# Patient Record
Sex: Male | Born: 1940
Health system: Southern US, Community
[De-identification: ages and names within clinical notes are randomized; demographics above are authoritative.]

## PROBLEM LIST (undated history)

## (undated) DIAGNOSIS — M1990 Unspecified osteoarthritis, unspecified site: Secondary | ICD-10-CM

## (undated) DIAGNOSIS — M2550 Pain in unspecified joint: Secondary | ICD-10-CM

## (undated) DIAGNOSIS — I6529 Occlusion and stenosis of unspecified carotid artery: Secondary | ICD-10-CM

## (undated) DIAGNOSIS — I10 Essential (primary) hypertension: Secondary | ICD-10-CM

## (undated) DIAGNOSIS — I739 Peripheral vascular disease, unspecified: Secondary | ICD-10-CM

## (undated) DIAGNOSIS — I2510 Atherosclerotic heart disease of native coronary artery without angina pectoris: Secondary | ICD-10-CM

## (undated) DIAGNOSIS — IMO0002 Reserved for concepts with insufficient information to code with codable children: Secondary | ICD-10-CM

## (undated) DIAGNOSIS — E785 Hyperlipidemia, unspecified: Secondary | ICD-10-CM

## (undated) HISTORY — DX: Occlusion and stenosis of unspecified carotid artery: I65.29

## (undated) HISTORY — PX: FOOT SURGERY: SHX648

## (undated) HISTORY — DX: Pain in unspecified joint: M25.50

## (undated) HISTORY — DX: Peripheral vascular disease, unspecified: I73.9

## (undated) HISTORY — DX: Essential (primary) hypertension: I10

## (undated) HISTORY — DX: Atherosclerotic heart disease of native coronary artery without angina pectoris: I25.10

## (undated) HISTORY — PX: AMPUTATION: SHX166

## (undated) HISTORY — DX: Reserved for concepts with insufficient information to code with codable children: IMO0002

## (undated) HISTORY — DX: Unspecified osteoarthritis, unspecified site: M19.90

## (undated) HISTORY — DX: Hyperlipidemia, unspecified: E78.5

## (undated) HISTORY — PX: APPENDECTOMY: SHX54

---

## 1998-11-04 HISTORY — PX: CAROTID ENDARTERECTOMY: SUR193

## 2005-07-05 HISTORY — PX: PR VEIN BYPASS GRAFT,AORTO-FEM-POP: 35551

## 2005-07-16 ENCOUNTER — Inpatient Hospital Stay (HOSPITAL_COMMUNITY): Admission: RE | Admit: 2005-07-16 | Discharge: 2005-07-29 | Payer: Self-pay | Admitting: Vascular Surgery

## 2005-07-22 ENCOUNTER — Encounter (INDEPENDENT_AMBULATORY_CARE_PROVIDER_SITE_OTHER): Payer: Self-pay | Admitting: *Deleted

## 2006-09-20 IMAGING — CR DG CHEST 2V
2 series · 2 of 2 positions shown · non-contrast
Comparison: None.

CLINICAL DATA: Peripheral vascular disease.  Former smoker with 30 pack/year history.
 CHEST - 2 VIEW:

[view not recorded (1 of 2)]
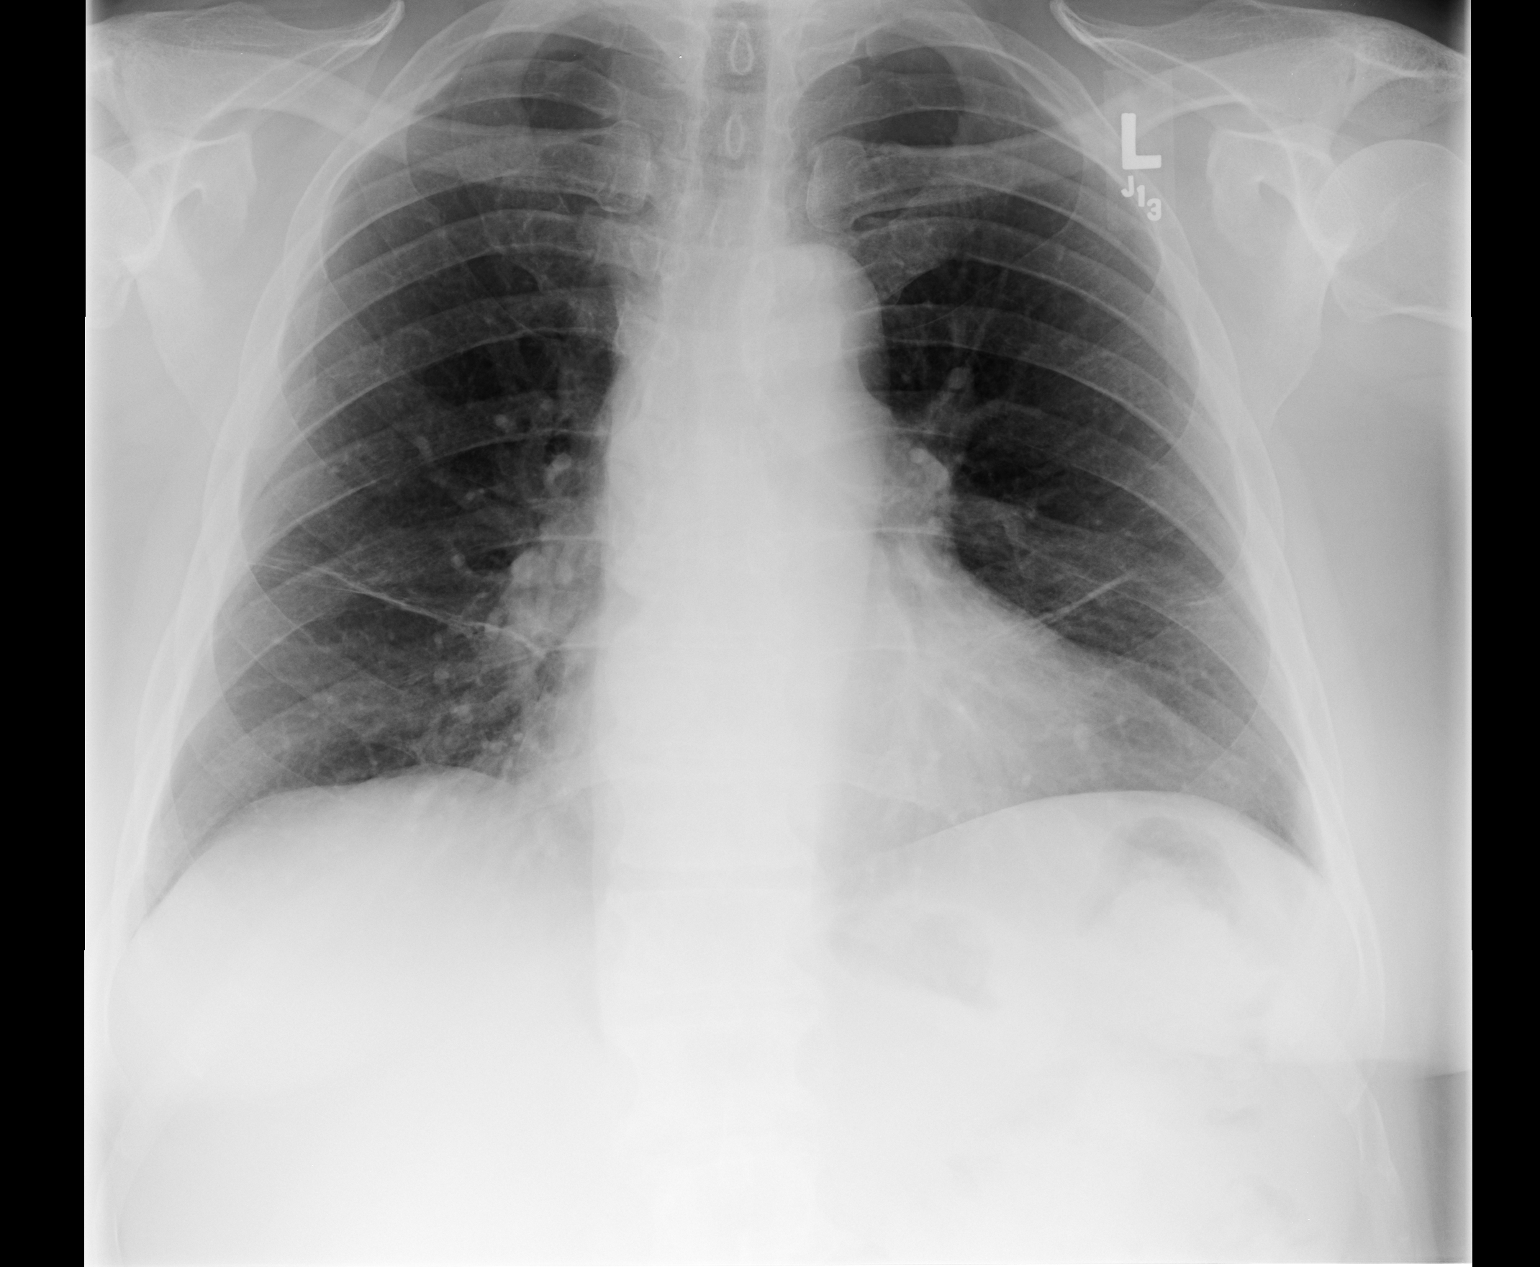

[view not recorded (2 of 2)]
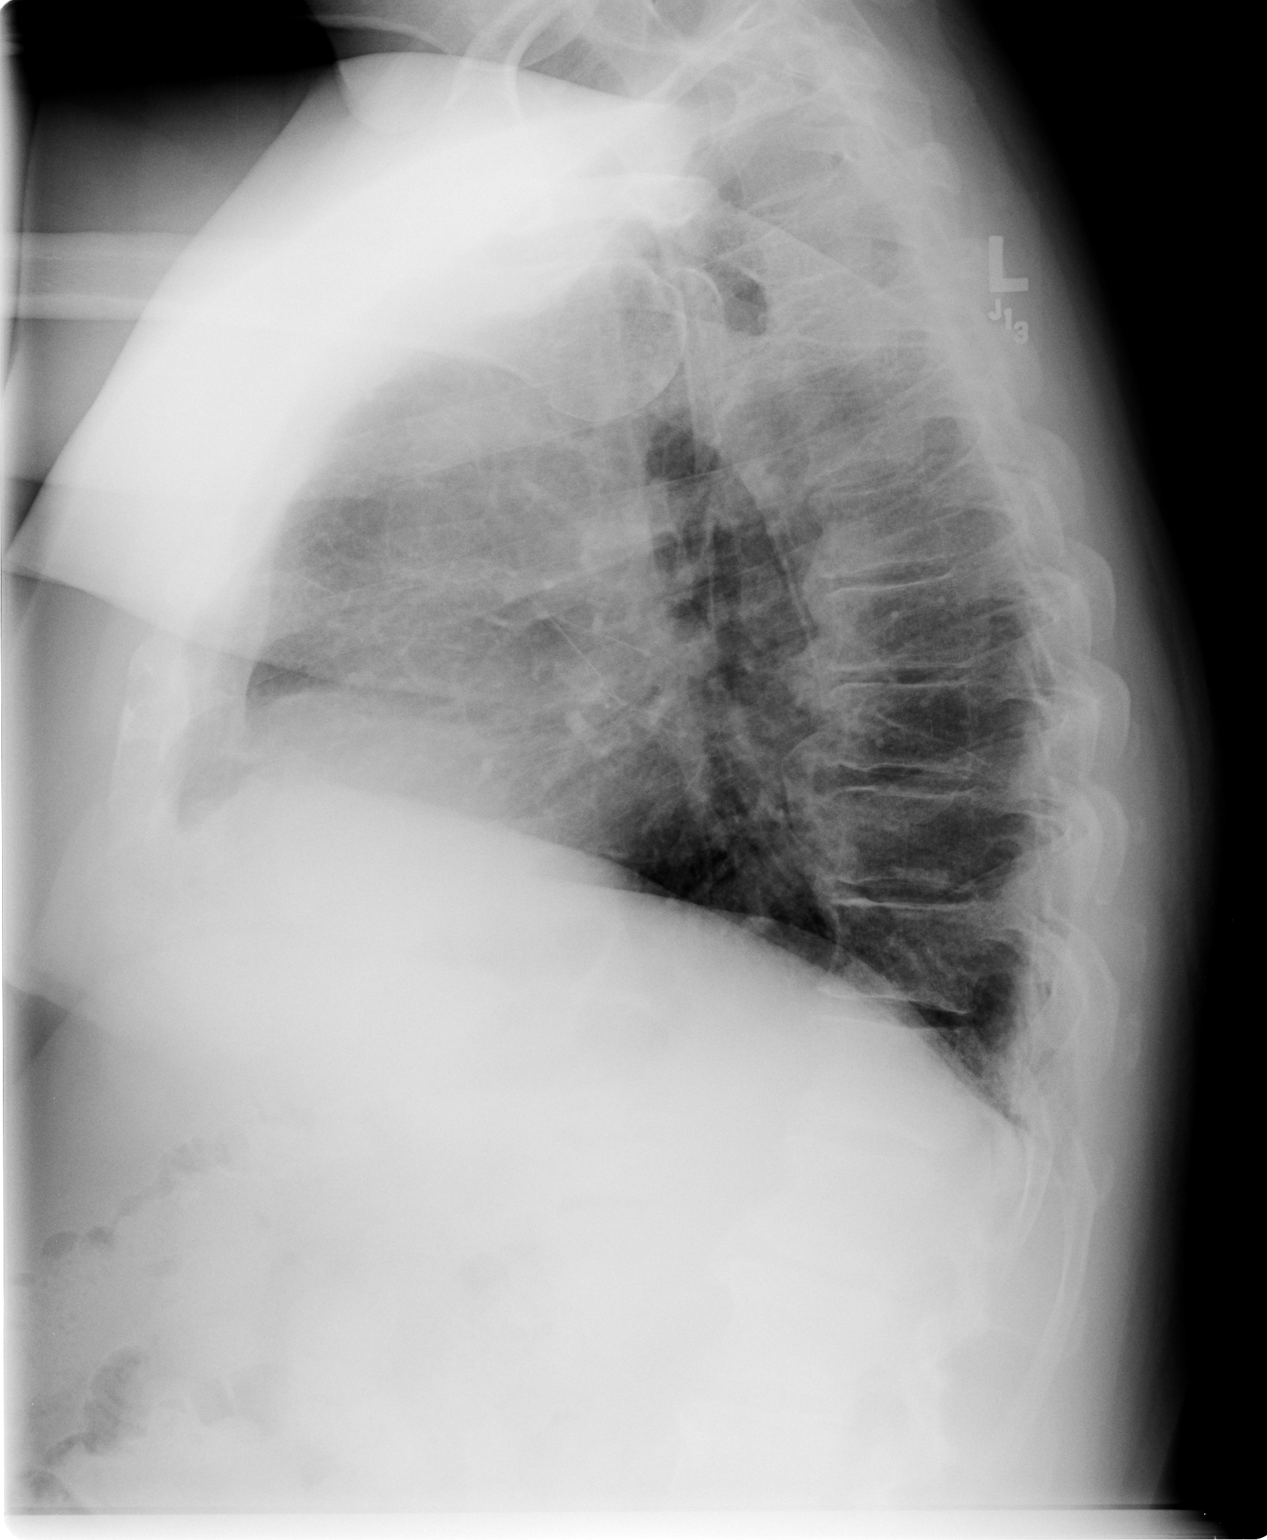

[2 of 2 positions shown; findings below may reference images not displayed]

FINDINGS: There is mild to moderate cardiomegaly.  No effusions and no pulmonary edema are noted.  Bilateral scar versus subsegmental atelectasis.  No air space opacities are noted.
IMPRESSION: 1.  Cardiomegaly ? no congestive heart failure.
 2.  Bilateral scar versus subsegmental atelectasis.

## 2007-05-04 ENCOUNTER — Ambulatory Visit: Payer: Self-pay | Admitting: Vascular Surgery

## 2007-07-15 ENCOUNTER — Ambulatory Visit: Payer: Self-pay | Admitting: Vascular Surgery

## 2008-08-17 ENCOUNTER — Ambulatory Visit: Payer: Self-pay | Admitting: Vascular Surgery

## 2008-08-24 ENCOUNTER — Ambulatory Visit: Payer: Self-pay | Admitting: Vascular Surgery

## 2008-08-26 ENCOUNTER — Ambulatory Visit (HOSPITAL_COMMUNITY): Admission: RE | Admit: 2008-08-26 | Discharge: 2008-08-26 | Payer: Self-pay | Admitting: Vascular Surgery

## 2008-08-26 ENCOUNTER — Ambulatory Visit: Payer: Self-pay | Admitting: Vascular Surgery

## 2008-09-01 ENCOUNTER — Encounter: Payer: Self-pay | Admitting: Vascular Surgery

## 2008-09-01 ENCOUNTER — Inpatient Hospital Stay (HOSPITAL_COMMUNITY): Admission: RE | Admit: 2008-09-01 | Discharge: 2008-09-03 | Payer: Self-pay | Admitting: Vascular Surgery

## 2008-09-02 ENCOUNTER — Encounter: Payer: Self-pay | Admitting: Vascular Surgery

## 2008-10-05 ENCOUNTER — Ambulatory Visit: Payer: Self-pay | Admitting: Vascular Surgery

## 2009-01-04 ENCOUNTER — Ambulatory Visit: Payer: Self-pay | Admitting: Vascular Surgery

## 2009-08-01 ENCOUNTER — Ambulatory Visit: Payer: Self-pay | Admitting: Vascular Surgery

## 2009-11-01 IMAGING — CR DG CHEST 2V
2 series · 2 of 2 positions shown · non-contrast
Comparison: 07/15/2005

CLINICAL DATA: Peripheral vascular disease.  Preoperative chest
radiograph.

CHEST - 2 VIEW

[view not recorded (1 of 2)]
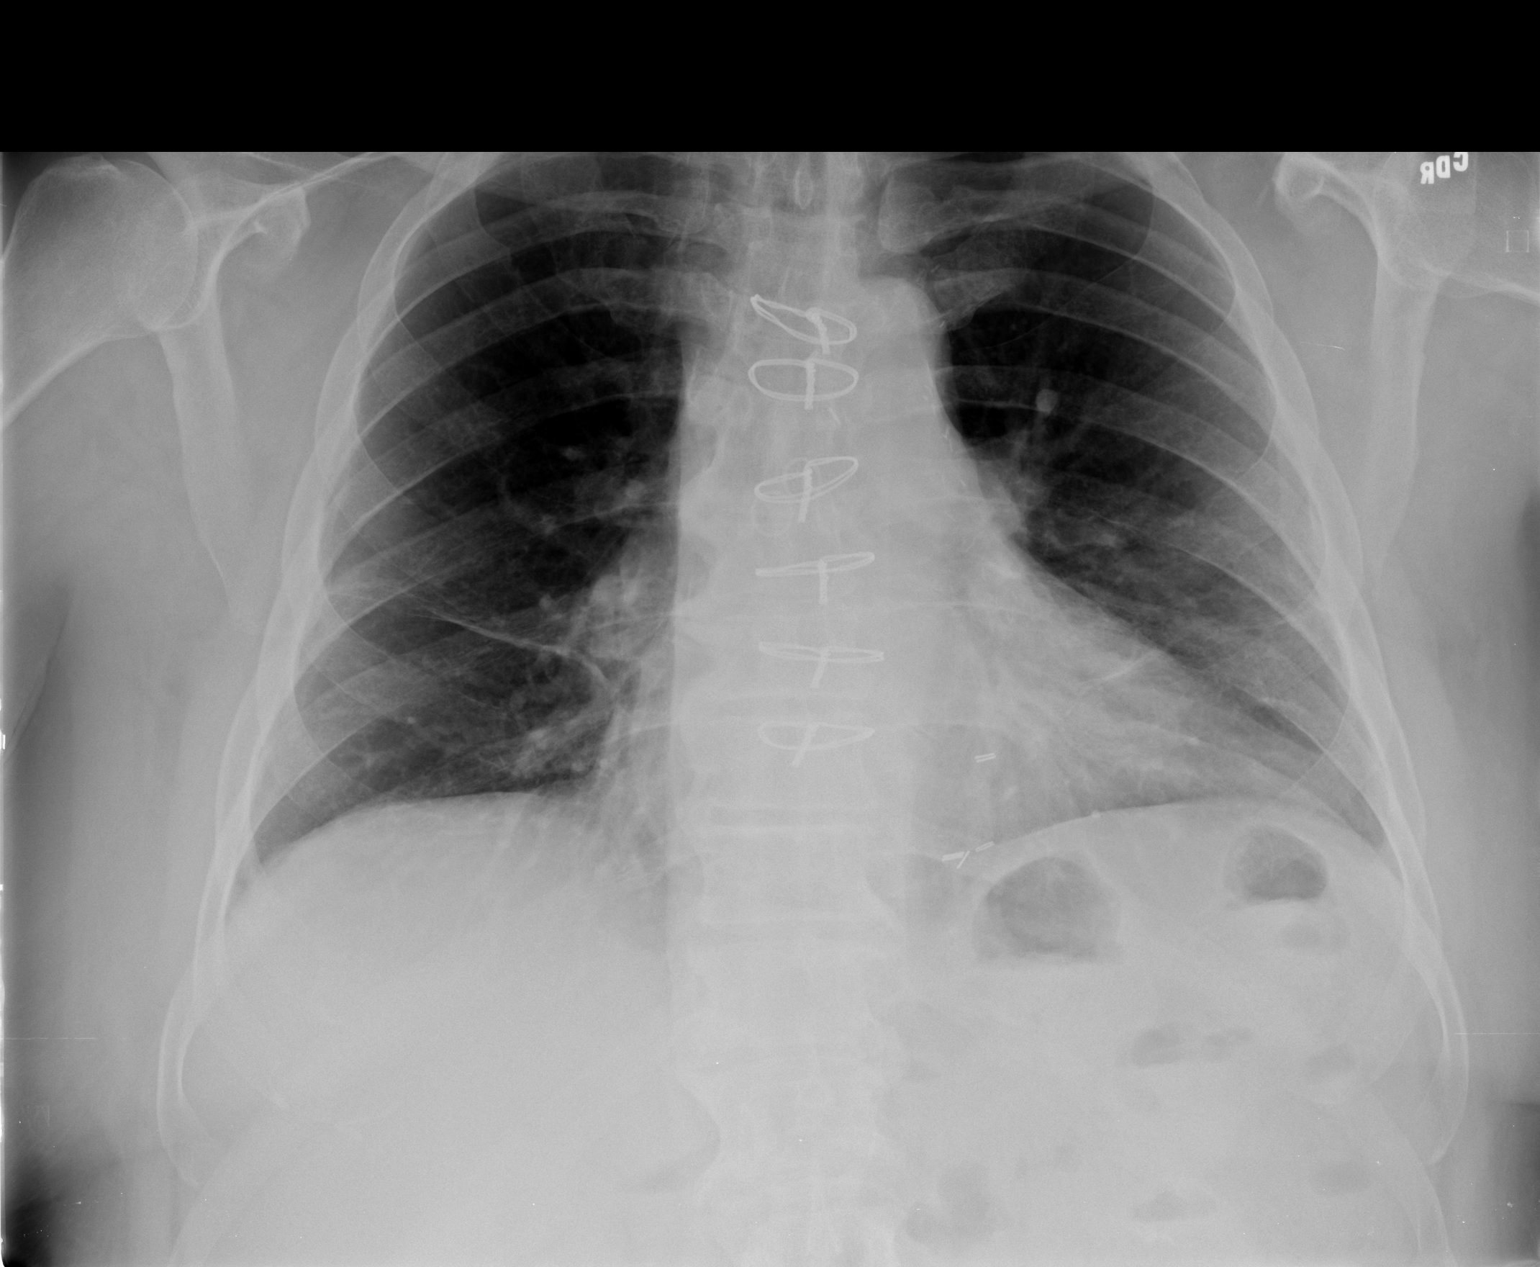

[view not recorded (2 of 2)]
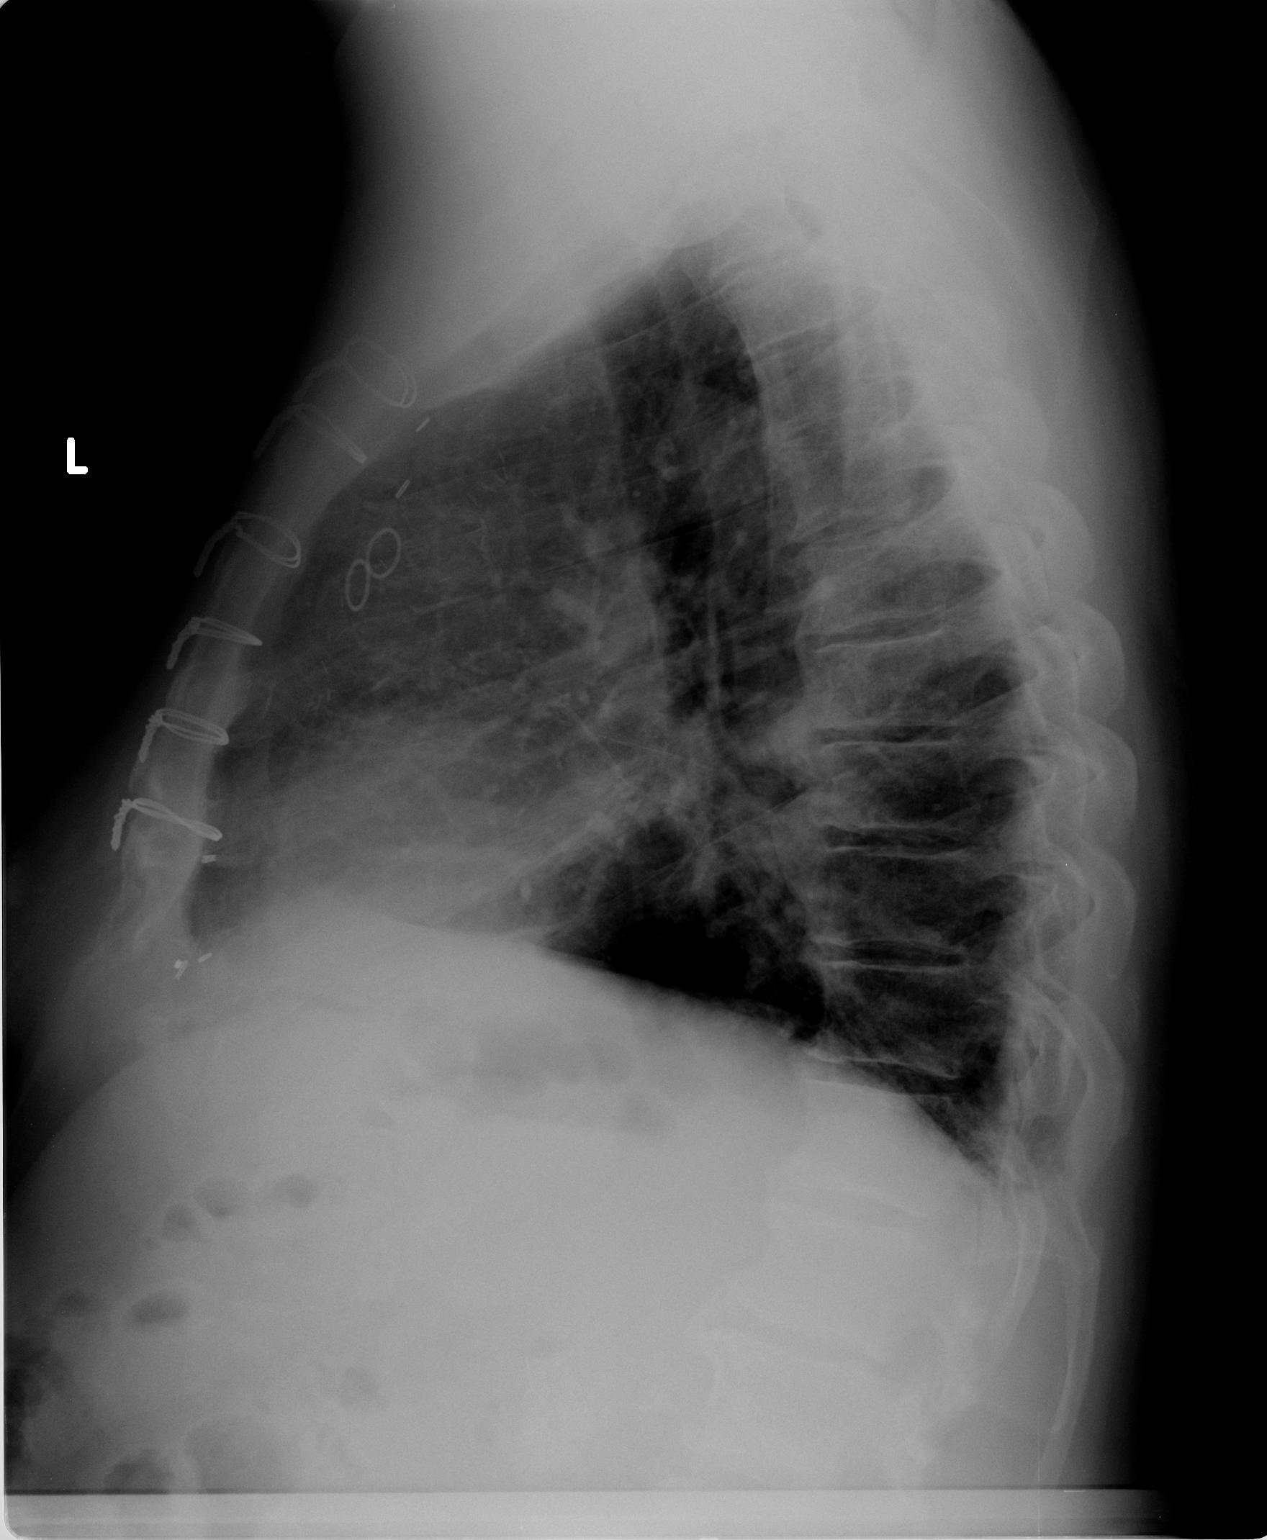

[2 of 2 positions shown; findings below may reference images not displayed]

FINDINGS: CABG/median sternotomy.  Bilateral basilar scarring.  No
change from prior examination.  Mild cardiomegaly.  No edema or
failure.  No airspace disease or effusions.  Surgical clips over
the left anterior chest, possibly related to harvesting of left
internal mammary artery. Diffuse idiopathic skeletal hyperostosis
of the thoracic spine.
IMPRESSION: 1.  Mild cardiomegaly.
2.  Chronic scarring in the lung bases.
3.  No acute cardiopulmonary disease.

## 2010-01-10 ENCOUNTER — Ambulatory Visit: Payer: Self-pay | Admitting: Vascular Surgery

## 2010-08-22 ENCOUNTER — Ambulatory Visit: Payer: Self-pay | Admitting: Vascular Surgery

## 2011-03-19 NOTE — Procedures (Signed)
CAROTID DUPLEX EXAM   INDICATION:  Follow-up evaluation of known carotid artery disease.   HISTORY:  Diabetes:  Yes.  Cardiac:  Coronary artery bypass graft in 09/2006.  Hypertension:  Medically controlled.  Smoking:  Former smoker.  Previous Surgery:  Left carotid endarterectomy in 2000 in [place].  CV History:  Previous duplex performed on 05/04/2007 revealed 20-39%  right ICA stenosis and no left ICA stenosis, status post endarterectomy.  Amaurosis Fugax No, Paresthesias No, Hemiparesis No.                                       RIGHT             LEFT  Brachial systolic pressure:         198               196  Brachial Doppler waveforms:         Triphasic         Triphasic  Vertebral direction of flow:        Antegrade         Antegrade  DUPLEX VELOCITIES (cm/sec)  CCA peak systolic                   82                81  ECA peak systolic                   119               [id number]  ICA peak systolic                   112               86  ICA end diastolic                   27                19  PLAQUE MORPHOLOGY:                  Calcified, irregular                Mixed  PLAQUE AMOUNT:                      Mild-to-moderate  Mild  PLAQUE LOCATION:                    Proximal ICA      ECA   IMPRESSION:  1. 20-39% right internal carotid artery stenosis.  2. No left internal carotid artery stenosis, status post      endarterectomy.   ___________________________________________  [name] Fields, MD   MC/MEDQ  D:  08/17/2008  T:  08/17/2008  Job:  [id number]

## 2011-03-19 NOTE — Assessment & Plan Note (Signed)
OFFICE VISIT   [name], [name]  DOB:  11-26-1940                                       07/15/2007  CHART#:[id number]   Patient returns for followup today after his left superficial femoral  artery to dorsalis pedis bypass and amputation of his first toe.  He  continues to do well.  He has had no problems with his foot since his  last visit in September, 2007.  He has also previously had a left  carotid endarterectomy and has had no symptoms of TIA, amaurosis, or  stroke.  Of note, in the past year, he did have coronary artery bypass  grafting by Dr. Jerelene Redden and recovered well from this.   PHYSICAL EXAMINATION:  On physical exam today, blood pressure is 200/84.  Heart rate is 76 and regular.  He has a 2+ graft pulse from the knee all  the way down to the dorsum of the foot.   Recent ABIs in June, 2008 were 0.98 on the left and 0.8 on the right.  He did still have some increased velocities in the graft of 257 cm/sec,  proximal to the proximal anastomosis and 325 cm/sec in the distal  segment.  However, the pulse is very good throughout the graft and do  not believe he just warrants arteriography at this point and this has  been stable for over a year.  His toe amputation site is well healed.  Overall, patient is doing well.  He will continue to follow up in our  surveillance protocol.  He will return to see me in six months time.  He  probably needs a carotid duplex in about 5-6 years.   [name]. Fields, MD  Electronically Signed   CEF/MEDQ  D:  07/15/2007  T:  07/16/2007  Job:  [id number]   cc:   Dr. Carlus Pavlov

## 2011-03-19 NOTE — Assessment & Plan Note (Signed)
OFFICE VISIT   [name], [name]  DOB:  Dec 13, 1940                                       08/24/2008  CHART#:[id number]   The patient returns for follow-up today.  He previously underwent a left  first toe amputation and left superficial femoral to dorsalis pedis  artery bypass graft in September 2006.  He was noted to have increased  velocities in his left superficial femoral artery showed evidence of  stenosis in the superficial femoral artery above the proximal  anastomosis with a peak systolic velocity of 123XX123 cm/s.  His ABIs on that  exam were 0.98 on the left and 0.80 on the right.  The patient denies  any symptoms in his foot.  He has no numbness, tingling, no evidence of  nonhealing wounds.   MEDICATIONS:  Glyburide/metformin 5/500 twice a day, carvedilol 12.5 mg  twice a day, lisinopril 40 mg once a day, Crestor 40 mg once a day,  aspirin 325 mg once a day.   His atherosclerotic risk factors continue to include diabetes and former  smoking history but he quit 15 years ago.  He also has a history of  hypertension and elevated cholesterol.   FAMILY HISTORY:  Unremarkable.   SOCIAL HISTORY:  He is recently retired.  He does not consume alcohol  regularly.   PAST SURGICAL HISTORY:  He had a bunionectomy of his left first toe.  He  also had appendectomy and left carotid endarterectomy 6 years ago.  Carotid duplex exam 05/04/2007 showed no evidence of restenosis on the  left and a less than 40% right internal carotid artery stenosis.   PHYSICAL EXAMINATION:  Vitals:  BP 184/90 in the left arm, 209/82 in the  right arm, pulse 63 and regular.  HEENT:  Unremarkable.  He has 2+  carotid pulses without bruit.  Chest:  Clear to auscultation.  Cardiac  Exam:  Regular rate and rhythm.  Abdomen:  Soft, nontender without mass.  Extremities:  He has a 2+ dorsalis pedis pulse in the left foot, he has  a palpable graft pulse throughout the left lower extremity.  He  has 2+  femoral pulses bilaterally.  Right foot is pink, warm and well-perfused.  He has a 2+ right popliteal pulse.   I discussed with the patient today that we should do an arteriogram  because these high increase in velocities could indicate that he has a  high-grade stenosis above or involving his bypass graft.  We have  scheduled his arteriogram for October 23rd.  Also discussed with him the  possibility of angioplasty and stenting, or possible surgical revision  may be required.  Risks, benefits, possible complications and procedure  details were explained to the patient including, but not limited to,  bleeding, infection, contrast reaction, arterial injury; he understands  and agrees to proceed.  This is scheduled for 08/26/2008. I also  discussed with the patient that he should stop his metformin, with the  last dose being taking on Thursday morning and no further her doses  until Monday after the test.   [name]. Fields, MD  Electronically Signed   CEF/MEDQ  D:  08/24/2008  T:  08/25/2008  Job:  [id number]

## 2011-03-19 NOTE — Op Note (Signed)
NAMETYBERIUS, HLADKY NO.:  [id number]   MEDICAL RECORD NO.:  [id number]          PATIENT TYPE:  INP   LOCATION:  2024                         FACILITY:  [facility]   PHYSICIAN:  [name]. Fields, MD  DATE OF BIRTH:  14-May-1941   DATE OF PROCEDURE:  09/01/2008  DATE OF DISCHARGE:                               OPERATIVE REPORT   PROCEDURES:  1. Left superficial femoral artery endarterectomy.  2. Patch angioplasty, left superficial femoral artery with bovine      pericardial patch.   PREOPERATIVE DIAGNOSIS:  High-grade left superficial femoral artery  stenosis.   POSTOPERATIVE DIAGNOSIS:  High-grade left superficial femoral artery  stenosis.   ANESTHESIA:  General.   ASSISTANT:  Chad Cordial, PA-C   INDICATIONS:  The patient is a 70 year old male with prior history of a  left superficial femoral artery to dorsalis pedis artery bypass several  years ago.  On routine graft surveillance, he was noted to have a high-  grade stenosis of his left superficial femoral artery just above the  level of the anastomosis.  Preoperative arteriogram also confirmed that  he had a high-grade left superficial femoral artery stenosis and there  was also a high-grade stenosis of the proximal aspect of the vein graft.  He presents for revision of his bypass graft in order to preserve  patency of the bypass.   OPERATIVE FINDINGS:  1. Multiple high-grade stenoses in areas of left distal superficial      femoral artery.  2. Bovine pericardial patch, left superficial femoral artery.   OPERATIVE DETAILS:  After obtaining informed consent, the patient was  taken to the operating.  The patient was placed in supine position upon  the operating table.  After induction of general anesthesia and  endotracheal intubation, a Foley catheter was placed.  Next, the  patient's left and right lower extremities were prepped and draped in  the usual sterile fashion all the way to the toes.  A  longitudinal  incision was then made through a preexisting scar on the medial aspect  of the left thigh.  Incision was carried down through subcutaneous  tissues and a preexisting bypass graft was encountered.  This was  pulsatile in character.  The vein graft was dissected free  circumferentially and dissection was carried all the way up to the level  of the native superficial femoral artery vein graft anastomosis.  Superficial femoral artery was dissected free circumferentially above  and below the level of the anastomosis.  The distal superficial femoral  artery below the vein graft was palpated and found to be thickened with  atherosclerosis.  There was no pulse within this and presumably  occluded.  A vessel loop was placed around it for vascular control.  Next, dissection was carried out around the native left superficial  femoral artery.  There was a large amount of atherosclerotic plaque  within this consistent with arteriographic findings.  This was dissected  free up above the level of the adductor hiatus and up to a more normal  soft appearing and normal feeling  superficial femoral artery  approximately 10 cm above the area of the proximal anastomosis for the  vein graft.  Next, the patient was given 7000 units of intravenous  heparin.  The distal superficial femoral artery was controlled with a  vessel loop.  Proximal superficial femoral artery was controlled with  peripheral DeBakey clamp.  Several side branches were ligated and  divided between silk ties along the course of superficial femoral  artery.  The vein graft was controlled with a fine bulldog clamp.  Next,  a longitudinal opening was made in the hood of the vein graft and  extended down onto the superficial femoral artery.  There was a high-  grade stenosis of the proximal anastomosis with intimal hyperplasia and  there was atherosclerotic plaque extending into the superficial femoral  artery.  This longitudinal  opening was extended for approximately 10 cm  into the superficial femoral artery.  There are several areas of high-  grade stenosis and plaque ulceration.  At this point, a endarterectomy  was begun in the superficial femoral artery and a suitable plane.  This  was carried all the way down to the level of the bypass graft  anastomosis.  Plaque was removed and sent as a specimen.  A good clean  proximal endpoint was obtained.  A good distal endpoint was also  obtained.  Next, a bovine pericardial patch was brought up in the  operative field.  The distal end of this was sewn on as a patch  angioplasty extending onto the hood of the proximal anastomosis of the  bypass graft.  The patch was then extended up onto the native  superficial femoral artery.  Just prior to completion of anastomosis,  this was fore bled, back bled, and thoroughly flushed.  Anastomosis was  secured and clamps were released.  There was good pulsatile flow in the  native superficial femoral artery and into the bypass graft and also at  the level of the foot and the bypass graft in the left leg.  Next,  hemostasis was obtained with full reversal of the heparin with 70 mg of  protamine.  Thrombin and Gelfoam was also used for assistance in  hemostasis.  Next, the deep layers were closed with multiple layers of  running 2-0 and 3-0 Vicryl suture.  After, 4-0 Vicryl subcuticular  stitch was placed in the skin.  The patient tolerated the procedure well  and there were no complications.  Instrument, sponge, and needle counts  were correct at the end of the case.  The patient was taken to recovery  room in stable condition.      [name]. Fields, MD  Electronically Signed     CEF/MEDQ  D:  09/02/2008  T:  09/03/2008  Job:  [id number]

## 2011-03-19 NOTE — Procedures (Signed)
BYPASS GRAFT EVALUATION   INDICATION:  Follow-up evaluation of left leg bypass graft.   HISTORY:  Diabetes:  Yes.  Cardiac:  Coronary artery bypass graft in 09/2006.  Hypertension:  Medically controlled.  Smoking:  Patient is a former smoker.  Previous Surgery:  Left superficial femoral artery to dorsalis pedis  bypass graft with nonreversed saphenous vein on 07/17/2005 by Dr.  Oneida Alar.   SINGLE LEVEL ARTERIAL EXAM                               RIGHT              LEFT  Brachial:                    198                196  Anterior tibial:             178                188  Posterior tibial:            92  Peroneal:                                       148  Ankle/brachial index:        0.89               0.95   PREVIOUS ABI:  Date: 05/04/07  RIGHT:  0.80  LEFT:  0.98   LOWER EXTREMITY BYPASS GRAFT DUPLEX EXAM:   DUPLEX:  Doppler arterial waveforms are biphasic proximal to, within,  and distal to the superficial femoral to DP bypass graft.  There is an  elevated peak systolic velocity in the superficial femoral artery  proximal to the anastomosis of 508 cm/s.   IMPRESSION:  1. Ankle brachial indices are stable from previous study bilaterally.  2. Patent left superficial femoral to dorsalis pedis artery bypass      graft.  3. Left superficial femoral artery stenosis just proximal to the      anastomosis of the graft.   ___________________________________________  [name]. Fields, MD   MC/MEDQ  D:  08/17/2008  T:  08/17/2008  Job:  [id number]

## 2011-03-19 NOTE — Procedures (Signed)
BYPASS GRAFT EVALUATION   INDICATION:  Left lower extremity bypass graft.   HISTORY:  Diabetes:  Yes.  Cardiac:  CABG.  Hypertension:  Yes.  Smoking:  Previous.  Previous Surgery:  Left mid superficial femoral to dorsalis pedis bypass  graft on 07/17/2005, left superficial femoral artery endarterectomy on  09/01/2008.   SINGLE LEVEL ARTERIAL EXAM                               RIGHT              LEFT  Brachial:                    205                203  Anterior tibial:             162                212  Posterior tibial:            175                209  Peroneal:  Ankle/brachial index:        0.85               1.03   PREVIOUS ABI:  Date: 01/10/2010  RIGHT:  0.9  LEFT:  1.15   LOWER EXTREMITY BYPASS GRAFT DUPLEX EXAM:   DUPLEX:  Biphasic Doppler waveforms noted throughout the left lower  extremity bypass graft in its native vessels with mildly increased  velocity of 210 cm/s noted at the left distal bypass graft level near  the ankle.   IMPRESSION:  1. Patent left superficial femoral to dorsalis pedis artery bypass      graft and superficial femoral artery endarterectomy site with      increased velocity noted, as described above.  2. Stable bilateral ankle brachial indices.   ___________________________________________  [name] Fields, MD   CH/MEDQ  D:  08/22/2010  T:  08/22/2010  Job:  [id number]

## 2011-03-19 NOTE — Assessment & Plan Note (Signed)
OFFICE VISIT   [name], [name]  DOB:  04/15/1941                                       10/05/2008  CHART#:[id number]   The patient returns for follow-up today.  He underwent endarterectomy of  his left superficial femoral artery on October 29th.  This was done  above an area of previous SFA to dorsalis pedis artery bypass.  On exam  today, the incision is well-healed.  He has an easily palpable 2+  dorsalis pedis pulse in the foot.  Overall, he appears to be doing well.  He will follow up in our graft surveillance protocol.   [name]. Fields, MD  Electronically Signed   CEF/MEDQ  D:  10/12/2008  T:  10/13/2008  Job:  [id number]

## 2011-03-19 NOTE — Procedures (Signed)
BYPASS GRAFT EVALUATION   INDICATION:  Followup left lower extremity revascularization.   HISTORY:  Diabetes:  Yes.  Cardiac:  CABG.  Hypertension:  Yes.  Smoking:  Quit.  Previous Surgery:  Left SFA-DPA bypass graft on 07/17/2005 by Dr.  Oneida Alar.  Left SFA endarterectomy with BPP 09/01/2008 by Dr. Oneida Alar.   SINGLE LEVEL ARTERIAL EXAM                               RIGHT              LEFT  Brachial:                    162                163  Anterior tibial:             145                189  Posterior tibial:            146                186  Peroneal:  Ankle/brachial index:        0.90               1.16   PREVIOUS ABI:  Date:  10/05/2008  RIGHT:  0.92  LEFT:  1.04   LOWER EXTREMITY BYPASS GRAFT DUPLEX EXAM:   DUPLEX:  Patent left superficial femoral artery to dorsalis pedis artery  bypass graft and SFA endarterectomy site.  Elevated velocities in focal area of distal graft appears stable from  previous studies.   IMPRESSION:  1. Stable ABIs bilaterally.  2. Patent left SFA to DPA bypass graft as well as SFA endarterectomy      site.       ___________________________________________  [name]. Fields, MD   AS/MEDQ  D:  01/04/2009  T:  01/04/2009  Job:  [id number]

## 2011-03-19 NOTE — Procedures (Signed)
CAROTID DUPLEX EXAM   INDICATION:  Follow up carotid artery disease.   HISTORY:  Diabetes:  Yes  Cardiac:  CABG November 2007  Hypertension:  On medications.  Smoking:  Quit.  Previous Surgery:  Status post left CVA.  CV History:  No.  Amaurosis Fugax: Paresthesias No, Hemiparesis No   TECHNOLOGIST:  Marcos Eke, BS, RVT.   INTERPRETING DOCTOR:  [name]. Fields, MD                                       RIGHT               LEFT  Brachial systolic pressure:         169                 184  Brachial Doppler waveforms:         Biphasic.           Biphasic.  Vertebral direction of flow:        Antegrade.          Antegrade.  DUPLEX VELOCITIES (cm/sec)  CCA peak systolic                   72.                 88.  ECA peak systolic                   154.                176.  ICA peak systolic                   88.                 75.  ICA end diastolic                   25.                 24.  PLAQUE MORPHOLOGY:                  Calcified.          Mixed.  PLAQUE AMOUNT:                      Mild.               Mild.  PLAQUE LOCATION:                    ICA/ECA/bifurcation.                  ECA.   IMPRESSION:  The right ICA shows evidence of 20-39% stenosis.   The left ICA shows no evidence of restenosis status post CEA.   Bilateral minimal ECA stenosis.   No significant changes from previous study.   ___________________________________________  [name] Oneida Alar, MD   AS/MEDQ  D:  05/04/2007  T:  05/04/2007  Job:  [id number]

## 2011-03-19 NOTE — Procedures (Signed)
BYPASS GRAFT EVALUATION   INDICATION:  Follow up left lower extremity bypass graft.   HISTORY:  Diabetes:  Yes.  Cardiac:  CABG.  Hypertension:  Yes.  Smoking:  Previous.  Previous Surgery:  Left SFA to DPA bypass graft on 07/17/05 by Dr.  Oneida Alar.  Left SFA endarterectomy, 09/01/08 by Dr. Oneida Alar.   SINGLE LEVEL ARTERIAL EXAM                               RIGHT              LEFT  Brachial:                    175                163  Anterior tibial:             142                201  Posterior tibial:            158                164  Peroneal:  Ankle/brachial index:        0.9                1.15   PREVIOUS ABI:  Date: 08/01/09  RIGHT:  0.93  LEFT:  1.16   LOWER EXTREMITY BYPASS GRAFT DUPLEX EXAM:   DUPLEX:  1. Patent left SFA to DPA bypass graft and SFA endarterectomy site.  2. Focal area of distal graft with elevated velocities.  Have      increased to 327 cm/s today.   IMPRESSION:  1. Patent left superficial femoral artery to dorsalis pedis artery      bypass graft as well as superficial femoral artery endarterectomy      site.  2. Stable ankle brachial indices bilaterally.   Dr. Oneida Alar informed of velocity changes and stated to just keep on 6-  month protocol.   ___________________________________________  [name]. Fields, MD   AS/MEDQ  D:  01/10/2010  T:  01/10/2010  Job:  [id number]

## 2011-03-19 NOTE — Procedures (Signed)
BYPASS GRAFT EVALUATION   INDICATION:  Follow up left lower extremity bypass graft.   HISTORY:  Diabetes:  Yes  Cardiac:  CABG  Hypertension:  Yes  Smoking:  Previous  Previous Surgery:  Left SFA to DPA bypass graft on 07/17/2005 by Dr.  Oneida Alar.  Left SFA endarterectomy 09/01/2008 by Dr. Oneida Alar.   SINGLE LEVEL ARTERIAL EXAM                               RIGHT              LEFT  Brachial:                    184                181  Anterior tibial:             166                213  Posterior tibial:            172                202  Peroneal:  Ankle/brachial index:        0.93               1.16   PREVIOUS ABI:  Date: 01/04/2009  RIGHT:  0.90  LEFT:  1.16   LOWER EXTREMITY BYPASS GRAFT DUPLEX EXAM:   DUPLEX:  1. Patent left superficial femoral artery to dorsalis pedis artery      bypass graft and SFA endarterectomy site.  2. Stable elevated velocities in focal area of distal graft of 247      cm/s.   IMPRESSION:  1. Patent left superficial femoral artery to dorsalis pedis artery      bypass graft as well as superficial femoral artery endarterectomy      site.  2. Stable ankle-brachial indices bilaterally.  3. No significant changes from previous study.   ___________________________________________  [name] Fields, MD   AS/MEDQ  D:  08/01/2009  T:  08/02/2009  Job:  [id number]

## 2011-03-19 NOTE — Op Note (Signed)
NAMEGLENDA, MCLOONE                   ACCOUNT NO.:  [id number]   MEDICAL RECORD NO.:  [id number]          PATIENT TYPE:  AMB   LOCATION:  SDS                          FACILITY:  [facility]   PHYSICIAN:  Charles E. Fields, MD  DATE OF BIRTH:  1941/06/06   DATE OF PROCEDURE:  08/26/2008  DATE OF DISCHARGE:  08/26/2008                               OPERATIVE REPORT   PROCEDURE:  Aortogram with left lower extremity runoff.   PREOPERATIVE DIAGNOSIS:  Left superficial femoral artery stenosis.   POSTOPERATIVE DIAGNOSIS:  Left superficial femoral artery stenosis.   ANESTHESIA:  Local.   OPERATIVE DETAILS:  After obtaining informed consent, the patient taken  to the [facility] lab.  The patient placed in supine position on the angio  table.  Both groins were prepped and draped in usual sterile fashion.  Local anesthesia was infiltrated over the right common femoral artery.  Attempt was made to cannulate the right common femoral artery.  The  guidewire would not advance initially, although there was good pulsatile  with return of blood flow.  This was examined under fluoroscopy and a  stick was found to be fairly low most likely in the superficial femoral  or profunda femoris artery.  Therefore, the needle was pulled back and  out and direct pressure was held for approximately 5 minutes over the  area of the needle stick site.  Next, an additional attempt was made to  cannulate the right common femoral artery with marking of the femoral  head prior to this.  I was able to successfully cannulate the right  common femoral artery and a 0.035 Wholey wire was advanced into the  abdominal aorta under fluoroscopic guidance.  Next, a 5-French sheath  was placed over the guidewire in the right common femoral artery.  This  was thoroughly flushed with heparinized saline.  A 5-French pigtail  catheter was then placed over the guidewire into the abdominal aorta and  abdominal aortogram was obtained.  This shows  bilateral duplicated renal  arteries with no significant stenosis.  There are actually three renal  arteries to the right kidney and there are two renal arteries to the  left kidney.  There is no significant stenosis in any of these.   Infrarenal abdominal aorta was without flow-limiting stenosis.  The left  and right common iliac arteries are without flow-limiting stenosis.  Left and right internal, external iliac arteries are without flow-  limiting stenosis.  Next, a pigtail catheter was pulled back over a  guidewire.  A 5-French crossover catheter was then placed into the  abdominal aorta and this was used to selectively catheterize the left  common iliac artery.  A 0.035 angled Glidewire was then advanced down  into the distal external iliac artery on the left side.  The crossover  catheter was pulled back over the guidewire and a 5-French end-hole  catheter placed over the guidewire into the distal left external iliac  artery.  Left lower extremity arteriogram was then obtained.  Also,  lateral foot view and an oblique view of the midthigh  of the left leg  was obtained.  This shows a patent left common femoral artery, profunda  femoris artery, and superficial femoral artery.  However, there is a  high-grade stenosis of the distal superficial femoral artery right above  the takeoff of the bypass graft which extends from the superficial  femoral artery to the dorsalis pedis artery.  The vein graft is widely  patent throughout its course.  The distal anastomosis is to the dorsalis  pedis artery at the top of the foot.  Distal anastomosis is widely  patent on the lateral foot view.   An oblique view of the thigh was obtained to further examine the native  superficial femoral artery.  Again, there is a 95% stenosis of the  superficial femoral artery just above the proximal anastomosis of the  bypass.  The more proximal superficial femoral artery approximately 10-  12 cm above the  anastomosis is relatively disease free.  The left common  femoral artery is also relatively disease free.   Next, the end-hole catheter was pulled back over a guidewire.  The  sheath was left in place to be pulled in the holding area.  The patient  tolerated the procedure well and there were no complications.  The  patient was taken to the holding area in stable condition.   OPERATIVE FINDINGS:  1. Aortoiliac occlusive disease.  2. Right renal arteries to the left renal arteries with no significant      stenosis.  3. Patent left superficial femoral to dorsalis pedis bypass __________      superficial femoral artery just proximal to the anastomosis.  4. 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      [name]. Fields, MD  Electronically Signed     CEF/MEDQ  D:  08/26/2008  T:  08/27/2008  Job:  [id number]

## 2011-03-19 NOTE — Procedures (Signed)
BYPASS GRAFT EVALUATION   INDICATION:  Follow up left lower extremity bypass graft.   HISTORY:  Diabetes:  Yes.  Cardiac:  CABG, November 2007.  Hypertension:  On medications.  Smoking:  Quit.  Previous Surgery:  Left superficial femoral artery to dorsalis pedis  bypass graft with a non-reversed saphenous vein on July 17, 2005 by  Dr. Oneida Alar.   AGE:  70.   TECHNOLOGIST:  Marcos Eke, BS, RVT   INTERPRETING DOCTOR:  Charles E. Oneida Alar, M.D.   SINGLE LEVEL ARTERIAL EXAM                               RIGHT              LEFT  Brachial:                    169                184  Anterior tibial:             126                180  Posterior tibial:            148                159  Peroneal:  Ankle/brachial index:        0.80               0.98   SINGLE LEVEL ARTERIAL EXAM:   PREVIOUS ABI:  Date:  July 25, 2006  RIGHT:  0.75  LEFT:  0.90   LOWER EXTREMITY BYPASS GRAFT DUPLEX EXAM:  See attached sheet for  velocities.   DUPLEX:  Velocities in the native superficial femoral artery proximal to  the graft, in the distal graft, and in the mid dorsalis pedis are  elevated.  They appear stable from previous studies.  Doppler arterial  waveforms appear biphasic and monophasic proximal to, throughout, and  distal to the graft.   IMPRESSION:  Stable left superficial femoral artery to dorsalis pedis  artery bypass graft.   ABIs are stable bilaterally.   ___________________________________________  [name] Oneida Alar, MD   AS/MEDQ  D:  05/04/2007  T:  05/04/2007  Job:  [id number]

## 2011-03-22 NOTE — Op Note (Signed)
[name], [name]                   ACCOUNT NO.:  [id number]   MEDICAL RECORD NO.:  [id number]          PATIENT TYPE:  OIB   LOCATION:  3305                         FACILITY:  [facility]   PHYSICIAN:  [name]. Fields, MD  DATE OF BIRTH:  [birth year]   DATE OF PROCEDURE:  07/17/2005  DATE OF DISCHARGE:                                 OPERATIVE REPORT   PROCEDURE:  Left superficial femoral to dorsalis pedis bypass with non  reversed greater saphenous vein.   PREOPERATIVE DIAGNOSIS:  Ischemia left leg.   POSTOPERATIVE DIAGNOSIS:  Ischemia left leg.   ANESTHESIA:  General.   SURGEON:  Ruta Hinds, M.D.   ASSISTANT:  Christa See, PA   INDICATIONS:  The patient is a 70 year old male with a six week history of  progressive worsening wound in the left foot and evidence of ischemia.   OPERATIVE FINDINGS:  1.  Left greater saphenous vein between 4 and 2 mm.  2.  Superficial femoral artery to dorsalis pedis bypass with non reversed      greater saphenous vein.   OPERATIVE DETAIL:  After obtaining informed consent, the patient was taken  to the operating room. The patient was placed in the supine position on the  operating table. After induction of general anesthesia and endotracheal  intubation, Foley catheter was placed. Next, both lower extremities were  prepped and draped in the usual sterile fashion. A longitudinal incision was  made over the left greater saphenous vein. The vein was harvested from the  saphenofemoral junction down to the mid portion of the calf. The vein was of  good quality down to the level of the calf. However, just below the knee,  the vein split into three branches and the largest of these was selected.  Small side branches were ligated and divided between silk ties. The vein was  then ligated distally and at the saphenofemoral junction oversewn with 5-0  Prolene suture. The vein was gently distended and inspected to make sure  that all side branches  were hemostatic. Next, the superficial femoral artery  was dissected free just above the knee right at the level of the adductor  hiatus. The dorsalis pedis artery was dissected free through a longitudinal  incision on the dorsum of the foot. The dorsalis pedis artery was soft in  character and approximately 4.5 mm in diameter. The patient was given 5000  units of intravenous heparin. Superficial femoral artery was clamped  proximally and distally with Henley clamps. Longitudinal arteriotomy was  made. There was some thrombus within the artery and this was removed. There  was excellent arterial inflow. There was minimal backbleeding from the  distal artery. Next, the vein was brought up on the operative field and  spatulated. It was then sewn in a non-reversed fashion in the vein to side  of artery using running 5-0 Prolene suture. Just prior to completion of the  anastomosis, this was forward bled, back bled, and thoroughly flushed.  Anastomosis was secured, clamps were released, and the vein was noted to  fill immediately. Next, a [employer]  valvulotome was used to lyse all of the  valves. The vein was then marked for orientation. The vein was then tunneled  subcutaneously down to the level of dorsalis pedis artery. It was cut to  length in this location. A tourniquet was then inflated on the calf to 300  mmHg. Longitudinal arteriotomy was made in the dorsalis pedis artery. The  vein was spatulated and sewn end of vein to side of artery using a running 7-  0 Prolene suture. Just prior to completion of the anastomosis, the  tourniquet was deflated. This was forward bled, back bled, and thoroughly  flushed. Anastomosis was secured and there was a palpable pulse in the  graft. There was a good Doppler dorsalis pedis signal. Hemostasis was  obtained in all incisions. Subcutaneous layers were closed with Vicryl  sutures. Skin on the dorsum of the foot was closed with nylon sutures as  well as the  medial incision on the calf where the vein was in a subcutaneous  position. The patient tolerated the procedure well and there were no  complications. Instrument, sponge, and needle count was correct at the end  of the case. The patient was taken to the recovery room in stable condition.           ______________________________  [name] Fields, MD     CEF/MEDQ  D:  07/18/2005  T:  07/18/2005  Job:  [id number]

## 2011-03-22 NOTE — Op Note (Signed)
[name], [name]                   ACCOUNT NO.:  [id number]   MEDICAL RECORD NO.:  [id number]          PATIENT TYPE:  OIB   LOCATION:  2860                         FACILITY:  [facility]   PHYSICIAN:  [name]. Fields, MD  DATE OF BIRTH:  31-Jan-1941   DATE OF PROCEDURE:  07/16/2005  DATE OF DISCHARGE:                                 OPERATIVE REPORT   PROCEDURE:  Aortogram with bilateral lower extremity runoff.   PREOPERATIVE DIAGNOSIS:  Ischemia, left foot.   POSTOPERATIVE DIAGNOSIS:  Ischemia, left foot.   ANESTHESIA:  Local.   ASSISTANT:  Nurse.   INDICATIONS:  The patient is a 70 year old male with a 6-week history of  enlarging ulcer of his left first toe. This has progressed to a large wound  on the left foot with evidence of ischemia.   OPERATIVE DETAIL:  After obtaining informed consent, the patient was brought  to the [facility] lab. The patient was placed in the supine position on the angio  table. Next, both groins were prepped and draped in the usual sterile  fashion. Local anesthesia was infiltrated over the right common femoral  artery. Majestic needle was then used to cannulate the right common femoral  artery and a 0.035 J-wire advanced into the abdominal aorta under  fluoroscopic guidance. Next, 5-French sheath was placed over the guidewire  in the right femoral artery. Guidewire was advanced up into the upper  abdominal aorta and a 5-French pigtail catheter placed over the guidewire  into the abdominal aorta. Next, an abdominal aortogram was obtained which  shows that the left and right renal arteries are patent. There are two renal  arteries on the left, one with a very early bifurcation and a third inferior  pole renal artery on the left, so a total of three arteries on the left.  These are all patent. On the right side, there are two renal arteries which  are also patent. The lower abdominal aorta has mild atherosclerosis and the  aorta just above the bifurcation is  slightly ectatic. Left and right common  iliac arteries are patent. The left and right internal iliac arteries are  patent. Left and right external iliac arteries and common femoral arteries  are patent. Next, the pigtail catheter was pulled back over a guidewire and  a 5-French crossover catheter placed over the guidewire and over the aortic  bifurcation. Guidewire was then removed and a 0.35 angled Glidewire advanced  down into the left common femoral artery. The crossover catheter was then  removed and a 5-French end-hole catheter placed over the guidewire down in  the left common femoral artery. Selective injection of the left common  femoral artery was then obtained. This shows a widely patent left common  femoral, superficial femoral, and profunda femoris artery. There is mild  atherosclerotic changes in the superficial femoral artery. The popliteal  artery is occluded at the adductor hiatus. There are several large  collaterals forming in this region. The popliteal artery reconstitutes just  above the knee joint but has a large amount of atherosclerosis. There are,  again, abundant collaterals around the knee. The tibioperoneal trunk is  patent but heavily diseased. The origin of the anterior tibial artery is  patent but heavily diseased. The posterior tibial artery is occluded at its  origin. The peroneal artery is patent to the mid leg and then diffusely  diseased distally. The anterior tibial artery is severely diseased in its  mid portion and also at the level of the ankle. The peroneal artery gives  off a posterior communicating branch but this occludes above the ankle.  There is no anterior communicating branch. The dorsalis pedis artery is  patent but there is severe disease above the ankle. There is partial filling  of the plantar arch by the dorsalis pedis artery. There is minimal filling  of the arch from the posterior tibial side and this was mainly via  collaterals    Next, the end-hole catheter was removed and a selective injection of the  right lower extremity was performed through the sheath. This shows a widely  patent common superficial and profunda femoris artery. There is moderate  atherosclerotic change of the right superficial femoral artery with several  areas of stenoses. There is one high-grade area of stenosis greater than 70%  in its mid portion. The popliteal artery is severely diseased. There is a  50% narrowing right at the level of the adductor hiatus. There is suggestion  of popliteal aneurysm behind the right knee due to an area of ectasia or  deep ulceration in the popliteal artery just above the level of the knee  joint. The distal popliteal artery has mild atherosclerotic changes and is  patent. The tibioperoneal trunk is patent but moderately diseased. The  anterior tibial artery origin is patent but there is a high-grade stenosis  just after its takeoff. The peroneal artery is patent in its proximal  portion. The posterior tibial artery occludes very proximally. The anterior  tibial artery again has a high-grade stenosis in its mid portion and a mild  stenosis just above the ankle. The dorsalis pedis artery is patent in the  foot. The peroneal artery is patent throughout its course and gives off  anterior and posterior communicating branches in the right leg. Dorsalis  pedis artery fills the plantar arch partially similar to the contralateral  leg. There is filling of the posterior portion of the arch from collaterals.   Next, the right femoral sheath was removed and hemostasis obtained with  direct pressure. The patient tolerated procedure well and there were no  complications.   IMPRESSION:  1.  Occluded left popliteal artery with patent left anterior tibial and      peroneal artery primary runoff via the anterior tibial artery with a      patent dorsalis pedis artery. 2.  Right leg shows severe disease of the right  popliteal artery with severe      tibial disease occlusion of the posterior tibial artery, heavily      diseased proximal anterior tibial artery, and patent distal anterior      tibial and peroneal arteries with dorsalis pedis runoff to the foot on      the right side.           ______________________________  [name] Fields, MD     CEF/MEDQ  D:  07/16/2005  T:  07/16/2005  Job:  [id number]

## 2011-03-22 NOTE — Discharge Summary (Signed)
NAMEHILLMAN, RUDISILL                   ACCOUNT NO.:  [id number]   MEDICAL RECORD NO.:  [id number]          PATIENT TYPE:  INP   LOCATION:  2013                         FACILITY:  [facility]   PHYSICIAN:  [name]. Fields, MD  DATE OF BIRTH:  1941/04/07   DATE OF ADMISSION:  07/16/2005  DATE OF DISCHARGE:  07/29/2005                                 DISCHARGE SUMMARY   ADDENDUM TO JOB [id number]:  __________  was anticipated.   FINAL DIAGNOSES:  1.  Severe left lower extremity peripheral vascular occlusive disease,      status post left superficial femoral artery to dorsalis pedis bypass,      with nonreversed greater saphenous vein, and status post left first toe      amputation for gangrene.  2.  Left popliteal vein thrombus per lower extremity venous duplex on      July 16, 2005.  3.  History of left foot bunionectomy approximately 7 years ago.  4.  History of cataract surgery approximately 7-8 years ago.  5.  Diabetes mellitus type 2 x10 years.  6.  Hypertension.  7.  Hypercholesterolemia.  8.  Left carotid surgery approximately 10 years ago.  9.  Appendectomy as a child.   No known drug allergies.   Please refer to previously dictated discharge summary for history and  hospital course.  Postoperatively, Mr. Miltimore was started on Coumadin for  anticoagulation secondary to left popliteal vein DVT.  He was also  maintained on a heparin drip.  By July 29, 2005, Mr. Liggins's INR was  still subtherapeutic, with an INR of only 1.1.  This was despite 5 days of  Coumadin therapy.  Otherwise, Mr. Paolucci remained stable and expressed desire  to be discharged home.  Subsequently, case manager consult was requested to  see if he qualified for home Lovenox, which in fact he did.  Once  arrangements were made, heparin drip was discontinued, and Mr. Delpino was  discharged home on July 29, 2005.  The wife was instructed on dressing  changes prior to discharge.  Home health nursing through [facility]  was also arranged for wound checks and morning wet-to-dry saline gauze  dressing changes to his left toe amputation site, with plans for the wife to  perform the afternoon dressing changes.  Home health nursing orders were  also written for a PT/INR lab draw on Wednesday July 31, 2005, with  result initially sent to Dr. Ruta Hinds at the [place] office.  In the  meantime, Mr. Laplante will establish himself with a family practice physician.  At this point, he is planning to contact the [facility].  All other discharge instructions are as previously dictated.   UPDATED DISCHARGE MEDICATIONS:  Same as previously dictated, other than  Lovenox bridge of 160 mg subcutaneously daily while INR is subtherapeutic.  He will be discharged home on Coumadin 5 mg p.o. daily and as directed.      Jacinta Shoe, P.A.    ______________________________  [name] Oneida Alar, MD  AWZ/MEDQ  D:  07/29/2005  T:  07/30/2005  Job:  [id number]   cc:   [name]. [place], [place], [place] [id number]

## 2011-03-22 NOTE — Op Note (Signed)
[name], [name]                   ACCOUNT NO.:  [id number]   MEDICAL RECORD NO.:  [id number]          PATIENT TYPE:  INP   LOCATION:  2013                         FACILITY:  [facility]   PHYSICIAN:  [name]. Fields, MD  DATE OF BIRTH:  February 07, 1941   DATE OF PROCEDURE:  07/22/2005  DATE OF DISCHARGE:                                 OPERATIVE REPORT   PROCEDURE:  Left first toe amputation.   PREOPERATIVE DIAGNOSIS:  Gangrene left first toe.   POSTOPERATIVE DIAGNOSIS:  Gangrene left first toe.   ANESTHESIA:  General.   ASSISTANT:  Nurse.   OPERATIVE FINDINGS:  Gangrene left first toe was small abscess.   SPECIMENS:  Left first toe.   OPERATIVE DETAILS:  After obtaining informed consent, the patient was taken  to the operating room.  The patient was placed in the supine position on the  operating table.  After induction of general anesthesia, the patient's  entire left foot was prepped and draped in the usual sterile fashion.  A  circumferential incision was made over the left first toe near the  metatarsal phalangeal joint.  The bone cutter was then used to transect the  bone and removed the toe.  The proximal portion of the phalanx was removed  with rongeurs.  Metatarsal head was debrided with rongeurs.  Hemostasis was  obtained.  The wound was then packed with normal saline moistened Kerlix.  Dry sterile dressing was applied. There  were good bleeding tissues. The  wound was thoroughly irrigated with normal saline solution prior to  placement of the dressing.  The patient tolerated the procedure well, and  there were no complications.  Instrument, sponge and needle counts were  correct at the end of the case. The patient was taken to the recovery in  stable condition.           ______________________________  [name] Fields, MD     CEF/MEDQ  D:  07/22/2005  T:  07/23/2005  Job:  [id number]

## 2011-03-22 NOTE — Discharge Summary (Signed)
[name], [name]                   ACCOUNT NO.:  [id number]   MEDICAL RECORD NO.:  [id number]          PATIENT TYPE:  INP   LOCATION:  2013                         FACILITY:  [facility]   PHYSICIAN:  [name]. Fields, MD  DATE OF BIRTH:  [birth year]   DATE OF ADMISSION:  07/16/2005  DATE OF DISCHARGE:                                 DISCHARGE SUMMARY   HISTORY OF PRESENT ILLNESS:  The patient is a 70 year old male referred to  Dr. Ruta Hinds for vascular surgical consultation. The patient was first  evaluated on July 12, 2005 with a history where approximately 6 weeks  previously he cut an area on his left first toe. The wound failed to heal  and became progressively larger in size. He denied frank rest pain to the  foot. He was noted to have multiple atherosclerotic risk factors including  diabetes, hypertension, and hypercholesterolemia. He is also a former smoker  for approximately 30 years; however, he quit 15 years ago. He denied any  prior symptoms of claudication. He denied any fevers or other constitutional  symptoms. Upon evaluation, the patient was noted to have decreased  ankle/brachial index on the left of 0.31, and 0.75 on the right. 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 The patient was subsequently scheduled for arteriography  which was done on Tuesday, September 12 and was evaluated by Dr. Oneida Alar who  recommended proceeding with a left superficial femoral artery to dorsalis  pedis bypass, and this was scheduled. The patient underwent preoperative  saphenous vein mapping and felt to be a candidate for proceeding.   PROCEDURE:  On July 17, 2005 the patient underwent the following  procedure:  Left superficial femoral artery to dorsalis pedis bypass with  nonreversed greater saphenous vein. The patient tolerated the procedure well  and was taken to the post anesthesia care unit in stable condition.   POSTOPERATIVE HOSPITAL COURSE:  The patient has done well. He has a palpable  graft pulse. Ankle/brachial index done postoperatively showed a significant  improvement from 0.31 on the left to 0.98. The patient did, however, require  further surgical intervention for his left first toe, and on July 22, 2005 he underwent left first toe amputation. He tolerated that procedure  well and was taken to the post anesthesia care unit in stable condition.   Postoperative hospital course following the second procedure has also been  quite unremarkable. He has had a good and gradual increase in activity  level. He has remained hemodynamically stable. Capillary blood glucose has  been under good control. He has been started on heparin and Coumadin  postoperatively. His INR has been checked daily and currently it is felt he  is stable for discharge when his INR is greater than 2.0.   MEDICATIONS ON DISCHARGE:  Will be as follows:  1.  Glyburide 5 mg twice daily.  2.  Glucophage 500 mg twice daily.  3.  Coumadin dosage to be determined at the time of discharge.  4.  For pain, Tylox one  or two q.6h. p.r.n. as needed.   INSTRUCTIONS:  The patient received written instructions regarding  medications, activity, diet, wound care, and follow-up.   FOLLOW-UP:  Will include daily dressing changes. A home health nurse has  been arranged for that. Additionally, the patient's INR will be drawn and  called to Dr. Oneida Alar at the [place] office. Dr. Oneida Alar will see the patient on  Friday, August 02, 2005 at 8:30 a.m.   FINAL DIAGNOSIS:  Severe left lower extremity peripheral vascular arterial  occlusive disease, now status post revascularization as described.  Additionally, he is status post left first toe amputation for gangrene.   OTHER DIAGNOSES:  As previously listed per the history.   CONDITION ON DISCHARGE:  Stable and improved.      John Giovanni, P.A.-C.    ______________________________   [name] Oneida Alar, MD    WEG/MEDQ  D:  07/26/2005  T:  07/26/2005  Job:  [id number]

## 2011-03-22 NOTE — Discharge Summary (Signed)
NAMESALMAAN, HONKOMP NO.:  [id number]   MEDICAL RECORD NO.:  [id number]          PATIENT TYPE:  INP   LOCATION:  2024                         FACILITY:  [facility]   PHYSICIAN:  [name]. Fields, MD  DATE OF BIRTH:  10-01-1941   DATE OF ADMISSION:  09/01/2008  DATE OF DISCHARGE:  09/03/2008                               DISCHARGE SUMMARY   DIAGNOSES:  1. Vein graft stenosis  proximal.  2. Diabetes.  3. Hypertension.  4. Peripheral vascular disease.   PROCEDURE PERFORMED:  Endarterectomy of left superficial femoral artery  with bovine patch angioplasty by Dr. Oneida Alar on 08/29/2008.   DISCHARGE MEDICATIONS:  1. __________  p.o. t.i.d.  2. Coreg 12.5 mg p.o. b.i.d.  3. Lisinopril 40 mg p.o. q.a.m.  4. Crestor 40 mg p.o. q.a.m.  5. Aspirin 325 mg p.o. q.a.m.  6. Zetia 10 mg p.o. q.a.m.   He is given a prescription for Percocet 5/325 one p.o. q.4h. p.r.n.  pain.   DISPOSITION:  He is being discharged home in stable condition with the  wound healing well.  He is given careful instructions regarding care of  his wounds and his activity level.  He is to see Dr. Oneida Alar in 2 weeks  for followup at which time, ABI's will be obtained.   Brief identifying statement, for complete details, please refer the  typed history and physical.  Briefly, this is a very pleasant 70-year-  old gentleman returned to see Dr. Oneida Alar complaining of recurrent  claudication symptoms in his left lower extremity.  Dr. Oneida Alar evaluated  him and found that he had  a narrowing of his superficial femoral artery  at the center of his bypass.  He recommended endarterectomy at the left  superficial femoral artery with a bovine patch angioplasty.  Mr. Leventhal was  informed of the risks, benefits of the procedure __________  endarterectomy.  For  complete details, please refer the typed operative report.  __________  He was transferred to a bed on a surgical convalescent floor.  His  activity level  and diet were advanced as tolerated.  Following the  morning, he was found to be stable and was discharged home in stable  condition.      Chad Cordial, PA      [name]. Fields, MD  Electronically Signed    KEL/MEDQ  D:  10/10/2008  T:  10/11/2008  Job:  [id number]

## 2011-06-13 DIAGNOSIS — E088 Diabetes mellitus due to underlying condition with unspecified complications: Secondary | ICD-10-CM | POA: Insufficient documentation

## 2011-07-11 ENCOUNTER — Encounter: Payer: Self-pay | Admitting: Physician Assistant

## 2011-08-05 LAB — GLUCOSE, CAPILLARY
Glucose-Capillary: 137 — ABNORMAL HIGH
Glucose-Capillary: 144 — ABNORMAL HIGH
Glucose-Capillary: 146 — ABNORMAL HIGH
Glucose-Capillary: 169 — ABNORMAL HIGH
Glucose-Capillary: 170 — ABNORMAL HIGH
Glucose-Capillary: 174 — ABNORMAL HIGH
Glucose-Capillary: 177 — ABNORMAL HIGH
Glucose-Capillary: 180 — ABNORMAL HIGH

## 2011-08-05 LAB — POCT I-STAT, CHEM 8
BUN: 19
Calcium, Ion: 1.14
Glucose, Bld: 152 — ABNORMAL HIGH
HCT: 42
Sodium: 140
TCO2: 24

## 2011-08-05 LAB — URINALYSIS, ROUTINE W REFLEX MICROSCOPIC
Bilirubin Urine: NEGATIVE
Hgb urine dipstick: NEGATIVE
Ketones, ur: NEGATIVE
Nitrite: NEGATIVE
Protein, ur: NEGATIVE
Urobilinogen, UA: 1
pH: 6

## 2011-08-05 LAB — CBC
Hemoglobin: 12.5 — ABNORMAL LOW
Hemoglobin: 13.4
MCHC: 33.3
MCHC: 34
MCV: 88.5
RBC: 4.55
RDW: 13.8
WBC: 8.1

## 2011-08-05 LAB — PROTIME-INR: INR: 1

## 2011-08-05 LAB — COMPREHENSIVE METABOLIC PANEL
ALT: 25
Albumin: 3.5
Potassium: 3.6
Sodium: 138
Total Bilirubin: 0.8
Total Protein: 5.8 — ABNORMAL LOW

## 2011-08-05 LAB — BLOOD GAS, ARTERIAL
Acid-base deficit: 1.9
FIO2: 0.21
O2 Saturation: 95.7
TCO2: 23
pH, Arterial: 7.423
pO2, Arterial: 77.5 — ABNORMAL LOW

## 2011-08-05 LAB — APTT: aPTT: 28

## 2011-08-05 LAB — BASIC METABOLIC PANEL
Calcium: 8.6
Chloride: 103
GFR calc Af Amer: >60
Potassium: 4.1

## 2011-08-21 ENCOUNTER — Encounter: Payer: Self-pay | Admitting: Physician Assistant

## 2011-08-22 ENCOUNTER — Encounter (INDEPENDENT_AMBULATORY_CARE_PROVIDER_SITE_OTHER): Payer: Medicare Other | Admitting: Vascular Surgery

## 2011-08-22 ENCOUNTER — Ambulatory Visit (INDEPENDENT_AMBULATORY_CARE_PROVIDER_SITE_OTHER): Payer: Medicare Other | Admitting: Physician Assistant

## 2011-08-22 ENCOUNTER — Encounter: Payer: Self-pay | Admitting: Physician Assistant

## 2011-08-22 VITALS — BP 166/112 | HR 124 | Resp 20 | Ht 69.0 in | Wt 196.0 lb

## 2011-08-22 DIAGNOSIS — Z48812 Encounter for surgical aftercare following surgery on the circulatory system: Secondary | ICD-10-CM

## 2011-08-22 DIAGNOSIS — I739 Peripheral vascular disease, unspecified: Secondary | ICD-10-CM

## 2011-08-22 DIAGNOSIS — L98499 Non-pressure chronic ulcer of skin of other sites with unspecified severity: Secondary | ICD-10-CM

## 2011-08-22 NOTE — Progress Notes (Signed)
[facility] [place] HISTORY AND PHYSICAL EXAMINATION   CC: f/u lab for leg bypass  HPI: [name] is a 70 y.o. male who returns today for surveillance of his left femoral to DP BPG done 07/17/05 and left SFA endarterectomy 09/01/08 by Dr. Oneida Alar.  He states that he is having no problems with claudication.    PAST MEDICAL/PAST SURGICAL HISTORY: Past Medical History  Diagnosis Date  . Diabetes mellitus Age 43  . Hyperlipidemia   . Hypertension   . Peripheral vascular disease   . Carotid artery occlusion   . Joint pain   . Ulcer   . Arthritis   CAD  Past Surgical History  Procedure Date  . Foot surgery   . Carotid endarterectomy   . Amputation     left first toe  . Pr vein bypass graft,aorto-fem-pop 07/2005    left femoral to dorsalis pedis artery bypass  . Appendectomy   Hx CABG  No Known Allergies  Current Outpatient Prescriptions  Medication Sig Dispense Refill  . CIPROFLOXACIN HCL OP Apply to eye.        . fenofibrate micronized (ANTARA) 130 MG capsule Take 130 mg by mouth daily before breakfast.        . GLYBURIDE PO Take by mouth.        Marland Kitchen lisinopril (PRINIVIL,ZESTRIL) 20 MG tablet Take 20 mg by mouth daily.        Marland Kitchen METFORMIN HCL PO Take by mouth.        . OXYCODONE HCL PO Take by mouth.        . warfarin (COUMADIN) 10 MG tablet Take 10 mg by mouth daily.          FAMILY HISTORY: Family History  Problem Relation Age of Onset  . Heart disease Father     SOCIAL HISTORY: History  Substance Use Topics  . Smoking status: Former Smoker    Quit date: 11/04/1978  . Smokeless tobacco: Never Used  . Alcohol Use: No    REVIEW OF SYSTEMS:  CONSTITUTIONAL: No fever or chills. +10lb intentional weight loss in the past month HEENT:  No hoarseness.  No recent cold. CARDIAC: No chest pain, chest pressure, palpitations, orthopnea, or dyspnea on exertion.  VASCULAR:  No claudication or rest pain. No history of DVT or phlebitis. PULMONARY: No  productive cough, asthma, or wheezing. NEUROLOGIC: No weakness, paresthesias, aphasia, amaurosis, or dizziness. HEMATOLOGIC: No bleeding problems or clotting disorders. GASTROINTESTINAL: No blood in stool. No peptic ulcer disease. GENITOURINARY: No dysuria or hematuria. Denies BPH INTEGUMENTARY: No rashes or ulcers. MUSCULOSKELETAL: _+arthritis PSYCHIATRIC: No history of major depression.   PHYSICAL EXAMINATION:  Filed Vitals:   08/22/11 1401  BP: 166/112  Pulse: 124  Resp: 20   Pt's BP elevated, but pt states he takes his BP regularly at home and it runs ~ 130/65 on a regular basis.  Body mass index is 28.94 kg/(m^2).  General:  NAD Gait: Normal HENT: WNL, well healed left CEA scar Eyes: PERRL Cardiac: RRR, without  Murmurs, rubs or gallops; No carotid bruits Pulmonary: normal non-labored breathing , without Rales, rhonchi,  wheezing Abdomen: soft, NT, no masses, positive BS Skin: no rashes, ulcers noted Vascular Exam/Pulses: 2+ DP pulse bilaterally; -edema and feet are warm and well perfused; 2+radial pulses bilaterally Musculoskeletal: no muscle wasting or atrophy  Neurologic: A&O X 3; Appropriate Affect ; SENSATION: normal; MOTOR FUNCTION:  moving all extremities equally. Speech is fluent/normal Extremities without ischemic changes, no Gangrene ,  no cellulitis; no open wounds; left great toe amp site well healed  NON-INVASIVE IMAGING RESULTS: Bypass graft evaluation Mild heterogeneous plaque formations noted involving the left lower extremity arterial system.  Elevated velocities suggestive of 50-79% stenosis involving the left proximal SFA. Patent left mid SFA to DPA BPG Native artery plaque and stenosis present as noted above. Right ABI = 0.89 AND IS IN THE MILD CLAUDICATION RANGE Left   ABI = 1.02 and is considered normal. Essentially unchanged since previous exam on 08/22/10 ASSESSMENT: 70 y/o s/p left femoral to DP BPG 07/17/05 and left SFA endarterectomy 09/01/08  by Dr. Oneida Alar. He is doing well and his lab study is essentially unchanged.  He does have a history of left CEA and does not have any carotid bruits or any symptoms of CVA/TIA PLAN: Return in one year for f/u ABIs and at that time we will repeat his carotid duplex scan.  Clinic MD:  Oneida Alar

## 2011-08-29 NOTE — Procedures (Unsigned)
BYPASS GRAFT EVALUATION  INDICATION:  Follow up peripheral vascular disease.  HISTORY: Diabetes:  Yes. Cardiac:  No. Hypertension:  Yes. Smoking:  Previous. Previous Surgery:  Left mid superficial femoral artery to dorsalis pedis artery bypass graft on 07/17/2005; left superficial femoral artery endarterectomy on 09/01/2008.  SINGLE LEVEL ARTERIAL EXAM                              RIGHT              LEFT Brachial: Anterior tibial: Posterior tibial: Peroneal: Ankle/brachial index:  PREVIOUS ABI:  Date: 08/22/2010  RIGHT:  0.85  LEFT:  1.03  LOWER EXTREMITY BYPASS GRAFT DUPLEX EXAM:  DUPLEX: 1. Mild heterogenous plaque formations noted involving the left lower     extremity arterial system. 2. Elevated velocities suggestive of 50% to 75% stenosis involving the     left proximal superficial femoral artery.  IMPRESSION: 1. Patent left mid superficial femoral artery to dorsalis pedis artery     bypass graft. 2. Native artery plaque and stenosis present, as noted above. 3. Right ankle brachial index is 0.89 and is in the mid claudication     range. 4. Left ankle brachial index is 1.02 and is considered normal. 5. Essentially unchanged since the previous study on 08/22/2010.  ___________________________________________ [name]. Fields, MD  SH/MEDQ  D:  08/22/2011  T:  08/22/2011  Job:  [id number]

## 2012-08-17 ENCOUNTER — Other Ambulatory Visit: Payer: Self-pay | Admitting: *Deleted

## 2012-08-17 DIAGNOSIS — Z48812 Encounter for surgical aftercare following surgery on the circulatory system: Secondary | ICD-10-CM

## 2012-08-17 DIAGNOSIS — I6529 Occlusion and stenosis of unspecified carotid artery: Secondary | ICD-10-CM

## 2012-08-17 DIAGNOSIS — I739 Peripheral vascular disease, unspecified: Secondary | ICD-10-CM

## 2012-09-01 ENCOUNTER — Encounter: Payer: Self-pay | Admitting: Neurosurgery

## 2012-09-02 ENCOUNTER — Other Ambulatory Visit (INDEPENDENT_AMBULATORY_CARE_PROVIDER_SITE_OTHER): Payer: Medicare Other | Admitting: *Deleted

## 2012-09-02 ENCOUNTER — Ambulatory Visit (INDEPENDENT_AMBULATORY_CARE_PROVIDER_SITE_OTHER): Payer: Medicare Other | Admitting: Neurosurgery

## 2012-09-02 ENCOUNTER — Encounter (INDEPENDENT_AMBULATORY_CARE_PROVIDER_SITE_OTHER): Payer: Medicare Other | Admitting: *Deleted

## 2012-09-02 ENCOUNTER — Encounter: Payer: Self-pay | Admitting: Neurosurgery

## 2012-09-02 VITALS — BP 174/86 | HR 66 | Resp 16 | Ht 69.0 in | Wt 200.0 lb

## 2012-09-02 DIAGNOSIS — Z48812 Encounter for surgical aftercare following surgery on the circulatory system: Secondary | ICD-10-CM

## 2012-09-02 DIAGNOSIS — I739 Peripheral vascular disease, unspecified: Secondary | ICD-10-CM

## 2012-09-02 DIAGNOSIS — I6529 Occlusion and stenosis of unspecified carotid artery: Secondary | ICD-10-CM | POA: Insufficient documentation

## 2012-09-02 NOTE — Progress Notes (Signed)
[facility] [place] Carotid Office Note  CC: Carotid and PVD surveillance Referring Physician: Fields  History of Present Illness: 71 year old male patient of Dr. Oneida Alar who is status post left great toe amputation by Dr. Oneida Alar in the past with left mid superficial femoral artery to dorsalis pedis artery bypass graft in 2006, left superficial femoral artery endarterectomy in 2009. The patient also had a left CEA in [employer]. The patient denies claudication, rest pain, no signs or symptoms of CVA, TIA, amaurosis fugax or any neural deficit.  Past Medical History  Diagnosis Date  . Diabetes mellitus Age 39  . Hyperlipidemia   . Hypertension   . Peripheral vascular disease   . Carotid artery occlusion   . Joint pain   . Ulcer   . Arthritis     ROS: [x]  Positive   [ ]  Denies    General: [ ]  Weight loss, [ ]  Fever, [ ]  chills Neurologic: [ ]  Dizziness, [ ]  Blackouts, [ ]  Seizure [ ]  Stroke, [ ]  "Mini stroke", [ ]  Slurred speech, [ ]  Temporary blindness; [ ]  weakness in arms or legs, [ ]  Hoarseness Cardiac: [ ]  Chest pain/pressure, [ ]  Shortness of breath at rest [ ]  Shortness of breath with exertion, [ ]  Atrial fibrillation or irregular heartbeat Vascular: [ ]  Pain in legs with walking, [ ]  Pain in legs at rest, [ ]  Pain in legs at night,  [ ]  Non-healing ulcer, [ ]  Blood clot in vein/DVT,   Pulmonary: [ ]  Home oxygen, [ ]  Productive cough, [ ]  Coughing up blood, [ ]  Asthma,  [ ]  Wheezing Musculoskeletal:  [ ]  Arthritis, [ ]  Low back pain, [ ]  Joint pain Hematologic: [ ]  Easy Bruising, [ ]  Anemia; [ ]  Hepatitis Gastrointestinal: [ ]  Blood in stool, [ ]  Gastroesophageal Reflux/heartburn, [ ]  Trouble swallowing Urinary: [ ]  chronic Kidney disease, [ ]  on HD - [ ]  MWF or [ ]  TTHS, [ ]  Burning with urination, [ ]  Difficulty urinating Skin: [ ]  Rashes, [ ]  Wounds Psychological: [ ]  Anxiety, [ ]  Depression   Social History History  Substance Use Topics  .  Smoking status: Former Smoker    Quit date: 11/04/1978  . Smokeless tobacco: Never Used  . Alcohol Use: No    Family History Family History  Problem Relation Age of Onset  . Heart disease Father     No Known Allergies  Current Outpatient Prescriptions  Medication Sig Dispense Refill  . aspirin EC 325 MG tablet Take 325 mg by mouth daily.      . carvedilol (COREG) 12.5 MG tablet Take 12.5 mg by mouth 2 (two) times daily with a meal.      . ezetimibe (ZETIA) 10 MG tablet Take 10 mg by mouth daily.      . GLYBURIDE PO Take by mouth.        . glyBURIDE-metformin (GLUCOVANCE) 5-500 MG per tablet Take 1 tablet by mouth daily with breakfast.      . lisinopril (PRINIVIL,ZESTRIL) 20 MG tablet Take 40 mg by mouth daily.       Marland Kitchen METFORMIN HCL PO Take by mouth.        . rosuvastatin (CRESTOR) 40 MG tablet Take 40 mg by mouth daily.      Marland Kitchen CIPROFLOXACIN HCL OP Apply to eye.        . fenofibrate micronized (ANTARA) 130 MG capsule Take 130 mg by mouth daily before breakfast.        .  OXYCODONE HCL PO Take by mouth.        . warfarin (COUMADIN) 10 MG tablet Take 10 mg by mouth daily.          Physical Examination  Filed Vitals:   09/02/12 1354  BP: 174/86  Pulse:   Resp:     Body mass index is 29.53 kg/(m^2).  General:  WDWN in NAD Gait: Normal HEENT: WNL Eyes: Pupils equal Pulmonary: normal non-labored breathing , without Rales, rhonchi,  wheezing Cardiac: RRR, without  Murmurs, rubs or gallops; Abdomen: soft, NT, no masses Skin: no rashes, ulcers noted  Vascular Exam Pulses: Palpable femoral pulses bilaterally PT and DP pulses are palpable, 3+ radial pulses bilaterally Carotid bruits: Carotid pulses to auscultation no bruits are Extremities without ischemic changes, no Gangrene , no cellulitis; no open wounds;  Musculoskeletal: no muscle wasting or atrophy   Neurologic: A&O X 3; Appropriate Affect ; SENSATION: normal; MOTOR FUNCTION:  moving all extremities equally. Speech  is fluent/normal  Non-Invasive Vascular Imaging CAROTID DUPLEX 09/02/2012  Right ICA 20 - 39 % stenosis Left ICA 0 - 19% stenosis ABIs today are 0.91 and biphasic to monophasic on the right, 1.04 and monophasic to triphasic on the left with a patent left lower stream the bypass graft  ASSESSMENT/PLAN: Asymptomatic patient followed for carotid and PVD, the patient will followup in one year with repeat lower extremity bypass graft and ABIs as well as carotid duplex. The patient's questions were encouraged and answered, he is in agreement with this plan. Everlean Patterson ANP   Clinic MD: Kellie Simmering on call

## 2012-09-03 NOTE — Addendum Note (Signed)
Addended by: Mena Goes on: 09/03/2012 12:43 PM   Modules accepted: Orders

## 2013-09-03 ENCOUNTER — Ambulatory Visit: Payer: Medicare Other | Admitting: Family

## 2013-09-03 ENCOUNTER — Encounter (HOSPITAL_COMMUNITY): Payer: Medicare Other

## 2013-09-03 ENCOUNTER — Other Ambulatory Visit (HOSPITAL_COMMUNITY): Payer: Medicare Other

## 2013-09-08 ENCOUNTER — Ambulatory Visit: Payer: Medicare Other | Admitting: Family

## 2013-09-08 ENCOUNTER — Other Ambulatory Visit (HOSPITAL_COMMUNITY): Payer: Medicare Other

## 2013-09-08 ENCOUNTER — Encounter (HOSPITAL_COMMUNITY): Payer: Medicare Other

## 2013-10-14 ENCOUNTER — Ambulatory Visit (INDEPENDENT_AMBULATORY_CARE_PROVIDER_SITE_OTHER): Payer: Medicare Other

## 2013-10-14 ENCOUNTER — Encounter: Payer: Self-pay | Admitting: Family

## 2013-10-14 VITALS — BP 145/58 | HR 64 | Resp 18

## 2013-10-14 DIAGNOSIS — L608 Other nail disorders: Secondary | ICD-10-CM

## 2013-10-14 DIAGNOSIS — E1140 Type 2 diabetes mellitus with diabetic neuropathy, unspecified: Secondary | ICD-10-CM

## 2013-10-14 DIAGNOSIS — I739 Peripheral vascular disease, unspecified: Secondary | ICD-10-CM

## 2013-10-14 DIAGNOSIS — Q828 Other specified congenital malformations of skin: Secondary | ICD-10-CM

## 2013-10-14 DIAGNOSIS — E1149 Type 2 diabetes mellitus with other diabetic neurological complication: Secondary | ICD-10-CM

## 2013-10-14 DIAGNOSIS — E1142 Type 2 diabetes mellitus with diabetic polyneuropathy: Secondary | ICD-10-CM

## 2013-10-14 NOTE — Progress Notes (Signed)
   Subjective:    Patient ID: [name], male    DOB: [birth year], 72 y.o.   MRN: [id number]  HPI I am here to get my nails and calluses trimmed up    Review of Systems  Constitutional: Negative.   HENT: Negative.   Eyes: Negative.   Respiratory: Negative.   Cardiovascular: Negative.   Gastrointestinal: Negative.   Endocrine: Negative.   Genitourinary: Negative.   Musculoskeletal: Negative.   Skin: Negative.   Allergic/Immunologic: Negative.   Neurological: Negative.   Hematological: Bruises/bleeds easily.  Psychiatric/Behavioral: Negative.        Objective:   Physical Exam Vascular status is intact as follows pedal pulses thready PT pulse one over 4 bilateral dorsalis pedis +2/4 bilateral. Patient is history of amputation left great toe Refill time 4 seconds all digits. Temperature warm turgor normal no edema noted. Neurologically epicritic and proprioceptive sensations diminished on Semmes Weinstein testing to forefoot and plantar arch and digital nails thick brittle friable discolored in ingrowing x9. There is also multiple keratoses and calluses sub-1 right sub-1 and 4 left. Also some keratoses sub-5 right. Patient has rigid digital contractures and mild arthropathy contributing to the symptoms. Maintain accommodative shoes diabetic shoes as instructed       Assessment & Plan:  Assessment diabetes with history peripheral neuropathy and history of amputation. Dystrophic friable mycotic nails debrided x9 the presence of diabetes and complications. Also debridement multiple keratoses bilateral plantar feet no active ulcer discharge or drainage are noted at this time. Followup in 3 months for continued palliative care  Harriet Masson DPM

## 2013-10-14 NOTE — Patient Instructions (Signed)
Diabetes and Foot Care Diabetes may cause you to have problems because of poor blood supply (circulation) to your feet and legs. This may cause the skin on your feet to become thinner, break easier, and heal more slowly. Your skin may become dry, and the skin may peel and crack. You may also have nerve damage in your legs and feet causing decreased feeling in them. You may not notice minor injuries to your feet that could lead to infections or more serious problems. Taking care of your feet is one of the most important things you can do for yourself.  HOME CARE INSTRUCTIONS  Wear shoes at all times, even in the house. Do not go barefoot. Bare feet are easily injured.  Check your feet daily for blisters, cuts, and redness. If you cannot see the bottom of your feet, use a mirror or ask someone for help.  Wash your feet with warm water (do not use hot water) and mild soap. Then pat your feet and the areas between your toes until they are completely dry. Do not soak your feet as this can dry your skin.  Apply a moisturizing lotion or petroleum jelly (that does not contain alcohol and is unscented) to the skin on your feet and to dry, brittle toenails. Do not apply lotion between your toes.  Trim your toenails straight across. Do not dig under them or around the cuticle. File the edges of your nails with an emery board or nail file.  Do not cut corns or calluses or try to remove them with medicine.  Wear clean socks or stockings every day. Make sure they are not too tight. Do not wear knee-high stockings since they may decrease blood flow to your legs.  Wear shoes that fit properly and have enough cushioning. To break in new shoes, wear them for just a few hours a day. This prevents you from injuring your feet. Always look in your shoes before you put them on to be sure there are no objects inside.  Do not cross your legs. This may decrease the blood flow to your feet.  If you find a minor scrape,  cut, or break in the skin on your feet, keep it and the skin around it clean and dry. These areas may be cleansed with mild soap and water. Do not cleanse the area with peroxide, alcohol, or iodine.  When you remove an adhesive bandage, be sure not to damage the skin around it.  If you have a wound, look at it several times a day to make sure it is healing.  Do not use heating pads or hot water bottles. They may burn your skin. If you have lost feeling in your feet or legs, you may not know it is happening until it is too late.  Make sure your health care provider performs a complete foot exam at least annually or more often if you have foot problems. Report any cuts, sores, or bruises to your health care provider immediately. SEEK MEDICAL CARE IF:   You have an injury that is not healing.  You have cuts or breaks in the skin.  You have an ingrown nail.  You notice redness on your legs or feet.  You feel burning or tingling in your legs or feet.  You have pain or cramps in your legs and feet.  Your legs or feet are numb.  Your feet always feel cold. SEEK IMMEDIATE MEDICAL CARE IF:   There is increasing redness,   swelling, or pain in or around a wound.  There is a red line that goes up your leg.  Pus is coming from a wound.  You develop a fever or as directed by your health care provider.  You notice a bad smell coming from an ulcer or wound. Document Released: 10/18/2000 Document Revised: 06/23/2013 Document Reviewed: 03/30/2013 [employer] Patient Information 2014 [employer].  

## 2013-10-15 ENCOUNTER — Ambulatory Visit (INDEPENDENT_AMBULATORY_CARE_PROVIDER_SITE_OTHER)
Admission: RE | Admit: 2013-10-15 | Discharge: 2013-10-15 | Disposition: A | Discharge status: Home / Self Care | Payer: Medicare Other | Source: Ambulatory Visit | Attending: Neurosurgery | Admitting: Neurosurgery

## 2013-10-15 ENCOUNTER — Ambulatory Visit (INDEPENDENT_AMBULATORY_CARE_PROVIDER_SITE_OTHER): Payer: Medicare Other | Admitting: Family

## 2013-10-15 ENCOUNTER — Ambulatory Visit (HOSPITAL_COMMUNITY)
Admission: RE | Admit: 2013-10-15 | Discharge: 2013-10-15 | Disposition: A | Discharge status: Home / Self Care | Payer: Medicare Other | Source: Ambulatory Visit | Attending: Family | Admitting: Family

## 2013-10-15 ENCOUNTER — Encounter: Payer: Self-pay | Admitting: Family

## 2013-10-15 DIAGNOSIS — I739 Peripheral vascular disease, unspecified: Secondary | ICD-10-CM | POA: Insufficient documentation

## 2013-10-15 DIAGNOSIS — Z48812 Encounter for surgical aftercare following surgery on the circulatory system: Secondary | ICD-10-CM | POA: Insufficient documentation

## 2013-10-15 DIAGNOSIS — I6529 Occlusion and stenosis of unspecified carotid artery: Secondary | ICD-10-CM | POA: Insufficient documentation

## 2013-10-15 NOTE — Progress Notes (Signed)
[facility] HISTORY AND PHYSICAL   MRN : [id number]  History of Present Illness:   [name] is a 72 y.o. male who is status post left great toe amputation by Dr. Oneida Alar in the past with left mid superficial femoral artery to dorsalis pedis artery bypass graft in 2006, left superficial femoral artery endarterectomy in 2009. The patient also had a left CEA in [employer].  He returns today for carotid artery and PAD surveillance.   Pt. denies claudication in legs, uses elliptical exercise machine daily.  Pt. denies rest pain; denies night pain denies non healing ulcers on either lower extremity.  Patient has Negative history of TIA or stroke symptom.  The patient denies amaurosis fugax or monocular blindness.  The patient  denies facial drooping.  Pt. denies hemiplegia.  The patient denies receptive or expressive aphasia.  Pt. denies weakness. The patient denies steal symptoms in either arm. States his blood pressure at home is about 120/55, states he has "white coat syndrome".  Pt Diabetic: Yes, states in good control Pt smoker: former smoker, quit 20 years ago  Current Outpatient Prescriptions  Medication Sig Dispense Refill  . aspirin EC 325 MG tablet Take 325 mg by mouth daily.      . carvedilol (COREG) 12.5 MG tablet Take 12.5 mg by mouth 2 (two) times daily with a meal.      . ezetimibe (ZETIA) 10 MG tablet Take 10 mg by mouth daily.      Marland Kitchen glyBURIDE-metformin (GLUCOVANCE) 5-500 MG per tablet Take 1 tablet by mouth daily with breakfast.      . lisinopril (PRINIVIL,ZESTRIL) 20 MG tablet Take 40 mg by mouth daily.       Marland Kitchen CIPROFLOXACIN HCL OP Apply to eye.        . fenofibrate micronized (ANTARA) 130 MG capsule Take 130 mg by mouth daily before breakfast.        . GLYBURIDE PO Take by mouth.        . METFORMIN HCL PO Take by mouth.        . OXYCODONE HCL PO Take by mouth.        . rosuvastatin (CRESTOR) 40 MG tablet Take 40 mg by mouth daily.       Marland Kitchen warfarin (COUMADIN) 10 MG tablet Take 10 mg by mouth daily.         No current facility-administered medications for this visit.    Pt meds include: Statin :Yes Betablocker: Yes ASA: Yes Other anticoagulants/antiplatelets: no  Past Medical History  Diagnosis Date  . Diabetes mellitus Age 81  . Hyperlipidemia   . Hypertension   . Peripheral vascular disease   . Carotid artery occlusion   . Joint pain   . Ulcer   . Arthritis   . CAD (coronary artery disease)     Past Surgical History  Procedure Laterality Date  . Foot surgery    . Carotid endarterectomy    . Amputation      left first toe  . Pr vein bypass graft,aorto-fem-pop  07/2005    left femoral to dorsalis pedis artery bypass  . Appendectomy      Social History History  Substance Use Topics  . Smoking status: Former Smoker    Quit date: 11/04/1978  . Smokeless tobacco: Never Used  . Alcohol Use: No    Family History Family History  Problem Relation Age of Onset  . Heart disease Father     Heart  Disease before age 54  . Heart attack Father   . Cancer Mother     No Known Allergies   REVIEW OF SYSTEMS:  See HPI for pertinent positives and negatives.  Physical Examination Filed Vitals:   10/15/13 1120 10/15/13 1130  BP: 140/80 185/75  Pulse: 60 60  Resp:  16  Height:  5\' 8"  (1.727 m)  Weight:  201 lb (91.173 kg)  SpO2:  97%   Body mass index is 30.57 kg/(m^2).  General:  WDWN in NAD, obese Gait: Normal HENT: WNL Eyes: Pupils equal Pulmonary: normal non-labored breathing , without Rales, rhonchi,  wheezing Cardiac: RRR with occasional skipped beats , without  Murmurs, rubs or gallops; No carotid bruits Abdomen: soft, NT, no masses Skin: no rashes, ulcers noted;  no Gangrene , no cellulitis; no open wounds;   Vascular Exam/Pulses: VASCULAR EXAM  Carotid Bruits Left Right   Negative Negative                             VASCULAR EXAM: Extremities without ischemic changes   without Gangrene; without open wounds.                                                                                                          LE Pulses LEFT RIGHT       FEMORAL  1+ palpable  2+ palpable        POPLITEAL  not palpable   not palpable       POSTERIOR TIBIAL  not palpable   not palpable        DORSALIS PEDIS      ANTERIOR TIBIAL 3+ palpable  1+ palpable        PERONEAL not Palpable   not Palpable      Musculoskeletal: no muscle wasting or atrophy; no edema, left great toe surgically absent. Neurologic: A&O X 3; Appropriate Affect ;  SENSATION: normal; MOTOR FUNCTION: 5/5 Symmetric, CN 2-12 intact Speech is fluent/normal   Non-Invasive Vascular Imaging )10/15/2013): Carotid Duplex: <40% right ICA stenosis No left ICA stenosis which is the CEA site. No significant change from previous exam a year ago.  Left LE arterial Duplex: Patent left leg bypass graft with no restenosis, no change from 09/02/12.  ABI's: Right: 0.92, biphasic waveforms; Left: 1.08, mono and biphasic waveforms. Previous (09/02/12): Right: 0.91, Left: 1.04 Normal ABI's, no evidence of arterial occlusive disease.   ASSESSMENT:  [name] is a 72 y.o. male who is status post left great toe amputation by Dr. Oneida Alar in the past with left mid superficial femoral artery to dorsalis pedis artery bypass graft in 2006, left superficial femoral artery endarterectomy in 2009. The patient also had a left CEA in [employer].  He has no history of stroke or TIA, has no claudication symptoms, no non-healing wounds.  He has a high exercise tolerance. He has minimal plaque in his right carotid artery and his LICA is patent s/p CEA. His ABI's show no evidence of arterial occlusive  disease and his left leg bypass graft is patent.   PLAN:   Based on today's exam and Duplex results, patient advised to return in 1 year for ABI's, LLE Duplex, and carotid artery Duplex. I discussed in depth with  the patient the nature of atherosclerosis, and emphasized the importance of maximal medical management including strict control of blood pressure, blood glucose, and lipid levels, obtaining regular exercise, and continued cessation of smoking.  The patient is aware that without maximal medical management the underlying atherosclerotic disease process will progress, limiting the benefit of any interventions.  The patient was given information about stroke prevention and what symptoms should prompt the patient to seek immediate medical care.  The patient was given information about PAD including signs, symptoms, treatment, what symptoms should prompt the patient to seek immediate medical care, and risk reduction measures to take.  Clemon Chambers, RN, MSN, FNP-C [facility] Office: [phone]  Clinic MD: Bridgett Larsson 10/15/2013 11:48 AM

## 2013-10-15 NOTE — Patient Instructions (Signed)
Stroke Prevention Some medical conditions and behaviors are associated with an increased chance of having a stroke. You may prevent a stroke by making healthy choices and managing medical conditions. Reduce your risk of having a stroke by:  Staying physically active. Get at least 30 minutes of activity on most or all days.  Not smoking. It may also be helpful to avoid exposure to secondhand smoke.  Limiting alcohol use. Moderate alcohol use is considered to be:  No more than 2 drinks per day for men.  No more than 1 drink per day for nonpregnant women.  Eating healthy foods.  Include 5 or more servings of fruits and vegetables a day.  Certain diets may be prescribed to address high blood pressure, high cholesterol, diabetes, or obesity.  Managing your cholesterol levels.  A low-saturated fat, low-trans fat, low-cholesterol, and high-fiber diet may control cholesterol levels.  Take any prescribed medicines to control cholesterol as directed by your caregiver.  Managing your diabetes.  A controlled-carbohydrate, controlled-sugar diet is recommended to manage diabetes.  Take any prescribed medicines to control diabetes as directed by your caregiver.  Controlling your high blood pressure (hypertension).  A low-salt (sodium), low-saturated fat, low-trans fat, and low-cholesterol diet is recommended to manage high blood pressure.  Take any prescribed medicines to control hypertension as directed by your caregiver.  Maintaining a healthy weight.  A reduced-calorie, low-sodium, low-saturated fat, low-trans fat, low-cholesterol diet is recommended to manage weight.  Stopping drug abuse.  Avoiding birth control pills.  Talk to your caregiver about the risks of taking birth control pills if you are over 35 years old, smoke, get migraines, or have ever had a blood clot.  Getting evaluated for sleep disorders (sleep apnea).  Talk to your caregiver about getting a sleep evaluation  if you snore a lot or have excessive sleepiness.  Taking medicines as directed by your caregiver.  For some people, aspirin or blood thinners (anticoagulants) are helpful in reducing the risk of forming abnormal blood clots that can lead to stroke. If you have the irregular heart rhythm of atrial fibrillation, you should be on a blood thinner unless there is a good reason you cannot take them.  Understand all your medicine instructions. SEEK IMMEDIATE MEDICAL CARE IF:   You have sudden weakness or numbness of the face, arm, or leg, especially on one side of the body.  You have sudden confusion.  You have trouble speaking (aphasia) or understanding.  You have sudden trouble seeing in one or both eyes.  You have sudden trouble walking.  You have dizziness.  You have a loss of balance or coordination.  You have a sudden, severe headache with no known cause.  You have new chest pain or an irregular heartbeat. Any of these symptoms may represent a serious problem that is an emergency. Do not wait to see if the symptoms will go away. Get medical help right away. Call your local emergency services (911 in [place] Do not drive yourself to the hospital. Document Released: 11/28/2004 Document Revised: 01/13/2012 Document Reviewed: 04/23/2013 ExitCare Patient Information 2014 [employer].   Peripheral Vascular Disease Peripheral Vascular Disease (PVD), also called Peripheral Arterial Disease (PAD), is a circulation problem caused by cholesterol (atherosclerotic plaque) deposits in the arteries. PVD commonly occurs in the lower extremities (legs) but it can occur in other areas of the body, such as your arms. The cholesterol buildup in the arteries reduces blood flow which can cause pain and other serious problems. The presence   of PVD can place a person at risk for Coronary Artery Disease (CAD).  CAUSES  Causes of PVD can be many. It is usually associated with more than one risk factor such  as:   High Cholesterol.  Smoking.  Diabetes.  Lack of exercise or inactivity.  High blood pressure (hypertension).  Obesity.  Family history. SYMPTOMS   When the lower extremities are affected, patients with PVD may experience:  Leg pain with exertion or physical activity. This is called INTERMITTENT CLAUDICATION. This may present as cramping or numbness with physical activity. The location of the pain is associated with the level of blockage. For example, blockage at the abdominal level (distal abdominal aorta) may result in buttock or hip pain. Lower leg arterial blockage may result in calf pain.  As PVD becomes more severe, pain can develop with less physical activity.  In people with severe PVD, leg pain may occur at rest.  Other PVD signs and symptoms:  Leg numbness or weakness.  Coldness in the affected leg or foot, especially when compared to the other leg.  A change in leg color.  Patients with significant PVD are more prone to ulcers or sores on toes, feet or legs. These may take longer to heal or may reoccur. The ulcers or sores can become infected.  If signs and symptoms of PVD are ignored, gangrene may occur. This can result in the loss of toes or loss of an entire limb.  Not all leg pain is related to PVD. Other medical conditions can cause leg pain such as:  Blood clots (embolism) or Deep Vein Thrombosis.  Inflammation of the blood vessels (vasculitis).  Spinal stenosis. DIAGNOSIS  Diagnosis of PVD can involve several different types of tests. These can include:  Pulse Volume Recording Method (PVR). This test is simple, painless and does not involve the use of X-rays. PVR involves measuring and comparing the blood pressure in the arms and legs. An ABI (Ankle-Brachial Index) is calculated. The normal ratio of blood pressures is 1. As this number becomes smaller, it indicates more severe disease.  < 0.95  indicates significant narrowing in one or more leg  vessels.  <0.8 there will usually be pain in the foot, leg or buttock with exercise.  <0.4 will usually have pain in the legs at rest.  <0.25  usually indicates limb threatening PVD.  Doppler detection of pulses in the legs. This test is painless and checks to see if you have a pulses in your legs/feet.  A dye or contrast material (a substance that highlights the blood vessels so they show up on x-ray) may be given to help your caregiver better see the arteries for the following tests. The dye is eliminated from your body by the kidney's. Your caregiver may order blood work to check your kidney function and other laboratory values before the following tests are performed:  Magnetic Resonance Angiography (MRA). An MRA is a picture study of the blood vessels and arteries. The MRA machine uses a large magnet to produce images of the blood vessels.  Computed Tomography Angiography (CTA). A CTA is a specialized x-ray that looks at how the blood flows in your blood vessels. An IV may be inserted into your arm so contrast dye can be injected.  Angiogram. Is a procedure that uses x-rays to look at your blood vessels. This procedure is minimally invasive, meaning a small incision (cut) is made in your groin. A small tube (catheter) is then inserted into the artery   of your groin. The catheter is guided to the blood vessel or artery your caregiver wants to examine. Contrast dye is injected into the catheter. X-rays are then taken of the blood vessel or artery. After the images are obtained, the catheter is taken out. TREATMENT  Treatment of PVD involves many interventions which may include:  Lifestyle changes:  Quitting smoking.  Exercise.  Following a low fat, low cholesterol diet.  Control of diabetes.  Foot care is very important to the PVD patient. Good foot care can help prevent infection.  Medication:  Cholesterol-lowering medicine.  Blood pressure medicine.  Anti-platelet  drugs.  Certain medicines may reduce symptoms of Intermittent Claudication.  Interventional/Surgical options:  Angioplasty. An Angioplasty is a procedure that inflates a balloon in the blocked artery. This opens the blocked artery to improve blood flow.  Stent Implant. A wire mesh tube (stent) is placed in the artery. The stent expands and stays in place, allowing the artery to remain open.  Peripheral Bypass Surgery. This is a surgical procedure that reroutes the blood around a blocked artery to help improve blood flow. This type of procedure may be performed if Angioplasty or stent implants are not an option. SEEK IMMEDIATE MEDICAL CARE IF:   You develop pain or numbness in your arms or legs.  Your arm or leg turns cold, becomes blue in color.  You develop redness, warmth, swelling and pain in your arms or legs. MAKE SURE YOU:   Understand these instructions.  Will watch your condition.  Will get help right away if you are not doing well or get worse. Document Released: 11/28/2004 Document Revised: 01/13/2012 Document Reviewed: 10/25/2008 ExitCare Patient Information 2014 [employer].  

## 2014-01-13 ENCOUNTER — Ambulatory Visit (INDEPENDENT_AMBULATORY_CARE_PROVIDER_SITE_OTHER): Payer: Medicare Other

## 2014-01-13 VITALS — BP 142/53 | HR 81 | Resp 18

## 2014-01-13 DIAGNOSIS — E1149 Type 2 diabetes mellitus with other diabetic neurological complication: Secondary | ICD-10-CM

## 2014-01-13 DIAGNOSIS — E1140 Type 2 diabetes mellitus with diabetic neuropathy, unspecified: Secondary | ICD-10-CM

## 2014-01-13 DIAGNOSIS — L608 Other nail disorders: Secondary | ICD-10-CM

## 2014-01-13 DIAGNOSIS — Q828 Other specified congenital malformations of skin: Secondary | ICD-10-CM

## 2014-01-13 DIAGNOSIS — I739 Peripheral vascular disease, unspecified: Secondary | ICD-10-CM

## 2014-01-13 NOTE — Progress Notes (Signed)
   Subjective:    Patient ID: [name], male    DOB: 28-Mar-1941, 73 y.o.   MRN: [id number]  HPI  I am here to get my nails trimmed and I need new diabetic shoes and Dr. Charletta Cousin is my doctor    Review of Systems no new changes or findings    Objective:   Physical Exam Vascular status is intact but diminished bilateral there is thready PT pulse one over 4 bilateral dorsalis pedis two over four bilateral Refill time 3 seconds all digits skin temperature warm to cool turgor diminished neurologically epicritic and proprioceptive sensation diminished on Semmes Weinstein testing to forefoot digits arch and mid to the heel areas well thick brittle friable criptotic nails x9 status post amputation of left great toe in the past. Is keratoses sub-first right sub-14 left and sub-5 right patient has rigid digital contractures hammertoe deformities and bunion deformities bilateral with also some neuromuscular manifestations of neuropathy. No active wounds or ulcerations noted this time no secondary infections at this time.       Assessment & Plan:  Assessment diabetes with peripheral neuropathy multiple dystrophic from criptotic nails debrided x9 also multiple keratoses debrided of bilateral feet sub-1415 bilateral patient will continue to followup at this time get authorizations for  Diabetic accident shoes and custom insoles we'll continue with proper shoe modifications and preventative measures in the future as needed reappointed 3 months for continued palliative nail care we'll contact patient was ready for shoe fitting and measurement and dispensing fitting shoes.  Harriet Masson DPM

## 2014-01-13 NOTE — Patient Instructions (Signed)
Diabetes and Foot Care Diabetes may cause you to have problems because of poor blood supply (circulation) to your feet and legs. This may cause the skin on your feet to become thinner, break easier, and heal more slowly. Your skin may become dry, and the skin may peel and crack. You may also have nerve damage in your legs and feet causing decreased feeling in them. You may not notice minor injuries to your feet that could lead to infections or more serious problems. Taking care of your feet is one of the most important things you can do for yourself.  HOME CARE INSTRUCTIONS  Wear shoes at all times, even in the house. Do not go barefoot. Bare feet are easily injured.  Check your feet daily for blisters, cuts, and redness. If you cannot see the bottom of your feet, use a mirror or ask someone for help.  Wash your feet with warm water (do not use hot water) and mild soap. Then pat your feet and the areas between your toes until they are completely dry. Do not soak your feet as this can dry your skin.  Apply a moisturizing lotion or petroleum jelly (that does not contain alcohol and is unscented) to the skin on your feet and to dry, brittle toenails. Do not apply lotion between your toes.  Trim your toenails straight across. Do not dig under them or around the cuticle. File the edges of your nails with an emery board or nail file.  Do not cut corns or calluses or try to remove them with medicine.  Wear clean socks or stockings every day. Make sure they are not too tight. Do not wear knee-high stockings since they may decrease blood flow to your legs.  Wear shoes that fit properly and have enough cushioning. To break in new shoes, wear them for just a few hours a day. This prevents you from injuring your feet. Always look in your shoes before you put them on to be sure there are no objects inside.  Do not cross your legs. This may decrease the blood flow to your feet.  If you find a minor scrape,  cut, or break in the skin on your feet, keep it and the skin around it clean and dry. These areas may be cleansed with mild soap and water. Do not cleanse the area with peroxide, alcohol, or iodine.  When you remove an adhesive bandage, be sure not to damage the skin around it.  If you have a wound, look at it several times a day to make sure it is healing.  Do not use heating pads or hot water bottles. They may burn your skin. If you have lost feeling in your feet or legs, you may not know it is happening until it is too late.  Make sure your health care provider performs a complete foot exam at least annually or more often if you have foot problems. Report any cuts, sores, or bruises to your health care provider immediately. SEEK MEDICAL CARE IF:   You have an injury that is not healing.  You have cuts or breaks in the skin.  You have an ingrown nail.  You notice redness on your legs or feet.  You feel burning or tingling in your legs or feet.  You have pain or cramps in your legs and feet.  Your legs or feet are numb.  Your feet always feel cold. SEEK IMMEDIATE MEDICAL CARE IF:   There is increasing redness,   swelling, or pain in or around a wound.  There is a red line that goes up your leg.  Pus is coming from a wound.  You develop a fever or as directed by your health care provider.  You notice a bad smell coming from an ulcer or wound. Document Released: 10/18/2000 Document Revised: 06/23/2013 Document Reviewed: 03/30/2013 [employer] Patient Information 2014 [employer].  

## 2014-04-14 ENCOUNTER — Ambulatory Visit (INDEPENDENT_AMBULATORY_CARE_PROVIDER_SITE_OTHER): Payer: Medicare Other

## 2014-04-14 VITALS — BP 125/59 | HR 69 | Resp 18

## 2014-04-14 DIAGNOSIS — E1140 Type 2 diabetes mellitus with diabetic neuropathy, unspecified: Secondary | ICD-10-CM

## 2014-04-14 DIAGNOSIS — E1142 Type 2 diabetes mellitus with diabetic polyneuropathy: Secondary | ICD-10-CM

## 2014-04-14 DIAGNOSIS — L608 Other nail disorders: Secondary | ICD-10-CM

## 2014-04-14 DIAGNOSIS — Q828 Other specified congenital malformations of skin: Secondary | ICD-10-CM

## 2014-04-14 DIAGNOSIS — I739 Peripheral vascular disease, unspecified: Secondary | ICD-10-CM

## 2014-04-14 DIAGNOSIS — E1149 Type 2 diabetes mellitus with other diabetic neurological complication: Secondary | ICD-10-CM

## 2014-04-14 NOTE — Progress Notes (Signed)
   Subjective:    Patient ID: [name], male    DOB: 1941-04-13, 73 y.o.   MRN: [id number]  HPI I need my toenails cut and I am getting my shoes today    Review of Systems no new findings or systemic changes noted    Objective:   Physical Exam Lower extremity objective findings as follows vascular status is intact although diminished PT pulse in DP pulse one over 4 bilateral thready capillary refill time 3 seconds epicritic and proprioceptive sensations intact and symmetric there is is too" left great toe diabetes complications and submission neurovascular status patient has a decreased sensation to the toes plantar arch and foot assistant diabetic neuropathy nails thick brittle crumbly brittle crumbly friable and criptotic 1 through 5 bilateral she 1 through 5 right 12 through 5 left. Following debridement this time the right hallux nails treated with lumicain Neosporin and Band-Aid dressing maintain Neosporin and Band-Aid dressing for couple days if needed no open wounds or ulcerations are noted there is some diffuse keratoses sub-first right and sub-fourth left which are debrided although no open wounds or ulcers are identified at this time dispensed 1 pair of diabetic shoes and 3 pairs of custom molded dual density inlays which fit and contour well to the foot patient replacing his shoes these are identical to his previous shoe going to bring in the cyma 3 sets of insoles been provided a fit and contour well to the patient's foot with full contact being noted       Assessment & Plan:  Assessment diabetes with peripheral neuropathy and angiopathy complications history partial amputation of toe left hallux patient presents at this time for debridement of nails thick and dystrophic orthotic criptotic nails x9 are debrided at this time also debrided some diffuse keratoses sub-1 left sub-for right will maintain diabetic shoes which are dispensed at this time written oral instructions for use in  shoe and break in reviewed continue with and future diabetic foot and nail care and as-needed basis suggest 3 month followup next  Harriet Masson DPM

## 2014-04-14 NOTE — Patient Instructions (Signed)
Diabetes and Foot Care Diabetes may cause you to have problems because of poor blood supply (circulation) to your feet and legs. This may cause the skin on your feet to become thinner, break easier, and heal more slowly. Your skin may become dry, and the skin may peel and crack. You may also have nerve damage in your legs and feet causing decreased feeling in them. You may not notice minor injuries to your feet that could lead to infections or more serious problems. Taking care of your feet is one of the most important things you can do for yourself.  HOME CARE INSTRUCTIONS  Wear shoes at all times, even in the house. Do not go barefoot. Bare feet are easily injured.  Check your feet daily for blisters, cuts, and redness. If you cannot see the bottom of your feet, use a mirror or ask someone for help.  Wash your feet with warm water (do not use hot water) and mild soap. Then pat your feet and the areas between your toes until they are completely dry. Do not soak your feet as this can dry your skin.  Apply a moisturizing lotion or petroleum jelly (that does not contain alcohol and is unscented) to the skin on your feet and to dry, brittle toenails. Do not apply lotion between your toes.  Trim your toenails straight across. Do not dig under them or around the cuticle. File the edges of your nails with an emery board or nail file.  Do not cut corns or calluses or try to remove them with medicine.  Wear clean socks or stockings every day. Make sure they are not too tight. Do not wear knee-high stockings since they may decrease blood flow to your legs.  Wear shoes that fit properly and have enough cushioning. To break in new shoes, wear them for just a few hours a day. This prevents you from injuring your feet. Always look in your shoes before you put them on to be sure there are no objects inside.  Do not cross your legs. This may decrease the blood flow to your feet.  If you find a minor scrape,  cut, or break in the skin on your feet, keep it and the skin around it clean and dry. These areas may be cleansed with mild soap and water. Do not cleanse the area with peroxide, alcohol, or iodine.  When you remove an adhesive bandage, be sure not to damage the skin around it.  If you have a wound, look at it several times a day to make sure it is healing.  Do not use heating pads or hot water bottles. They may burn your skin. If you have lost feeling in your feet or legs, you may not know it is happening until it is too late.  Make sure your health care provider performs a complete foot exam at least annually or more often if you have foot problems. Report any cuts, sores, or bruises to your health care provider immediately. SEEK MEDICAL CARE IF:   You have an injury that is not healing.  You have cuts or breaks in the skin.  You have an ingrown nail.  You notice redness on your legs or feet.  You feel burning or tingling in your legs or feet.  You have pain or cramps in your legs and feet.  Your legs or feet are numb.  Your feet always feel cold. SEEK IMMEDIATE MEDICAL CARE IF:   There is increasing redness,   swelling, or pain in or around a wound.  There is a red line that goes up your leg.  Pus is coming from a wound.  You develop a fever or as directed by your health care provider.  You notice a bad smell coming from an ulcer or wound. Document Released: 10/18/2000 Document Revised: 06/23/2013 Document Reviewed: 03/30/2013 [employer] Patient Information 2014 [employer].  

## 2014-07-14 ENCOUNTER — Ambulatory Visit (INDEPENDENT_AMBULATORY_CARE_PROVIDER_SITE_OTHER): Payer: Medicare Other

## 2014-07-14 VITALS — BP 115/56 | HR 70 | Resp 18

## 2014-07-14 DIAGNOSIS — L608 Other nail disorders: Secondary | ICD-10-CM

## 2014-07-14 DIAGNOSIS — E1142 Type 2 diabetes mellitus with diabetic polyneuropathy: Secondary | ICD-10-CM

## 2014-07-14 DIAGNOSIS — Q828 Other specified congenital malformations of skin: Secondary | ICD-10-CM

## 2014-07-14 DIAGNOSIS — E1140 Type 2 diabetes mellitus with diabetic neuropathy, unspecified: Secondary | ICD-10-CM

## 2014-07-14 DIAGNOSIS — E1149 Type 2 diabetes mellitus with other diabetic neurological complication: Secondary | ICD-10-CM

## 2014-07-14 DIAGNOSIS — I739 Peripheral vascular disease, unspecified: Secondary | ICD-10-CM

## 2014-07-14 NOTE — Patient Instructions (Signed)
Diabetes and Foot Care Diabetes may cause you to have problems because of poor blood supply (circulation) to your feet and legs. This may cause the skin on your feet to become thinner, break easier, and heal more slowly. Your skin may become dry, and the skin may peel and crack. You may also have nerve damage in your legs and feet causing decreased feeling in them. You may not notice minor injuries to your feet that could lead to infections or more serious problems. Taking care of your feet is one of the most important things you can do for yourself.  HOME CARE INSTRUCTIONS  Wear shoes at all times, even in the house. Do not go barefoot. Bare feet are easily injured.  Check your feet daily for blisters, cuts, and redness. If you cannot see the bottom of your feet, use a mirror or ask someone for help.  Wash your feet with warm water (do not use hot water) and mild soap. Then pat your feet and the areas between your toes until they are completely dry. Do not soak your feet as this can dry your skin.  Apply a moisturizing lotion or petroleum jelly (that does not contain alcohol and is unscented) to the skin on your feet and to dry, brittle toenails. Do not apply lotion between your toes.  Trim your toenails straight across. Do not dig under them or around the cuticle. File the edges of your nails with an emery board or nail file.  Do not cut corns or calluses or try to remove them with medicine.  Wear clean socks or stockings every day. Make sure they are not too tight. Do not wear knee-high stockings since they may decrease blood flow to your legs.  Wear shoes that fit properly and have enough cushioning. To break in new shoes, wear them for just a few hours a day. This prevents you from injuring your feet. Always look in your shoes before you put them on to be sure there are no objects inside.  Do not cross your legs. This may decrease the blood flow to your feet.  If you find a minor scrape,  cut, or break in the skin on your feet, keep it and the skin around it clean and dry. These areas may be cleansed with mild soap and water. Do not cleanse the area with peroxide, alcohol, or iodine.  When you remove an adhesive bandage, be sure not to damage the skin around it.  If you have a wound, look at it several times a day to make sure it is healing.  Do not use heating pads or hot water bottles. They may burn your skin. If you have lost feeling in your feet or legs, you may not know it is happening until it is too late.  Make sure your health care provider performs a complete foot exam at least annually or more often if you have foot problems. Report any cuts, sores, or bruises to your health care provider immediately. SEEK MEDICAL CARE IF:   You have an injury that is not healing.  You have cuts or breaks in the skin.  You have an ingrown nail.  You notice redness on your legs or feet.  You feel burning or tingling in your legs or feet.  You have pain or cramps in your legs and feet.  Your legs or feet are numb.  Your feet always feel cold. SEEK IMMEDIATE MEDICAL CARE IF:   There is increasing redness,   swelling, or pain in or around a wound.  There is a red line that goes up your leg.  Pus is coming from a wound.  You develop a fever or as directed by your health care provider.  You notice a bad smell coming from an ulcer or wound. Document Released: 10/18/2000 Document Revised: 06/23/2013 Document Reviewed: 03/30/2013 ExitCare Patient Information 2015 [employer]. This information is not intended to replace advice given to you by your health care provider. Make sure you discuss any questions you have with your health care provider.  

## 2014-07-14 NOTE — Progress Notes (Signed)
   Subjective:    Patient ID: [name], male    DOB: 25-Apr-1941, 73 y.o.   MRN: [id number]  HPI I NEED MY TOENAILS TRIMMED UP AND MY CALLUSES ALSO NEED TO BE TRIMMED UP    Review of Systems No new findings or systemic changes noted at this time.    Objective:   Physical Exam Vascular status unchanged pedal pulses DP and PT plus one over 4 bilateral mild varicosities noted Refill time 3 seconds all digits temperature warm turgor diminished no rubor pallor noted neurologically epicritic and proprioceptive sensations diminished on Semmes Weinstein to forefoot digits plantar arch consistent with neuropathy and also decreased vibratory sensation plantar response intact DTRs not elicited dermatologically skin color pigment normal hair growth absent nails thick brittle crumbly friable 1 through 5 bilateral tender both on palpation and with debridement patient also has multiple keratoses pinch callus both great toes sub-first and sub-second MTP area bilateral also sub-fifth bilateral keratotic lesions are debrided areas of nails right hallux in third digit right as well and sub-first MTP area right treated with lumicain and Neosporin this time patient will monitor for any exacerbations or difficulties patient does have diabetes with history of complications in the past. No active wounds or ulcers or infections noted       Assessment & Plan:  Assessment this time his diabetes with history peripheral neuropathy and angiopathy. Patient had amputation of his left great toe in the past complication from his diabetes and vascular compromise remaining nails debrided 1 through 5 right 2 through 5 left also multiple 4 keratoses sub-1 and 5 bilateral debrided return for future palliative nail care as needed suggest 3 month followup  Harriet Masson DPM

## 2014-10-05 ENCOUNTER — Encounter: Payer: Self-pay | Admitting: Family

## 2014-10-06 ENCOUNTER — Ambulatory Visit (INDEPENDENT_AMBULATORY_CARE_PROVIDER_SITE_OTHER)
Admission: RE | Admit: 2014-10-06 | Discharge: 2014-10-06 | Disposition: A | Discharge status: Home / Self Care | Payer: Medicare Other | Source: Ambulatory Visit | Attending: Family | Admitting: Family

## 2014-10-06 ENCOUNTER — Encounter: Payer: Self-pay | Admitting: Family

## 2014-10-06 ENCOUNTER — Ambulatory Visit (HOSPITAL_COMMUNITY)
Admission: RE | Admit: 2014-10-06 | Discharge: 2014-10-06 | Disposition: A | Discharge status: Home / Self Care | Payer: Medicare Other | Source: Ambulatory Visit | Attending: Family | Admitting: Family

## 2014-10-06 ENCOUNTER — Ambulatory Visit (INDEPENDENT_AMBULATORY_CARE_PROVIDER_SITE_OTHER): Payer: Medicare Other | Admitting: Family

## 2014-10-06 VITALS — BP 162/74 | HR 66 | Resp 16 | Ht 68.0 in | Wt 183.0 lb

## 2014-10-06 DIAGNOSIS — I6523 Occlusion and stenosis of bilateral carotid arteries: Secondary | ICD-10-CM

## 2014-10-06 DIAGNOSIS — Z48812 Encounter for surgical aftercare following surgery on the circulatory system: Secondary | ICD-10-CM

## 2014-10-06 DIAGNOSIS — Z95828 Presence of other vascular implants and grafts: Secondary | ICD-10-CM

## 2014-10-06 DIAGNOSIS — I739 Peripheral vascular disease, unspecified: Secondary | ICD-10-CM

## 2014-10-06 NOTE — Progress Notes (Signed)
[facility] [place] HISTORY AND PHYSICAL   MRN : [id number]  History of Present Illness:   [name] is a 73 y.o. male who is status post left great toe amputation by Dr. Oneida Alar with left mid superficial femoral artery to dorsalis pedis artery bypass graft in 2006, left superficial femoral artery endarterectomy in 2009. The patient also had a left CEA in [employer].  He returns today for carotid artery and PAD surveillance.  Pt reports tired feeling in both calves after walking about 1.5 miles, uses elliptical exercise machine daily.  Pt. denies rest pain; denies night pain denies non healing ulcers on either lower extremity.  Patient has Negative history of TIA or stroke symptom. The patient denies amaurosis fugax or monocular blindness. The patient denies facial drooping. Pt. denies hemiplegia. The patient denies receptive or expressive aphasia. Pt. denies weakness. The patient denies steal symptoms in either arm. States his blood pressure at home is about 120/55, states he has "white coat syndrome".  Pt Diabetic: Yes, states in good control since he started Victoza along with weight loss Pt smoker: former smoker, quit about 1994   Pt meds include: Statin :Yes Betablocker: Yes ASA: Yes Other anticoagulants/antiplatelets: no   Current Outpatient Prescriptions  Medication Sig Dispense Refill  . aspirin EC 325 MG tablet Take 325 mg by mouth daily.    . carvedilol (COREG) 12.5 MG tablet Take 12.5 mg by mouth 2 (two) times daily with a meal.    . ezetimibe (ZETIA) 10 MG tablet Take 10 mg by mouth daily.    . fenofibrate 160 MG tablet     . Liraglutide (VICTOZA [place]) Inject 1.2 Units into the skin daily.    Marland Kitchen lisinopril (PRINIVIL,ZESTRIL) 20 MG tablet Take 40 mg by mouth daily.     . rosuvastatin (CRESTOR) 10 MG tablet Take 20 mg by mouth.    Marland Kitchen aspirin EC 325 MG tablet Take 325 mg by mouth.    Marland Kitchen CIPROFLOXACIN HCL OP Apply to eye.      .  ezetimibe (ZETIA) 10 MG tablet Take 10 mg by mouth.    . fenofibrate micronized (ANTARA) 130 MG capsule Take 130 mg by mouth daily before breakfast.      . GLYBURIDE PO Take by mouth.      . glyBURIDE-metformin (GLUCOVANCE) 5-500 MG per tablet Take 1 tablet by mouth daily with breakfast.    . METFORMIN HCL PO Take by mouth.      . OXYCODONE HCL PO Take by mouth.      . rosuvastatin (CRESTOR) 40 MG tablet Take 40 mg by mouth daily.    Marland Kitchen warfarin (COUMADIN) 10 MG tablet Take 10 mg by mouth daily.       No current facility-administered medications for this visit.    Past Medical History  Diagnosis Date  . Diabetes mellitus Age 8  . Hyperlipidemia   . Hypertension   . Peripheral vascular disease   . Carotid artery occlusion   . Joint pain   . Ulcer   . Arthritis   . CAD (coronary artery disease)     Social History History  Substance Use Topics  . Smoking status: Former Smoker    Quit date: 11/04/1978  . Smokeless tobacco: Never Used  . Alcohol Use: No    Family History Family History  Problem Relation Age of Onset  . Heart disease Father     Heart Disease before age 62  . Heart attack Father   .  Cancer Mother     Bladder    Surgical History Past Surgical History  Procedure Laterality Date  . Foot surgery    . Amputation      left first toe  . Pr vein bypass graft,aorto-fem-pop  07/2005    left femoral to dorsalis pedis artery bypass  . Appendectomy    . Carotid endarterectomy Left 2000    CE    No Known Allergies  Current Outpatient Prescriptions  Medication Sig Dispense Refill  . aspirin EC 325 MG tablet Take 325 mg by mouth daily.    . carvedilol (COREG) 12.5 MG tablet Take 12.5 mg by mouth 2 (two) times daily with a meal.    . ezetimibe (ZETIA) 10 MG tablet Take 10 mg by mouth daily.    . fenofibrate 160 MG tablet     . Liraglutide (VICTOZA [place]) Inject 1.2 Units into the skin daily.    Marland Kitchen lisinopril (PRINIVIL,ZESTRIL) 20 MG tablet Take 40 mg by mouth  daily.     . rosuvastatin (CRESTOR) 10 MG tablet Take 20 mg by mouth.    Marland Kitchen aspirin EC 325 MG tablet Take 325 mg by mouth.    Marland Kitchen CIPROFLOXACIN HCL OP Apply to eye.      . ezetimibe (ZETIA) 10 MG tablet Take 10 mg by mouth.    . fenofibrate micronized (ANTARA) 130 MG capsule Take 130 mg by mouth daily before breakfast.      . GLYBURIDE PO Take by mouth.      . glyBURIDE-metformin (GLUCOVANCE) 5-500 MG per tablet Take 1 tablet by mouth daily with breakfast.    . METFORMIN HCL PO Take by mouth.      . OXYCODONE HCL PO Take by mouth.      . rosuvastatin (CRESTOR) 40 MG tablet Take 40 mg by mouth daily.    Marland Kitchen warfarin (COUMADIN) 10 MG tablet Take 10 mg by mouth daily.       No current facility-administered medications for this visit.     REVIEW OF SYSTEMS: See HPI for pertinent positives and negatives.  Physical Examination Filed Vitals:   10/06/14 1038 10/06/14 1040  BP: 180/86 162/74  Pulse: 75 66  Resp:  16  Height:  5\' 8"  (1.727 m)  Weight:  183 lb (83.008 kg)  SpO2:  99%   Body mass index is 27.83 kg/(m^2).   General: WDWN male in NAD Gait: Normal HENT: WNL Eyes: Pupils equal Pulmonary: normal non-labored breathing , without Rales, rhonchi, wheezing Cardiac: RRR with occasional skipped beats, no detectedmurmur  Abdomen: soft, NT, no masses palpated Skin: no rashes, ulcers noted; no Gangrene , no cellulitis; no open wounds.   VASCULAR EXAM  Carotid Bruits Left Right   Negative Negative  Aorta is not palpable radial pulses are 2+ palpable and =    Extremities without ischemic changes  without Gangrene; without open wounds.     LE Pulses LEFT RIGHT   FEMORAL 1+ palpable 2+ palpable    POPLITEAL not palpable  not palpable   POSTERIOR TIBIAL not palpable  not  palpable    DORSALIS PEDIS  ANTERIOR TIBIAL 3+ palpable  2+ palpable    PERONEAL not Palpable   not Palpable     Musculoskeletal: no muscle wasting or atrophy; no peripheral edema, left great toe surgically absent. Neurologic: A&O X 3; Appropriate Affect ;  SENSATION: normal; MOTOR FUNCTION: 5/5 Symmetric, CN 2-12 intact Speech is fluent/normal    Non-Invasive Vascular Imaging (10/06/2014):   CEREBROVASCULAR  DUPLEX EVALUATION    INDICATION: Carotid artery disease    PREVIOUS INTERVENTION(S): Left carotid endarterectomy 2000 [place]    DUPLEX EXAM: Carotid duplex    RIGHT  LEFT  Peak Systolic Velocities (cm/s) End Diastolic Velocities (cm/s) Plaque LOCATION Peak Systolic Velocities (cm/s) End Diastolic Velocities (cm/s) Plaque  120 11 - CCA PROXIMAL 78 15 -  88 16 HT CCA MID 89 17 HT  70 18 HT CCA DISTAL 91 21 -  121 11 HT ECA 152 13 HT  93 15 HT ICA PROXIMAL 63 15 -  84 24 - ICA MID 82 22 -  92 25 - ICA DISTAL 99 27 -    1.05 ICA / CCA Ratio (PSV) N/A  Antegrade Vertebral Flow Antegrade  [id number] Brachial Systolic Pressure (mmHg) [id number]  Triphasic Brachial Artery Waveforms Triphasic    Plaque Morphology:  HM = Homogeneous, HT = Heterogeneous, CP = Calcific Plaque, SP = Smooth Plaque, IP = Irregular Plaque     ADDITIONAL FINDINGS:     IMPRESSION: 1. Less than 40% right internal carotid artery stenosis. 2. Patent left carotid endarterectomy site with no evidence for restenosis.    Compared to the previous exam:  No significant change       LOWER EXTREMITY ARTERIAL DUPLEX EVALUATION    INDICATION: Follow up left lower extremity    PREVIOUS INTERVENTION(S): Left mid superficial femoral artery to dorsalis pedis artery bypass graft 07/17/2005. Left superficial femoral artery endarterectomy 09/01/2008    DUPLEX EXAM:     RIGHT  LEFT   Peak Systolic Velocity (cm/s) Ratio (if abnormal) Waveform  Peak Systolic Velocity (cm/s) Ratio (if  abnormal) Waveform     Inflow Artery 24  B     Proximal Anastomosis 16  B     Proximal Graft 31  B     Mid Graft 53  B      Distal Graft 78  B     Distal Anastomosis 106  B     Outflow Artery 171  B  .94 Today's ABI / TBI 1.10  .92 Previous ABI / TBI ( 10/15/13 ) 1.08    Waveform:    M - Monophasic       B - Biphasic       T - Triphasic  If Ankle Brachial Index (ABI) or Toe Brachial Index (TBI) performed, please see complete report     ADDITIONAL FINDINGS:     IMPRESSION: 1. Patent left superficial femoral artery to dorsalis pedis bypass graft.    Compared to the previous exam:  No change     ASSESSMENT:  [name] is a 73 y.o. male who is status post left great toe amputation by Dr. Oneida Alar with left mid superficial femoral artery to dorsalis pedis artery bypass graft in 2006, left superficial femoral artery endarterectomy in 2009. The patient also had a left CEA in [employer].  He has mild claudication symptoms, has good exercise capacity, no tissue loss. He has no history of stroke or TIA. Today's carotid Duplex reveals less than 40% right internal carotid artery stenosis and a patent left carotid endarterectomy site with no evidence for restenosis; no significant change from previous Duplex. Today's left LE arterial Duplex reveals a patent left superficial femoral artery to dorsalis pedis bypass graft, no change from a year ago. ABI's remain in the normal range with monophasic waveforms in the right LE and mono and biphasic waveforms in the left LE. His DM is in good control  and he stopped smoking in 1994.   PLAN:   Based on today's exam and non-invasive vascular lab results, the patient will follow up in 1 year with the following tests: carotid Duplex, left LE arterial Duplex, ABI's. I discussed in depth with the patient the nature of atherosclerosis, and emphasized the importance of maximal medical management including strict control of blood pressure, blood  glucose, and lipid levels, obtaining regular exercise, and cessation of smoking.  The patient is aware that without maximal medical management the underlying atherosclerotic disease process will progress, limiting the benefit of any interventions.  The patient was given information about stroke prevention and what symptoms should prompt the patient to seek immediate medical care.  The patient was given information about PAD including signs, symptoms, treatment, what symptoms should prompt the patient to seek immediate medical care, and risk reduction measures to take. Thank you for allowing [place] to participate in this patient's care.  Clemon Chambers, RN, MSN, FNP-C [facility] Office: [phone]  Clinic MD: [facility] 10/06/2014 10:44 AM

## 2014-10-06 NOTE — Patient Instructions (Signed)

## 2014-10-10 ENCOUNTER — Ambulatory Visit (INDEPENDENT_AMBULATORY_CARE_PROVIDER_SITE_OTHER): Payer: Medicare Other

## 2014-10-10 VITALS — BP 122/64 | HR 82 | Resp 12

## 2014-10-10 DIAGNOSIS — Q828 Other specified congenital malformations of skin: Secondary | ICD-10-CM

## 2014-10-10 DIAGNOSIS — L603 Nail dystrophy: Secondary | ICD-10-CM

## 2014-10-10 DIAGNOSIS — E1140 Type 2 diabetes mellitus with diabetic neuropathy, unspecified: Secondary | ICD-10-CM

## 2014-10-10 NOTE — Progress Notes (Signed)
   Subjective:    Patient ID: [name], male    DOB: 09/13/1941, 73 y.o.   MRN: [id number]  HPI  TOENAILS AND CALLUS TRIM.  Review of Systems no new findings or systemic changes     Objective:   Physical Exam Neurovascular status is unchanged pulses DP and PT plus one over 4 bilateral varicosities noted 3 seconds capillary refill time. Epicritic and proprioceptive sensations diminished to forefoot digits and arch consistent with diabetic neuropathy there is normal plantar response DTRs not elicited. Dermatologically skin color pigment normal hair growth absent nails thick brittle Crumley friable dystrophic 1 through 5 bilateral also keratoses noted sub-hallux pinch callus bilateral sub-first and subsecond area bilateral and sub-fifth left. Keratotic lesions are give debrided no open wounds or active ulcers no secondary infections at this time. His callus on the right is debrided and treated with limited cane and Neosporin and Band-Aid.       Assessment & Plan:  Assessment this time his diabetes with history peripheral neuropathy and angiopathy painful dystrophic from criptotic nails 1 through 5 bilateral are debrided at this time also multiple keratoses sub-1 and sub-5 bilateral and pinch callus the hallux are right are debrided at this time return for future palliative and nail care in 3 months or as recommended as-needed basis.  Harriet Masson DPM

## 2014-12-13 DIAGNOSIS — E785 Hyperlipidemia, unspecified: Secondary | ICD-10-CM | POA: Diagnosis not present

## 2014-12-13 DIAGNOSIS — I1310 Hypertensive heart and chronic kidney disease without heart failure, with stage 1 through stage 4 chronic kidney disease, or unspecified chronic kidney disease: Secondary | ICD-10-CM | POA: Diagnosis not present

## 2014-12-13 DIAGNOSIS — R809 Proteinuria, unspecified: Secondary | ICD-10-CM | POA: Diagnosis not present

## 2014-12-13 DIAGNOSIS — N183 Chronic kidney disease, stage 3 (moderate): Secondary | ICD-10-CM | POA: Diagnosis not present

## 2014-12-13 DIAGNOSIS — E1165 Type 2 diabetes mellitus with hyperglycemia: Secondary | ICD-10-CM | POA: Diagnosis not present

## 2014-12-13 DIAGNOSIS — E1159 Type 2 diabetes mellitus with other circulatory complications: Secondary | ICD-10-CM | POA: Diagnosis not present

## 2014-12-13 DIAGNOSIS — Z79899 Other long term (current) drug therapy: Secondary | ICD-10-CM | POA: Diagnosis not present

## 2014-12-13 DIAGNOSIS — E559 Vitamin D deficiency, unspecified: Secondary | ICD-10-CM | POA: Diagnosis not present

## 2014-12-13 DIAGNOSIS — E1122 Type 2 diabetes mellitus with diabetic chronic kidney disease: Secondary | ICD-10-CM | POA: Diagnosis not present

## 2015-01-09 ENCOUNTER — Ambulatory Visit (INDEPENDENT_AMBULATORY_CARE_PROVIDER_SITE_OTHER): Payer: Medicare Other

## 2015-01-09 VITALS — BP 110/58 | HR 70

## 2015-01-09 DIAGNOSIS — L603 Nail dystrophy: Secondary | ICD-10-CM

## 2015-01-09 DIAGNOSIS — E1140 Type 2 diabetes mellitus with diabetic neuropathy, unspecified: Secondary | ICD-10-CM

## 2015-01-09 DIAGNOSIS — Q828 Other specified congenital malformations of skin: Secondary | ICD-10-CM

## 2015-01-09 NOTE — Progress Notes (Signed)
   Subjective:    Patient ID: [name], male    DOB: 03-18-1941, 74 y.o.   MRN: [id number]  HPI I AM HERE TO GET MY TOENAILS AND CALLUSES TRIMMED UP AND ALSO TO GET NEW SHOES    Review of Systems no new findings or systemic changes noted     Objective:   Physical Exam Neurovascular status unchanged pedal pulses palpable DP and PT plus one over 4 bilateral Refill timed 3-4 seconds all digits there is keratoses sub-1 and 4 bilateral with hemorrhage a keratoses no open wounds no ulcers no secondary infection there is some dry scaling fissured skin [place] distribution of both feet nails thick brittle Crumley friable dystrophic and incurvated 1 through 5 bilateral no open wounds no ulcers no secondary infections flexible digital contractures are identified. On debridement keratoses sub-first right history with lumicain and Neosporin and Band-Aid dressing       Assessment & Plan:  Assessment this time is diabetes history peripheral neuropathy and angiopathy. Dystrophic friable criptotic nails debrided 1 through 5 bilateral. Also debridement multiple keratoses sub-1 and for 4 bilateral return for 3 month follow-up for palliative nail care in the future as needed  Harriet Masson DPM

## 2015-01-12 DIAGNOSIS — Z89422 Acquired absence of other left toe(s): Secondary | ICD-10-CM | POA: Diagnosis not present

## 2015-01-12 DIAGNOSIS — E1165 Type 2 diabetes mellitus with hyperglycemia: Secondary | ICD-10-CM | POA: Diagnosis not present

## 2015-01-12 DIAGNOSIS — N183 Chronic kidney disease, stage 3 (moderate): Secondary | ICD-10-CM | POA: Diagnosis not present

## 2015-01-12 DIAGNOSIS — L03039 Cellulitis of unspecified toe: Secondary | ICD-10-CM | POA: Diagnosis not present

## 2015-01-12 DIAGNOSIS — E1159 Type 2 diabetes mellitus with other circulatory complications: Secondary | ICD-10-CM | POA: Diagnosis not present

## 2015-01-12 DIAGNOSIS — Z79899 Other long term (current) drug therapy: Secondary | ICD-10-CM | POA: Diagnosis not present

## 2015-01-12 DIAGNOSIS — E1122 Type 2 diabetes mellitus with diabetic chronic kidney disease: Secondary | ICD-10-CM | POA: Diagnosis not present

## 2015-01-20 ENCOUNTER — Ambulatory Visit (INDEPENDENT_AMBULATORY_CARE_PROVIDER_SITE_OTHER): Payer: Medicare Other

## 2015-01-20 DIAGNOSIS — L603 Nail dystrophy: Secondary | ICD-10-CM

## 2015-01-20 DIAGNOSIS — Q828 Other specified congenital malformations of skin: Secondary | ICD-10-CM

## 2015-01-20 DIAGNOSIS — E1140 Type 2 diabetes mellitus with diabetic neuropathy, unspecified: Secondary | ICD-10-CM

## 2015-01-20 DIAGNOSIS — M2040 Other hammer toe(s) (acquired), unspecified foot: Secondary | ICD-10-CM

## 2015-01-20 NOTE — Progress Notes (Signed)
   Subjective:    Patient ID: [name], male    DOB: 1941/08/09, 74 y.o.   MRN: [id number]  HPI I AM GETTING MEASURED FOR MY DIABETIC SHOES    Review of Systems no new changes or findings noted     Objective:   Physical Exam Neurovascular status unchanged pedal pulses palpable the diminished on the ET pulses absent hair growth there is keratoses and contracture history of previous keratoses and complications patient has absence of epicritic sensation as well as angiopathy issues as well. Measurements for diabetic extra depth shoes are carried out at this time and bio foam impressions for custom molded multi-density and laser carried out.       Assessment & Plan:  Diabetes history peripheral neuropathy and angiopathy digital contractures associated keratoses and pre-ulceration fit from diabetic extra-depth shoes as preventative course of action for development of open wounds ulcers and sores. Patient be followed again within one month for orthotic pickup and fitting when ready

## 2015-02-09 DIAGNOSIS — N183 Chronic kidney disease, stage 3 (moderate): Secondary | ICD-10-CM | POA: Diagnosis not present

## 2015-02-14 DIAGNOSIS — I1310 Hypertensive heart and chronic kidney disease without heart failure, with stage 1 through stage 4 chronic kidney disease, or unspecified chronic kidney disease: Secondary | ICD-10-CM | POA: Diagnosis not present

## 2015-02-14 DIAGNOSIS — N183 Chronic kidney disease, stage 3 (moderate): Secondary | ICD-10-CM | POA: Diagnosis not present

## 2015-02-14 DIAGNOSIS — E1122 Type 2 diabetes mellitus with diabetic chronic kidney disease: Secondary | ICD-10-CM | POA: Diagnosis not present

## 2015-03-27 DIAGNOSIS — E1165 Type 2 diabetes mellitus with hyperglycemia: Secondary | ICD-10-CM | POA: Diagnosis not present

## 2015-03-27 DIAGNOSIS — Z0000 Encounter for general adult medical examination without abnormal findings: Secondary | ICD-10-CM | POA: Diagnosis not present

## 2015-03-27 DIAGNOSIS — E559 Vitamin D deficiency, unspecified: Secondary | ICD-10-CM | POA: Diagnosis not present

## 2015-03-27 DIAGNOSIS — N183 Chronic kidney disease, stage 3 (moderate): Secondary | ICD-10-CM | POA: Diagnosis not present

## 2015-03-27 DIAGNOSIS — E785 Hyperlipidemia, unspecified: Secondary | ICD-10-CM | POA: Diagnosis not present

## 2015-03-27 DIAGNOSIS — E1151 Type 2 diabetes mellitus with diabetic peripheral angiopathy without gangrene: Secondary | ICD-10-CM | POA: Diagnosis not present

## 2015-03-27 DIAGNOSIS — Z79899 Other long term (current) drug therapy: Secondary | ICD-10-CM | POA: Diagnosis not present

## 2015-03-27 DIAGNOSIS — E1122 Type 2 diabetes mellitus with diabetic chronic kidney disease: Secondary | ICD-10-CM | POA: Diagnosis not present

## 2015-03-27 DIAGNOSIS — I1310 Hypertensive heart and chronic kidney disease without heart failure, with stage 1 through stage 4 chronic kidney disease, or unspecified chronic kidney disease: Secondary | ICD-10-CM | POA: Diagnosis not present

## 2015-03-27 DIAGNOSIS — Z136 Encounter for screening for cardiovascular disorders: Secondary | ICD-10-CM | POA: Diagnosis not present

## 2015-03-30 ENCOUNTER — Encounter: Payer: Self-pay | Admitting: Podiatrist

## 2015-03-30 ENCOUNTER — Ambulatory Visit (INDEPENDENT_AMBULATORY_CARE_PROVIDER_SITE_OTHER): Payer: Medicare Other | Admitting: Podiatrist

## 2015-03-30 VITALS — BP 95/57 | HR 68 | Resp 18

## 2015-03-30 DIAGNOSIS — L84 Corns and callosities: Secondary | ICD-10-CM

## 2015-03-30 DIAGNOSIS — M2041 Other hammer toe(s) (acquired), right foot: Secondary | ICD-10-CM

## 2015-03-30 DIAGNOSIS — M2042 Other hammer toe(s) (acquired), left foot: Secondary | ICD-10-CM | POA: Diagnosis not present

## 2015-03-30 DIAGNOSIS — Q828 Other specified congenital malformations of skin: Secondary | ICD-10-CM

## 2015-03-30 DIAGNOSIS — E1140 Type 2 diabetes mellitus with diabetic neuropathy, unspecified: Secondary | ICD-10-CM | POA: Diagnosis not present

## 2015-03-30 DIAGNOSIS — M2040 Other hammer toe(s) (acquired), unspecified foot: Secondary | ICD-10-CM

## 2015-04-04 NOTE — Progress Notes (Signed)
Patient presents today to pick up diabetic shoes.  Relates the shoes and inserts feel good and they are free of defect.  No other concerns present at today's visit  Shoes are dispensed and patient given instructions on wear and use of the diabetic inserts.   We will reappoint for continued care and recheck of the shoes in 2-3 months.  If any questions, concerns or problems arise, patient to call.   

## 2015-04-10 ENCOUNTER — Ambulatory Visit: Payer: Medicare Other

## 2015-05-02 DIAGNOSIS — M6281 Muscle weakness (generalized): Secondary | ICD-10-CM | POA: Diagnosis not present

## 2015-05-23 DIAGNOSIS — Z1211 Encounter for screening for malignant neoplasm of colon: Secondary | ICD-10-CM | POA: Diagnosis not present

## 2015-06-05 DIAGNOSIS — R195 Other fecal abnormalities: Secondary | ICD-10-CM | POA: Diagnosis not present

## 2015-06-05 DIAGNOSIS — Z86010 Personal history of colonic polyps: Secondary | ICD-10-CM | POA: Diagnosis not present

## 2015-06-27 DIAGNOSIS — E119 Type 2 diabetes mellitus without complications: Secondary | ICD-10-CM | POA: Diagnosis not present

## 2015-06-27 DIAGNOSIS — I10 Essential (primary) hypertension: Secondary | ICD-10-CM | POA: Diagnosis not present

## 2015-06-27 DIAGNOSIS — K5730 Diverticulosis of large intestine without perforation or abscess without bleeding: Secondary | ICD-10-CM | POA: Diagnosis not present

## 2015-06-27 DIAGNOSIS — I252 Old myocardial infarction: Secondary | ICD-10-CM | POA: Diagnosis not present

## 2015-06-27 DIAGNOSIS — Z9049 Acquired absence of other specified parts of digestive tract: Secondary | ICD-10-CM | POA: Diagnosis not present

## 2015-06-27 DIAGNOSIS — Z951 Presence of aortocoronary bypass graft: Secondary | ICD-10-CM | POA: Diagnosis not present

## 2015-06-27 DIAGNOSIS — R195 Other fecal abnormalities: Secondary | ICD-10-CM | POA: Diagnosis not present

## 2015-06-27 DIAGNOSIS — K648 Other hemorrhoids: Secondary | ICD-10-CM | POA: Diagnosis not present

## 2015-06-27 DIAGNOSIS — K921 Melena: Secondary | ICD-10-CM | POA: Diagnosis not present

## 2015-06-27 DIAGNOSIS — M1990 Unspecified osteoarthritis, unspecified site: Secondary | ICD-10-CM | POA: Diagnosis not present

## 2015-06-27 DIAGNOSIS — Z87891 Personal history of nicotine dependence: Secondary | ICD-10-CM | POA: Diagnosis not present

## 2015-06-27 DIAGNOSIS — Z86010 Personal history of colonic polyps: Secondary | ICD-10-CM | POA: Diagnosis not present

## 2015-07-04 DIAGNOSIS — E785 Hyperlipidemia, unspecified: Secondary | ICD-10-CM | POA: Diagnosis not present

## 2015-07-04 DIAGNOSIS — Z79899 Other long term (current) drug therapy: Secondary | ICD-10-CM | POA: Diagnosis not present

## 2015-07-04 DIAGNOSIS — E1165 Type 2 diabetes mellitus with hyperglycemia: Secondary | ICD-10-CM | POA: Diagnosis not present

## 2015-07-04 DIAGNOSIS — N183 Chronic kidney disease, stage 3 (moderate): Secondary | ICD-10-CM | POA: Diagnosis not present

## 2015-07-04 DIAGNOSIS — E559 Vitamin D deficiency, unspecified: Secondary | ICD-10-CM | POA: Diagnosis not present

## 2015-07-04 DIAGNOSIS — I1310 Hypertensive heart and chronic kidney disease without heart failure, with stage 1 through stage 4 chronic kidney disease, or unspecified chronic kidney disease: Secondary | ICD-10-CM | POA: Diagnosis not present

## 2015-07-04 DIAGNOSIS — E1122 Type 2 diabetes mellitus with diabetic chronic kidney disease: Secondary | ICD-10-CM | POA: Diagnosis not present

## 2015-07-04 DIAGNOSIS — E1151 Type 2 diabetes mellitus with diabetic peripheral angiopathy without gangrene: Secondary | ICD-10-CM | POA: Diagnosis not present

## 2015-07-12 DIAGNOSIS — E559 Vitamin D deficiency, unspecified: Secondary | ICD-10-CM | POA: Diagnosis not present

## 2015-07-12 DIAGNOSIS — E785 Hyperlipidemia, unspecified: Secondary | ICD-10-CM | POA: Diagnosis not present

## 2015-07-12 DIAGNOSIS — Z79899 Other long term (current) drug therapy: Secondary | ICD-10-CM | POA: Diagnosis not present

## 2015-07-12 DIAGNOSIS — E1122 Type 2 diabetes mellitus with diabetic chronic kidney disease: Secondary | ICD-10-CM | POA: Diagnosis not present

## 2015-07-12 DIAGNOSIS — E1151 Type 2 diabetes mellitus with diabetic peripheral angiopathy without gangrene: Secondary | ICD-10-CM | POA: Diagnosis not present

## 2015-07-12 DIAGNOSIS — Z89412 Acquired absence of left great toe: Secondary | ICD-10-CM | POA: Diagnosis not present

## 2015-07-12 DIAGNOSIS — I2510 Atherosclerotic heart disease of native coronary artery without angina pectoris: Secondary | ICD-10-CM | POA: Diagnosis not present

## 2015-07-12 DIAGNOSIS — E1165 Type 2 diabetes mellitus with hyperglycemia: Secondary | ICD-10-CM | POA: Diagnosis not present

## 2015-07-12 DIAGNOSIS — N183 Chronic kidney disease, stage 3 (moderate): Secondary | ICD-10-CM | POA: Diagnosis not present

## 2015-07-12 DIAGNOSIS — I1310 Hypertensive heart and chronic kidney disease without heart failure, with stage 1 through stage 4 chronic kidney disease, or unspecified chronic kidney disease: Secondary | ICD-10-CM | POA: Diagnosis not present

## 2015-07-27 DIAGNOSIS — K5730 Diverticulosis of large intestine without perforation or abscess without bleeding: Secondary | ICD-10-CM | POA: Diagnosis not present

## 2015-07-27 DIAGNOSIS — A048 Other specified bacterial intestinal infections: Secondary | ICD-10-CM | POA: Diagnosis not present

## 2015-08-09 DIAGNOSIS — E1122 Type 2 diabetes mellitus with diabetic chronic kidney disease: Secondary | ICD-10-CM | POA: Diagnosis not present

## 2015-08-09 DIAGNOSIS — Z23 Encounter for immunization: Secondary | ICD-10-CM | POA: Diagnosis not present

## 2015-08-09 DIAGNOSIS — N183 Chronic kidney disease, stage 3 (moderate): Secondary | ICD-10-CM | POA: Diagnosis not present

## 2015-08-09 DIAGNOSIS — E1165 Type 2 diabetes mellitus with hyperglycemia: Secondary | ICD-10-CM | POA: Diagnosis not present

## 2015-08-09 DIAGNOSIS — E1151 Type 2 diabetes mellitus with diabetic peripheral angiopathy without gangrene: Secondary | ICD-10-CM | POA: Diagnosis not present

## 2015-08-15 DIAGNOSIS — N183 Chronic kidney disease, stage 3 (moderate): Secondary | ICD-10-CM | POA: Diagnosis not present

## 2015-08-16 DIAGNOSIS — E1122 Type 2 diabetes mellitus with diabetic chronic kidney disease: Secondary | ICD-10-CM | POA: Diagnosis not present

## 2015-08-16 DIAGNOSIS — N183 Chronic kidney disease, stage 3 (moderate): Secondary | ICD-10-CM | POA: Diagnosis not present

## 2015-08-16 DIAGNOSIS — I1310 Hypertensive heart and chronic kidney disease without heart failure, with stage 1 through stage 4 chronic kidney disease, or unspecified chronic kidney disease: Secondary | ICD-10-CM | POA: Diagnosis not present

## 2015-09-19 DIAGNOSIS — E1122 Type 2 diabetes mellitus with diabetic chronic kidney disease: Secondary | ICD-10-CM | POA: Diagnosis not present

## 2015-09-19 DIAGNOSIS — E785 Hyperlipidemia, unspecified: Secondary | ICD-10-CM | POA: Diagnosis not present

## 2015-09-19 DIAGNOSIS — E1165 Type 2 diabetes mellitus with hyperglycemia: Secondary | ICD-10-CM | POA: Diagnosis not present

## 2015-09-19 DIAGNOSIS — E559 Vitamin D deficiency, unspecified: Secondary | ICD-10-CM | POA: Diagnosis not present

## 2015-09-19 DIAGNOSIS — N183 Chronic kidney disease, stage 3 (moderate): Secondary | ICD-10-CM | POA: Diagnosis not present

## 2015-09-19 DIAGNOSIS — Z79899 Other long term (current) drug therapy: Secondary | ICD-10-CM | POA: Diagnosis not present

## 2015-09-19 DIAGNOSIS — I1310 Hypertensive heart and chronic kidney disease without heart failure, with stage 1 through stage 4 chronic kidney disease, or unspecified chronic kidney disease: Secondary | ICD-10-CM | POA: Diagnosis not present

## 2015-09-19 DIAGNOSIS — E1151 Type 2 diabetes mellitus with diabetic peripheral angiopathy without gangrene: Secondary | ICD-10-CM | POA: Diagnosis not present

## 2015-09-25 DIAGNOSIS — E785 Hyperlipidemia, unspecified: Secondary | ICD-10-CM | POA: Insufficient documentation

## 2015-09-25 DIAGNOSIS — I2510 Atherosclerotic heart disease of native coronary artery without angina pectoris: Secondary | ICD-10-CM | POA: Diagnosis not present

## 2015-09-25 DIAGNOSIS — N289 Disorder of kidney and ureter, unspecified: Secondary | ICD-10-CM | POA: Diagnosis not present

## 2015-09-25 DIAGNOSIS — I10 Essential (primary) hypertension: Secondary | ICD-10-CM | POA: Diagnosis not present

## 2015-09-25 DIAGNOSIS — E782 Mixed hyperlipidemia: Secondary | ICD-10-CM | POA: Diagnosis not present

## 2015-10-06 ENCOUNTER — Encounter: Payer: Self-pay | Admitting: Family

## 2015-10-09 DIAGNOSIS — E119 Type 2 diabetes mellitus without complications: Secondary | ICD-10-CM | POA: Diagnosis not present

## 2015-10-12 ENCOUNTER — Other Ambulatory Visit (HOSPITAL_COMMUNITY): Payer: Medicare Other

## 2015-10-12 ENCOUNTER — Encounter (HOSPITAL_COMMUNITY): Payer: Medicare Other

## 2015-10-12 ENCOUNTER — Ambulatory Visit: Payer: Medicare Other | Admitting: Family

## 2015-12-11 ENCOUNTER — Ambulatory Visit: Payer: Medicare Other | Admitting: Podiatry

## 2015-12-14 ENCOUNTER — Ambulatory Visit (INDEPENDENT_AMBULATORY_CARE_PROVIDER_SITE_OTHER): Payer: Medicare Other | Admitting: Sports Medicine

## 2015-12-14 ENCOUNTER — Encounter: Payer: Self-pay | Admitting: Sports Medicine

## 2015-12-14 DIAGNOSIS — M79673 Pain in unspecified foot: Secondary | ICD-10-CM

## 2015-12-14 DIAGNOSIS — I2510 Atherosclerotic heart disease of native coronary artery without angina pectoris: Secondary | ICD-10-CM | POA: Insufficient documentation

## 2015-12-14 DIAGNOSIS — E1140 Type 2 diabetes mellitus with diabetic neuropathy, unspecified: Secondary | ICD-10-CM

## 2015-12-14 DIAGNOSIS — I10 Essential (primary) hypertension: Secondary | ICD-10-CM | POA: Insufficient documentation

## 2015-12-14 DIAGNOSIS — E785 Hyperlipidemia, unspecified: Secondary | ICD-10-CM | POA: Insufficient documentation

## 2015-12-14 DIAGNOSIS — I739 Peripheral vascular disease, unspecified: Secondary | ICD-10-CM

## 2015-12-14 DIAGNOSIS — B351 Tinea unguium: Secondary | ICD-10-CM | POA: Diagnosis not present

## 2015-12-14 DIAGNOSIS — L84 Corns and callosities: Secondary | ICD-10-CM

## 2015-12-14 DIAGNOSIS — Q828 Other specified congenital malformations of skin: Secondary | ICD-10-CM

## 2015-12-14 NOTE — Progress Notes (Signed)
Patient ID: [name], male   DOB: 09-05-1941, 75 y.o.   MRN: [id number] Subjective: [name] is a 75 y.o. male patient with history of type 2 diabetes who presents to office today complaining of callus and long, painful nails  while ambulating in shoes; unable to trim. Patient states that the glucose reading this morning was 130 mg/dl. Diagnosed with DM 20 years ago. Admits to history of left leg stents and big toe amputation 8 years ago; no issues since. Patient denies any new changes in medication or new problems. Patient denies any new cramping, numbness, burning or tingling in the legs.  Patient Active Problem List   Diagnosis Date Noted  . Arteriosclerosis of coronary artery 12/14/2015  . Benign essential HTN 12/14/2015  . HLD (hyperlipidemia) 12/14/2015  . Dyslipidemia 09/25/2015  . Impaired renal function 09/25/2015  . Aftercare following surgery of the circulatory system, [place] 10/15/2013  . Peripheral vascular disease, unspecified ([place]) 09/02/2012  . Occlusion and stenosis of carotid artery without mention of cerebral infarction 09/02/2012  . Diabetes mellitus 06/13/2011   Current Outpatient Prescriptions on File Prior to Visit  Medication Sig Dispense Refill  . aspirin EC 325 MG tablet Take 325 mg by mouth daily.    Marland Kitchen aspirin EC 325 MG tablet Take 325 mg by mouth.    . carvedilol (COREG) 12.5 MG tablet Take 12.5 mg by mouth 2 (two) times daily with a meal.    . CIPROFLOXACIN HCL OP Apply to eye.      . ezetimibe (ZETIA) 10 MG tablet Take 10 mg by mouth daily.    Marland Kitchen ezetimibe (ZETIA) 10 MG tablet Take 10 mg by mouth.    . fenofibrate 160 MG tablet     . fenofibrate micronized (ANTARA) 130 MG capsule Take 130 mg by mouth daily before breakfast.      . GLYBURIDE PO Take by mouth.      . glyBURIDE-metformin (GLUCOVANCE) 5-500 MG per tablet Take 1 tablet by mouth daily with breakfast.    . Liraglutide (VICTOZA [place]) Inject 1.2 Units into the skin daily.    Marland Kitchen lisinopril  (PRINIVIL,ZESTRIL) 20 MG tablet Take 40 mg by mouth daily.     Marland Kitchen lisinopril (PRINIVIL,ZESTRIL) 40 MG tablet     . METFORMIN HCL PO Take by mouth.      . OXYCODONE HCL PO Take by mouth.      . rosuvastatin (CRESTOR) 10 MG tablet Take 20 mg by mouth.    . rosuvastatin (CRESTOR) 40 MG tablet Take 40 mg by mouth daily.    Marland Kitchen VICTOZA 18 MG/3ML SOPN     . warfarin (COUMADIN) 10 MG tablet Take 10 mg by mouth daily.       No current facility-administered medications on file prior to visit.   No Known Allergies   Objective: General: Patient is awake, alert, and oriented x 3 and in no acute distress.  Integument: Skin is warm, dry and supple bilateral. Nails are tender, long, thickened and  dystrophic with subungual debris, consistent with onychomycosis, 1-5 on right and 2-5 on left. No signs of infection. No open lesions, +  preulcerative lesions present bilateral sub 5 and sub 2 on left. Remaining integument unremarkable.  Vasculature:  Dorsalis Pedis pulse 2/4 bilateral. Posterior Tibial pulse  1/4 bilateral.  Capillary fill time 4 sec. Scant hair growth to the level of the digits. Temperature gradient within normal limits. + varicosities present bilateral. No edema present bilateral.   Neurology: The  patient has intact sensation measured with a 5.07/10g Semmes Weinstein Monofilament at all pedal sites bilateral . Vibratory sensation absent on left to level of ankle and diminished on right with tuning fork. No Babinski sign present bilateral.   Musculoskeletal: Bunion and hammertoe on right, Status post amputation of Left great toe, Muscular strength 5/5 in all lower extremity muscular groups bilateral without pain on range of motion . No tenderness with calf compression bilateral.  Assessment and Plan: Problem List Items Addressed This Visit      Cardiovascular and Mediastinum   Peripheral vascular disease, unspecified ([place])    Other Visit Diagnoses    Dermatophytosis of nail    -   Primary    Pre-ulcerative calluses        Type 2 diabetes, controlled, with neuropathy ([place])          -Examined patient. -Discussed and educated patient on diabetic foot care, especially with  regards to the vascular, neurological and musculoskeletal systems.  -Stressed the importance of good glycemic control and the detriment of not  controlling glucose levels in relation to the foot. -Mechanically debrided pre-ulcerative callus x 3 using sterile chisel blade and all nails x9 bilateral using sterile nail nipper and filed with dremel without incident  -[employer] step diabetic shoe order form was completed; office to contact primary care for approval / certification;  Office to arrange shoe fitting and dispensing. -Answered all patient questions -Patient to return in 3 months for at risk foot care -Patient advised to call the office if any problems or questions arise in the meantime.  [name], DPM

## 2015-12-15 ENCOUNTER — Other Ambulatory Visit (HOSPITAL_COMMUNITY): Payer: Medicare Other

## 2015-12-15 ENCOUNTER — Encounter (HOSPITAL_COMMUNITY): Payer: Medicare Other

## 2015-12-15 ENCOUNTER — Ambulatory Visit: Payer: Medicare Other | Admitting: Family

## 2015-12-28 ENCOUNTER — Encounter: Payer: Self-pay | Admitting: Family

## 2016-01-02 DIAGNOSIS — N182 Chronic kidney disease, stage 2 (mild): Secondary | ICD-10-CM | POA: Diagnosis not present

## 2016-01-02 DIAGNOSIS — E1122 Type 2 diabetes mellitus with diabetic chronic kidney disease: Secondary | ICD-10-CM | POA: Diagnosis not present

## 2016-01-03 ENCOUNTER — Telehealth: Payer: Self-pay | Admitting: Vascular Surgery

## 2016-01-03 NOTE — Telephone Encounter (Signed)
Pt called in at 2:20 pm 03.01.2017; he retrieve a reminder a today. He states he called yesterday to cancel and r/s tomorrow's appointment. Please call pt at [phone].

## 2016-01-04 ENCOUNTER — Encounter (HOSPITAL_COMMUNITY): Payer: Medicare Other

## 2016-01-04 ENCOUNTER — Inpatient Hospital Stay (HOSPITAL_COMMUNITY): Admission: RE | Admit: 2016-01-04 | Payer: Medicare Other | Source: Ambulatory Visit

## 2016-01-04 ENCOUNTER — Ambulatory Visit: Payer: Medicare Other | Admitting: Family

## 2016-01-04 NOTE — Telephone Encounter (Signed)
Spoke with patients wife. He still does not have his current schedule. She will have him call back this afternoon or tomorrow to r.s

## 2016-01-25 DIAGNOSIS — M25472 Effusion, left ankle: Secondary | ICD-10-CM | POA: Diagnosis not present

## 2016-01-25 DIAGNOSIS — R300 Dysuria: Secondary | ICD-10-CM | POA: Diagnosis not present

## 2016-02-01 DIAGNOSIS — H6123 Impacted cerumen, bilateral: Secondary | ICD-10-CM | POA: Diagnosis not present

## 2016-02-01 DIAGNOSIS — R8290 Unspecified abnormal findings in urine: Secondary | ICD-10-CM | POA: Diagnosis not present

## 2016-02-01 DIAGNOSIS — R300 Dysuria: Secondary | ICD-10-CM | POA: Diagnosis not present

## 2016-02-19 DIAGNOSIS — N183 Chronic kidney disease, stage 3 (moderate): Secondary | ICD-10-CM | POA: Diagnosis not present

## 2016-02-22 DIAGNOSIS — H6123 Impacted cerumen, bilateral: Secondary | ICD-10-CM | POA: Diagnosis not present

## 2016-02-22 DIAGNOSIS — H9193 Unspecified hearing loss, bilateral: Secondary | ICD-10-CM | POA: Diagnosis not present

## 2016-03-04 DIAGNOSIS — R300 Dysuria: Secondary | ICD-10-CM | POA: Diagnosis not present

## 2016-03-04 DIAGNOSIS — N401 Enlarged prostate with lower urinary tract symptoms: Secondary | ICD-10-CM | POA: Diagnosis not present

## 2016-03-04 DIAGNOSIS — H9193 Unspecified hearing loss, bilateral: Secondary | ICD-10-CM | POA: Diagnosis not present

## 2016-03-04 DIAGNOSIS — M25472 Effusion, left ankle: Secondary | ICD-10-CM | POA: Diagnosis not present

## 2016-03-04 DIAGNOSIS — D649 Anemia, unspecified: Secondary | ICD-10-CM | POA: Diagnosis not present

## 2016-03-04 DIAGNOSIS — H9313 Tinnitus, bilateral: Secondary | ICD-10-CM | POA: Diagnosis not present

## 2016-03-06 DIAGNOSIS — R310 Gross hematuria: Secondary | ICD-10-CM | POA: Diagnosis not present

## 2016-03-06 DIAGNOSIS — R300 Dysuria: Secondary | ICD-10-CM | POA: Diagnosis not present

## 2016-03-06 DIAGNOSIS — N3001 Acute cystitis with hematuria: Secondary | ICD-10-CM | POA: Diagnosis not present

## 2016-03-06 DIAGNOSIS — R351 Nocturia: Secondary | ICD-10-CM | POA: Diagnosis not present

## 2016-03-07 ENCOUNTER — Other Ambulatory Visit: Payer: Self-pay | Admitting: *Deleted

## 2016-03-07 DIAGNOSIS — I739 Peripheral vascular disease, unspecified: Secondary | ICD-10-CM

## 2016-03-07 DIAGNOSIS — Z48812 Encounter for surgical aftercare following surgery on the circulatory system: Secondary | ICD-10-CM

## 2016-03-07 DIAGNOSIS — I6523 Occlusion and stenosis of bilateral carotid arteries: Secondary | ICD-10-CM

## 2016-03-08 ENCOUNTER — Encounter: Payer: Self-pay | Admitting: Family

## 2016-03-11 DIAGNOSIS — I2510 Atherosclerotic heart disease of native coronary artery without angina pectoris: Secondary | ICD-10-CM | POA: Diagnosis not present

## 2016-03-11 DIAGNOSIS — I714 Abdominal aortic aneurysm, without rupture: Secondary | ICD-10-CM | POA: Diagnosis not present

## 2016-03-11 DIAGNOSIS — K8020 Calculus of gallbladder without cholecystitis without obstruction: Secondary | ICD-10-CM | POA: Diagnosis not present

## 2016-03-11 DIAGNOSIS — R938 Abnormal findings on diagnostic imaging of other specified body structures: Secondary | ICD-10-CM | POA: Diagnosis not present

## 2016-03-12 DIAGNOSIS — R310 Gross hematuria: Secondary | ICD-10-CM | POA: Diagnosis not present

## 2016-03-12 DIAGNOSIS — R300 Dysuria: Secondary | ICD-10-CM | POA: Diagnosis not present

## 2016-03-12 DIAGNOSIS — R351 Nocturia: Secondary | ICD-10-CM | POA: Diagnosis not present

## 2016-03-12 DIAGNOSIS — N3021 Other chronic cystitis with hematuria: Secondary | ICD-10-CM | POA: Diagnosis not present

## 2016-03-13 DIAGNOSIS — I129 Hypertensive chronic kidney disease with stage 1 through stage 4 chronic kidney disease, or unspecified chronic kidney disease: Secondary | ICD-10-CM | POA: Insufficient documentation

## 2016-03-13 DIAGNOSIS — E1122 Type 2 diabetes mellitus with diabetic chronic kidney disease: Secondary | ICD-10-CM | POA: Diagnosis not present

## 2016-03-13 DIAGNOSIS — N183 Chronic kidney disease, stage 3 (moderate): Secondary | ICD-10-CM

## 2016-03-15 ENCOUNTER — Ambulatory Visit (HOSPITAL_COMMUNITY)
Admission: RE | Admit: 2016-03-15 | Discharge: 2016-03-15 | Disposition: A | Discharge status: Home / Self Care | Payer: Medicare Other | Source: Ambulatory Visit | Attending: Family | Admitting: Family

## 2016-03-15 ENCOUNTER — Ambulatory Visit (INDEPENDENT_AMBULATORY_CARE_PROVIDER_SITE_OTHER)
Admission: RE | Admit: 2016-03-15 | Discharge: 2016-03-15 | Disposition: A | Discharge status: Home / Self Care | Payer: Medicare Other | Source: Ambulatory Visit | Attending: Family | Admitting: Family

## 2016-03-15 ENCOUNTER — Encounter: Payer: Self-pay | Admitting: Family

## 2016-03-15 ENCOUNTER — Ambulatory Visit (INDEPENDENT_AMBULATORY_CARE_PROVIDER_SITE_OTHER): Payer: Medicare Other | Admitting: Family

## 2016-03-15 VITALS — BP 158/77 | HR 78 | Temp 97.4°F | Ht 68.0 in | Wt 202.6 lb

## 2016-03-15 DIAGNOSIS — R0989 Other specified symptoms and signs involving the circulatory and respiratory systems: Secondary | ICD-10-CM | POA: Diagnosis present

## 2016-03-15 DIAGNOSIS — E1151 Type 2 diabetes mellitus with diabetic peripheral angiopathy without gangrene: Secondary | ICD-10-CM

## 2016-03-15 DIAGNOSIS — I2510 Atherosclerotic heart disease of native coronary artery without angina pectoris: Secondary | ICD-10-CM | POA: Diagnosis not present

## 2016-03-15 DIAGNOSIS — I779 Disorder of arteries and arterioles, unspecified: Secondary | ICD-10-CM | POA: Diagnosis not present

## 2016-03-15 DIAGNOSIS — Z87891 Personal history of nicotine dependence: Secondary | ICD-10-CM

## 2016-03-15 DIAGNOSIS — I739 Peripheral vascular disease, unspecified: Secondary | ICD-10-CM | POA: Diagnosis not present

## 2016-03-15 DIAGNOSIS — Z48812 Encounter for surgical aftercare following surgery on the circulatory system: Secondary | ICD-10-CM | POA: Diagnosis not present

## 2016-03-15 DIAGNOSIS — Z4889 Encounter for other specified surgical aftercare: Secondary | ICD-10-CM

## 2016-03-15 DIAGNOSIS — I6522 Occlusion and stenosis of left carotid artery: Secondary | ICD-10-CM

## 2016-03-15 DIAGNOSIS — Z9889 Other specified postprocedural states: Secondary | ICD-10-CM

## 2016-03-15 DIAGNOSIS — E785 Hyperlipidemia, unspecified: Secondary | ICD-10-CM | POA: Insufficient documentation

## 2016-03-15 DIAGNOSIS — I6523 Occlusion and stenosis of bilateral carotid arteries: Secondary | ICD-10-CM | POA: Insufficient documentation

## 2016-03-15 DIAGNOSIS — E119 Type 2 diabetes mellitus without complications: Secondary | ICD-10-CM | POA: Insufficient documentation

## 2016-03-15 DIAGNOSIS — Z95828 Presence of other vascular implants and grafts: Secondary | ICD-10-CM

## 2016-03-15 DIAGNOSIS — I10 Essential (primary) hypertension: Secondary | ICD-10-CM | POA: Insufficient documentation

## 2016-03-15 NOTE — Progress Notes (Signed)
[facility] [place] HISTORY AND PHYSICAL   MRN : [id number]  History of Present Illness:   ESAUL Barajas is a 75 y.o. male who is status post left great toe amputation by Dr. Oneida Alar with left mid superficial femoral artery to dorsalis pedis artery bypass graft in 2006, left superficial femoral artery endarterectomy in 2009. The patient also had a left CEA in [employer].  He returns today for carotid artery and PAD surveillance.  Pt reports a mild tired feeling in both calves after walking about 1.5 miles, relieved by rest, uses elliptical exercise machine daily.  He denies non healing wounds.  The patient denies a history of TIA or stroke symptoms. Specifically he denies a history of amaurosis fugax or monocular blindness, unilateral facial drooping, hemiparesis, or receptive or expressive aphasia.  He denies tingling, numbness, cold sensation, or weakness in either hand/arm. States his blood pressure at home is about 135/65, states he has "white coat syndrome".  He will have a cystoscope next week for a "blockage" related to his left kidney; this will be performed in [place].   Pt Diabetic: Yes, states in good control, he cannot recall his last A1C, self reports FBS as 120-160; pt states he does not know why the Victoza was stopped by his PCP as Victoza seemed to help his blood sugar better and he had no nausea. Pt smoker: former smoker, quit about 1994  Pt meds include: Statin :Yes Betablocker: Yes ASA: Yes Other anticoagulants/antiplatelets: no     Current Outpatient Prescriptions  Medication Sig Dispense Refill  . aspirin EC 325 MG tablet Take 325 mg by mouth daily.    . carvedilol (COREG) 12.5 MG tablet Take 12.5 mg by mouth 2 (two) times daily with a meal.    . ezetimibe (ZETIA) 10 MG tablet Take 10 mg by mouth daily.    . fenofibrate micronized (ANTARA) 130 MG capsule Take 130 mg by mouth daily before breakfast.      . lisinopril  (PRINIVIL,ZESTRIL) 40 MG tablet     . rosuvastatin (CRESTOR) 40 MG tablet Take 20 mg by mouth daily.      No current facility-administered medications for this visit.    Past Medical History  Diagnosis Date  . Diabetes mellitus Age 58  . Hyperlipidemia   . Hypertension   . Peripheral vascular disease ([place])   . Carotid artery occlusion   . Joint pain   . Ulcer   . Arthritis   . CAD (coronary artery disease)     Social History Social History  Substance Use Topics  . Smoking status: Former Smoker    Quit date: 11/04/1978  . Smokeless tobacco: Never Used  . Alcohol Use: No    Family History Family History  Problem Relation Age of Onset  . Heart disease Father     Heart Disease before age 4  . Heart attack Father   . Cancer Mother     Bladder    Surgical History Past Surgical History  Procedure Laterality Date  . Foot surgery    . Amputation      left first toe  . Pr vein bypass graft,aorto-fem-pop  07/2005    left femoral to dorsalis pedis artery bypass  . Appendectomy    . Carotid endarterectomy Left 2000    CE    No Known Allergies  Current Outpatient Prescriptions  Medication Sig Dispense Refill  . aspirin EC 325 MG tablet Take 325 mg by mouth daily.    Marland Kitchen  carvedilol (COREG) 12.5 MG tablet Take 12.5 mg by mouth 2 (two) times daily with a meal.    . ezetimibe (ZETIA) 10 MG tablet Take 10 mg by mouth daily.    . fenofibrate micronized (ANTARA) 130 MG capsule Take 130 mg by mouth daily before breakfast.      . lisinopril (PRINIVIL,ZESTRIL) 40 MG tablet     . rosuvastatin (CRESTOR) 40 MG tablet Take 20 mg by mouth daily.      No current facility-administered medications for this visit.     REVIEW OF SYSTEMS: See HPI for pertinent positives and negatives.  Physical Examination Filed Vitals:   03/15/16 1137  BP: 158/77  Pulse: 78  Temp: 97.4 F (36.3 C)  TempSrc: Oral  Height: 5\' 8"  (1.727 m)  Weight: 202 lb 9.6 oz (91.899 kg)  SpO2: 92%   Body  mass index is 30.81 kg/(m^2).  General: WDWN obese male in NAD Gait: Normal HENT: WNL Eyes: Pupils equal Pulmonary: normal non-labored breathing, CTAB Cardiac: RRR with occasional dropped contractions, no detectedmurmur  Abdomen: soft, NT, no masses palpated Skin: no rashes, no ulcers, no cellulitis.   VASCULAR EXAM  Carotid Bruits Left Right   Negative Negative  Aorta is not palpable radial pulses are 2+ palpable and =    Extremities without ischemic changes  without Gangrene; without open wounds.     LE Pulses LEFT RIGHT   FEMORAL 2+ palpable 1+ palpable    POPLITEAL not palpable  not palpable   POSTERIOR TIBIAL not palpable  not palpable    DORSALIS PEDIS  ANTERIOR TIBIAL 3+ palpable  1+ palpable    PERONEAL not Palpable   not Palpable     Musculoskeletal: no muscle wasting or atrophy; no peripheral edema, left great toe surgically absent. Neurologic: A&O X 3; Appropriate Affect ;  SENSATION: normal; MOTOR FUNCTION: 5/5 Symmetric, CN 2-12 intact Speech is fluent/normal          Non-Invasive Vascular Imaging (03/15/2016):  Carotid Duplex: Right ICA with <40% stenosis. Left ICA: CEA site with no evidence of restenosis or hyperplasia No significant change compared to 10/06/14 exam.   Left LE Arterial Duplex: Patent left LE bypass graft with no evidence of restenosis.  Homogenous plaque in the native inflow segment with minimal extension into the proximal anastomosis.  ABI's are stable on the left leg and slightly lower on the right leg compared to exam of 10/06/14   ABI:  R: 0.82 (0.94, 10/06/14), DP: biphasic (monophasic), PT: monophasic (monophasic), TBI: 0.55 (0.51)  L: 1.05 (1.10), DP: triphasic  (biphasic), PT: biphasic (monophasic), TBI: great toe surgically absent   ASSESSMENT:  [name] is a 75 y.o. male who is status post left great toe amputation by Dr. Oneida Alar with left mid superficial femoral artery to dorsalis pedis artery bypass graft in 2006, left superficial femoral artery endarterectomy in 2009. The patient also had a left CEA in [employer].  He has mild bilateral calf claudication symptoms, has good exercise capacity, no signs of ischemia in his feet/legs. He has no history of stroke or TIA. Today's carotid Duplex reveals  <40% right ICA stenosis. Left ICA: CEA site with no evidence of restenosis or hyperplasia No significant change compared to 10/06/14 exam.  Today's left LE arterial duplex suggests no evidence of restenosis in the bypass graft.  Homogenous plaque in the native inflow segment with minimal extension into the proximal anastomosis.  ABI's are stable on the left leg and slightly lower on the  right leg compared to exam of 10/06/14.   PLAN:   Continue daily exercising.  Based on today's exam and non-invasive vascular lab results, the patient will follow up in 1 year with the following tests: carotid duplex, ABI, and left LE arterial duplex. I advised pt to notify us if he develops concerns re the circulation in his legs.  I discussed in depth with the patient the nature of atherosclerosis, and emphasized the importance of maximal medical management including strict control of blood pressure, blood glucose, and lipid levels, obtaining regular exercise, and cessation of smoking.  The patient is aware that without maximal medical management the underlying atherosclerotic disease process will progress, limiting the benefit of any interventions.  The patient was given information about stroke prevention and what symptoms should prompt the patient to seek immediate medical care.  The patient was given information about PAD including signs, symptoms,  treatment, what symptoms should prompt the patient to seek immediate medical care, and risk reduction measures to take. Thank you for allowing [place] to participate in this patient's care.  Clemon Chambers, RN, MSN, FNP-C [facility] Office: [phone]  Clinic MD: Bridgett Larsson 03/15/2016 11:49 AM

## 2016-03-15 NOTE — Patient Instructions (Signed)
Stroke Prevention Some medical conditions and behaviors are associated with an increased chance of having a stroke. You may prevent a stroke by making healthy choices and managing medical conditions. HOW CAN I REDUCE MY RISK OF HAVING A STROKE?   Stay physically active. Get at least 30 minutes of activity on most or all days.  Do not smoke. It may also be helpful to avoid exposure to secondhand smoke.  Limit alcohol use. Moderate alcohol use is considered to be:  No more than 2 drinks per day for men.  No more than 1 drink per day for nonpregnant women.  Eat healthy foods. This involves:  Eating 5 or more servings of fruits and vegetables a day.  Making dietary changes that address high blood pressure (hypertension), high cholesterol, diabetes, or obesity.  Manage your cholesterol levels.  Making food choices that are high in fiber and low in saturated fat, trans fat, and cholesterol may control cholesterol levels.  Take any prescribed medicines to control cholesterol as directed by your health care provider.  Manage your diabetes.  Controlling your carbohydrate and sugar intake is recommended to manage diabetes.  Take any prescribed medicines to control diabetes as directed by your health care provider.  Control your hypertension.  Making food choices that are low in salt (sodium), saturated fat, trans fat, and cholesterol is recommended to manage hypertension.  Ask your health care provider if you need treatment to lower your blood pressure. Take any prescribed medicines to control hypertension as directed by your health care provider.  If you are 18-39 years of age, have your blood pressure checked every 3-5 years. If you are 40 years of age or older, have your blood pressure checked every year.  Maintain a healthy weight.  Reducing calorie intake and making food choices that are low in sodium, saturated fat, trans fat, and cholesterol are recommended to manage  weight.  Stop drug abuse.  Avoid taking birth control pills.  Talk to your health care provider about the risks of taking birth control pills if you are over 35 years old, smoke, get migraines, or have ever had a blood clot.  Get evaluated for sleep disorders (sleep apnea).  Talk to your health care provider about getting a sleep evaluation if you snore a lot or have excessive sleepiness.  Take medicines only as directed by your health care provider.  For some people, aspirin or blood thinners (anticoagulants) are helpful in reducing the risk of forming abnormal blood clots that can lead to stroke. If you have the irregular heart rhythm of atrial fibrillation, you should be on a blood thinner unless there is a good reason you cannot take them.  Understand all your medicine instructions.  Make sure that other conditions (such as anemia or atherosclerosis) are addressed. SEEK IMMEDIATE MEDICAL CARE IF:   You have sudden weakness or numbness of the face, arm, or leg, especially on one side of the body.  Your face or eyelid droops to one side.  You have sudden confusion.  You have trouble speaking (aphasia) or understanding.  You have sudden trouble seeing in one or both eyes.  You have sudden trouble walking.  You have dizziness.  You have a loss of balance or coordination.  You have a sudden, severe headache with no known cause.  You have new chest pain or an irregular heartbeat. Any of these symptoms may represent a serious problem that is an emergency. Do not wait to see if the symptoms will   go away. Get medical help at once. Call your local emergency services (911 in [place] Do not drive yourself to the hospital.   This information is not intended to replace advice given to you by your health care provider. Make sure you discuss any questions you have with your health care provider.   Document Released: 11/28/2004 Document Revised: 11/11/2014 Document Reviewed:  04/23/2013 Elsevier Interactive Patient Education 2016 Elsevier Inc.    Peripheral Vascular Disease Peripheral vascular disease (PVD) is a disease of the blood vessels that are not part of your heart and brain. A simple term for PVD is poor circulation. In most cases, PVD narrows the blood vessels that carry blood from your heart to the rest of your body. This can result in a decreased supply of blood to your arms, legs, and internal organs, like your stomach or kidneys. However, it most often affects a person's lower legs and feet. There are two types of PVD.  Organic PVD. This is the more common type. It is caused by damage to the structure of blood vessels.  Functional PVD. This is caused by conditions that make blood vessels contract and tighten (spasm). Without treatment, PVD tends to get worse over time. PVD can also lead to acute ischemic limb. This is when an arm or limb suddenly has trouble getting enough blood. This is a medical emergency. CAUSES Each type of PVD has many different causes. The most common cause of PVD is buildup of a fatty material (plaque) inside of your arteries (atherosclerosis). Small amounts of plaque can break off from the walls of the blood vessels and become lodged in a smaller artery. This blocks blood flow and can cause acute ischemic limb. Other common causes of PVD include:  Blood clots that form inside of blood vessels.  Injuries to blood vessels.  Diseases that cause inflammation of blood vessels or cause blood vessel spasms.  Health behaviors and health history that increase your risk of developing PVD. RISK FACTORS  You may have a greater risk of PVD if you:  Have a family history of PVD.  Have certain medical conditions, including:  High cholesterol.  Diabetes.  High blood pressure (hypertension).  Coronary heart disease.  Past problems with blood clots.  Past injury, such as burns or a broken bone. These may have damaged blood  vessels in your limbs.  Buerger disease. This is caused by inflamed blood vessels in your hands and feet.  Some forms of arthritis.  Rare birth defects that affect the arteries in your legs.  Use tobacco.  Do not get enough exercise.  Are obese.  Are age 50 or older. SIGNS AND SYMPTOMS  PVD may cause many different symptoms. Your symptoms depend on what part of your body is not getting enough blood. Some common signs and symptoms include:  Cramps in your lower legs. This may be a symptom of poor leg circulation (claudication).  Pain and weakness in your legs while you are physically active that goes away when you rest (intermittent claudication).  Leg pain when at rest.  Leg numbness, tingling, or weakness.  Coldness in a leg or foot, especially when compared with the other leg.  Skin or hair changes. These can include:  Hair loss.  Shiny skin.  Pale or bluish skin.  Thick toenails.  Inability to get or maintain an erection (erectile dysfunction). People with PVD are more prone to developing ulcers and sores on their toes, feet, or legs. These may take longer than   normal to heal. DIAGNOSIS Your health care provider may diagnose PVD from your signs and symptoms. The health care provider will also do a physical exam. You may have tests to find out what is causing your PVD and determine its severity. Tests may include:  Blood pressure recordings from your arms and legs and measurements of the strength of your pulses (pulse volume recordings).  Imaging studies using sound waves to take pictures of the blood flow through your blood vessels (Doppler ultrasound).  Injecting a dye into your blood vessels before having imaging studies using:  X-rays (angiogram or arteriogram).  Computer-generated X-rays (CT angiogram).  A powerful electromagnetic field and a computer (magnetic resonance angiogram or MRA). TREATMENT Treatment for PVD depends on the cause of your condition  and the severity of your symptoms. It also depends on your age. Underlying causes need to be treated and controlled. These include long-lasting (chronic) conditions, such as diabetes, high cholesterol, and high blood pressure. You may need to first try making lifestyle changes and taking medicines. Surgery may be needed if these do not work. Lifestyle changes may include:  Quitting smoking.  Exercising regularly.  Following a low-fat, low-cholesterol diet. Medicines may include:  Blood thinners to prevent blood clots.  Medicines to improve blood flow.  Medicines to improve your blood cholesterol levels. Surgical procedures may include:  A procedure that uses an inflated balloon to open a blocked artery and improve blood flow (angioplasty).  A procedure to put in a tube (stent) to keep a blocked artery open (stent implant).  Surgery to reroute blood flow around a blocked artery (peripheral bypass surgery).  Surgery to remove dead tissue from an infected wound on the affected limb.  Amputation. This is surgical removal of the affected limb. This may be necessary in cases of acute ischemic limb that are not improved through medical or surgical treatments. HOME CARE INSTRUCTIONS  Take medicines only as directed by your health care provider.  Do not use any tobacco products, including cigarettes, chewing tobacco, or electronic cigarettes. If you need help quitting, ask your health care provider.  Lose weight if you are overweight, and maintain a healthy weight as directed by your health care provider.  Eat a diet that is low in fat and cholesterol. If you need help, ask your health care provider.  Exercise regularly. Ask your health care provider to suggest some good activities for you.  Use compression stockings or other mechanical devices as directed by your health care provider.  Take good care of your feet.  Wear comfortable shoes that fit well.  Check your feet often for  any cuts or sores. SEEK MEDICAL CARE IF:  You have cramps in your legs while walking.  You have leg pain when you are at rest.  You have coldness in a leg or foot.  Your skin changes.  You have erectile dysfunction.  You have cuts or sores on your feet that are not healing. SEEK IMMEDIATE MEDICAL CARE IF:  Your arm or leg turns cold and blue.  Your arms or legs become red, warm, swollen, painful, or numb.  You have chest pain or trouble breathing.  You suddenly have weakness in your face, arm, or leg.  You become very confused or lose the ability to speak.  You suddenly have a very bad headache or lose your vision.   This information is not intended to replace advice given to you by your health care provider. Make sure you discuss any questions   you have with your health care provider.   Document Released: 11/28/2004 Document Revised: 11/11/2014 Document Reviewed: 03/31/2014 Elsevier Interactive Patient Education 2016 Elsevier Inc.  

## 2016-03-19 DIAGNOSIS — I10 Essential (primary) hypertension: Secondary | ICD-10-CM | POA: Diagnosis not present

## 2016-03-19 DIAGNOSIS — Z01812 Encounter for preprocedural laboratory examination: Secondary | ICD-10-CM | POA: Diagnosis not present

## 2016-03-19 DIAGNOSIS — Z01810 Encounter for preprocedural cardiovascular examination: Secondary | ICD-10-CM | POA: Diagnosis not present

## 2016-03-20 DIAGNOSIS — Z79899 Other long term (current) drug therapy: Secondary | ICD-10-CM | POA: Diagnosis not present

## 2016-03-20 DIAGNOSIS — N289 Disorder of kidney and ureter, unspecified: Secondary | ICD-10-CM | POA: Diagnosis not present

## 2016-03-20 DIAGNOSIS — I252 Old myocardial infarction: Secondary | ICD-10-CM | POA: Diagnosis not present

## 2016-03-20 DIAGNOSIS — G4730 Sleep apnea, unspecified: Secondary | ICD-10-CM | POA: Diagnosis not present

## 2016-03-20 DIAGNOSIS — I10 Essential (primary) hypertension: Secondary | ICD-10-CM | POA: Diagnosis not present

## 2016-03-20 DIAGNOSIS — N135 Crossing vessel and stricture of ureter without hydronephrosis: Secondary | ICD-10-CM | POA: Diagnosis not present

## 2016-03-20 DIAGNOSIS — Z951 Presence of aortocoronary bypass graft: Secondary | ICD-10-CM | POA: Diagnosis not present

## 2016-03-20 DIAGNOSIS — E1140 Type 2 diabetes mellitus with diabetic neuropathy, unspecified: Secondary | ICD-10-CM | POA: Diagnosis not present

## 2016-03-20 DIAGNOSIS — Z87891 Personal history of nicotine dependence: Secondary | ICD-10-CM | POA: Diagnosis not present

## 2016-03-20 DIAGNOSIS — R310 Gross hematuria: Secondary | ICD-10-CM | POA: Diagnosis not present

## 2016-03-20 DIAGNOSIS — Z7984 Long term (current) use of oral hypoglycemic drugs: Secondary | ICD-10-CM | POA: Diagnosis not present

## 2016-03-20 DIAGNOSIS — I2510 Atherosclerotic heart disease of native coronary artery without angina pectoris: Secondary | ICD-10-CM | POA: Diagnosis not present

## 2016-03-20 DIAGNOSIS — N2889 Other specified disorders of kidney and ureter: Secondary | ICD-10-CM | POA: Diagnosis not present

## 2016-03-20 DIAGNOSIS — E785 Hyperlipidemia, unspecified: Secondary | ICD-10-CM | POA: Diagnosis not present

## 2016-03-25 DIAGNOSIS — R310 Gross hematuria: Secondary | ICD-10-CM | POA: Diagnosis not present

## 2016-03-25 DIAGNOSIS — N3090 Cystitis, unspecified without hematuria: Secondary | ICD-10-CM | POA: Diagnosis not present

## 2016-03-25 DIAGNOSIS — R351 Nocturia: Secondary | ICD-10-CM | POA: Diagnosis not present

## 2016-03-25 DIAGNOSIS — N3001 Acute cystitis with hematuria: Secondary | ICD-10-CM | POA: Diagnosis not present

## 2016-03-25 DIAGNOSIS — R300 Dysuria: Secondary | ICD-10-CM | POA: Diagnosis not present

## 2016-03-27 DIAGNOSIS — N183 Chronic kidney disease, stage 3 (moderate): Secondary | ICD-10-CM | POA: Diagnosis not present

## 2016-03-27 DIAGNOSIS — Z1159 Encounter for screening for other viral diseases: Secondary | ICD-10-CM | POA: Diagnosis not present

## 2016-03-27 DIAGNOSIS — E785 Hyperlipidemia, unspecified: Secondary | ICD-10-CM | POA: Diagnosis not present

## 2016-03-27 DIAGNOSIS — E1151 Type 2 diabetes mellitus with diabetic peripheral angiopathy without gangrene: Secondary | ICD-10-CM | POA: Diagnosis not present

## 2016-03-27 DIAGNOSIS — E669 Obesity, unspecified: Secondary | ICD-10-CM | POA: Diagnosis not present

## 2016-03-27 DIAGNOSIS — Z6830 Body mass index (BMI) 30.0-30.9, adult: Secondary | ICD-10-CM | POA: Diagnosis not present

## 2016-03-27 DIAGNOSIS — Z136 Encounter for screening for cardiovascular disorders: Secondary | ICD-10-CM | POA: Diagnosis not present

## 2016-03-27 DIAGNOSIS — Z1211 Encounter for screening for malignant neoplasm of colon: Secondary | ICD-10-CM | POA: Diagnosis not present

## 2016-03-27 DIAGNOSIS — Z0000 Encounter for general adult medical examination without abnormal findings: Secondary | ICD-10-CM | POA: Diagnosis not present

## 2016-03-27 DIAGNOSIS — Z9189 Other specified personal risk factors, not elsewhere classified: Secondary | ICD-10-CM | POA: Diagnosis not present

## 2016-03-27 DIAGNOSIS — I1310 Hypertensive heart and chronic kidney disease without heart failure, with stage 1 through stage 4 chronic kidney disease, or unspecified chronic kidney disease: Secondary | ICD-10-CM | POA: Diagnosis not present

## 2016-03-27 DIAGNOSIS — Z9181 History of falling: Secondary | ICD-10-CM | POA: Diagnosis not present

## 2016-03-27 DIAGNOSIS — N401 Enlarged prostate with lower urinary tract symptoms: Secondary | ICD-10-CM | POA: Diagnosis not present

## 2016-03-27 DIAGNOSIS — E559 Vitamin D deficiency, unspecified: Secondary | ICD-10-CM | POA: Diagnosis not present

## 2016-03-27 DIAGNOSIS — E1165 Type 2 diabetes mellitus with hyperglycemia: Secondary | ICD-10-CM | POA: Diagnosis not present

## 2016-03-28 ENCOUNTER — Ambulatory Visit (INDEPENDENT_AMBULATORY_CARE_PROVIDER_SITE_OTHER): Payer: Medicare Other | Admitting: Sports Medicine

## 2016-03-28 ENCOUNTER — Encounter: Payer: Self-pay | Admitting: Sports Medicine

## 2016-03-28 DIAGNOSIS — L84 Corns and callosities: Secondary | ICD-10-CM | POA: Diagnosis not present

## 2016-03-28 DIAGNOSIS — E1151 Type 2 diabetes mellitus with diabetic peripheral angiopathy without gangrene: Secondary | ICD-10-CM | POA: Diagnosis present

## 2016-03-28 DIAGNOSIS — E785 Hyperlipidemia, unspecified: Secondary | ICD-10-CM | POA: Diagnosis present

## 2016-03-28 DIAGNOSIS — IMO0002 Reserved for concepts with insufficient information to code with codable children: Secondary | ICD-10-CM

## 2016-03-28 DIAGNOSIS — M79676 Pain in unspecified toe(s): Secondary | ICD-10-CM

## 2016-03-28 DIAGNOSIS — D631 Anemia in chronic kidney disease: Secondary | ICD-10-CM | POA: Diagnosis present

## 2016-03-28 DIAGNOSIS — E875 Hyperkalemia: Secondary | ICD-10-CM | POA: Diagnosis present

## 2016-03-28 DIAGNOSIS — N138 Other obstructive and reflux uropathy: Secondary | ICD-10-CM | POA: Diagnosis not present

## 2016-03-28 DIAGNOSIS — I129 Hypertensive chronic kidney disease with stage 1 through stage 4 chronic kidney disease, or unspecified chronic kidney disease: Secondary | ICD-10-CM | POA: Diagnosis present

## 2016-03-28 DIAGNOSIS — M2040 Other hammer toe(s) (acquired), unspecified foot: Secondary | ICD-10-CM

## 2016-03-28 DIAGNOSIS — Z79899 Other long term (current) drug therapy: Secondary | ICD-10-CM | POA: Diagnosis not present

## 2016-03-28 DIAGNOSIS — Z87891 Personal history of nicotine dependence: Secondary | ICD-10-CM | POA: Diagnosis not present

## 2016-03-28 DIAGNOSIS — N401 Enlarged prostate with lower urinary tract symptoms: Secondary | ICD-10-CM | POA: Diagnosis not present

## 2016-03-28 DIAGNOSIS — E1140 Type 2 diabetes mellitus with diabetic neuropathy, unspecified: Secondary | ICD-10-CM

## 2016-03-28 DIAGNOSIS — N179 Acute kidney failure, unspecified: Secondary | ICD-10-CM | POA: Diagnosis not present

## 2016-03-28 DIAGNOSIS — Z7982 Long term (current) use of aspirin: Secondary | ICD-10-CM | POA: Diagnosis not present

## 2016-03-28 DIAGNOSIS — I2510 Atherosclerotic heart disease of native coronary artery without angina pectoris: Secondary | ICD-10-CM | POA: Diagnosis present

## 2016-03-28 DIAGNOSIS — E1121 Type 2 diabetes mellitus with diabetic nephropathy: Secondary | ICD-10-CM | POA: Diagnosis present

## 2016-03-28 DIAGNOSIS — E119 Type 2 diabetes mellitus without complications: Secondary | ICD-10-CM | POA: Diagnosis not present

## 2016-03-28 DIAGNOSIS — I120 Hypertensive chronic kidney disease with stage 5 chronic kidney disease or end stage renal disease: Secondary | ICD-10-CM | POA: Diagnosis not present

## 2016-03-28 DIAGNOSIS — R319 Hematuria, unspecified: Secondary | ICD-10-CM | POA: Insufficient documentation

## 2016-03-28 DIAGNOSIS — E1122 Type 2 diabetes mellitus with diabetic chronic kidney disease: Secondary | ICD-10-CM | POA: Diagnosis present

## 2016-03-28 DIAGNOSIS — T39395D Adverse effect of other nonsteroidal anti-inflammatory drugs [NSAID], subsequent encounter: Secondary | ICD-10-CM | POA: Diagnosis not present

## 2016-03-28 DIAGNOSIS — B351 Tinea unguium: Secondary | ICD-10-CM | POA: Diagnosis not present

## 2016-03-28 DIAGNOSIS — I10 Essential (primary) hypertension: Secondary | ICD-10-CM | POA: Diagnosis not present

## 2016-03-28 DIAGNOSIS — Z89422 Acquired absence of other left toe(s): Secondary | ICD-10-CM

## 2016-03-28 DIAGNOSIS — N289 Disorder of kidney and ureter, unspecified: Secondary | ICD-10-CM | POA: Diagnosis not present

## 2016-03-28 DIAGNOSIS — N390 Urinary tract infection, site not specified: Secondary | ICD-10-CM | POA: Diagnosis not present

## 2016-03-28 DIAGNOSIS — N183 Chronic kidney disease, stage 3 (moderate): Secondary | ICD-10-CM | POA: Diagnosis present

## 2016-03-28 DIAGNOSIS — I739 Peripheral vascular disease, unspecified: Secondary | ICD-10-CM

## 2016-03-28 DIAGNOSIS — N170 Acute kidney failure with tubular necrosis: Secondary | ICD-10-CM | POA: Diagnosis present

## 2016-03-28 NOTE — Progress Notes (Signed)
Patient ID: DONG ULLAND, male   DOB: 05/05/1941, 75 y.o.   MRN: [id number]  Subjective: [name] is a 75 y.o. male patient with history of type 2 diabetes who presents to office today complaining of callus and long, painful nails  while ambulating in shoes; unable to trim. Patient states that the glucose reading this morning was "good". Admits that he is going for a scan on his kidneys. Patient denies any other new changes in medication or new problems. Patient denies any new cramping, numbness, burning or tingling in the legs.  Patient Active Problem List   Diagnosis Date Noted  . Benign hypertension 03/13/2016  . Type 2 diabetes mellitus ([place]) 03/13/2016  . Arteriosclerosis of coronary artery 12/14/2015  . Benign essential HTN 12/14/2015  . HLD (hyperlipidemia) 12/14/2015  . Dyslipidemia 09/25/2015  . Impaired renal function 09/25/2015  . Aftercare following surgery of the circulatory system, [place] 10/15/2013  . Peripheral vascular disease, unspecified ([place]) 09/02/2012  . Occlusion and stenosis of carotid artery without mention of cerebral infarction 09/02/2012  . Diabetes mellitus 06/13/2011   Current Outpatient Prescriptions on File Prior to Visit  Medication Sig Dispense Refill  . aspirin EC 325 MG tablet Take 325 mg by mouth daily.    . carvedilol (COREG) 12.5 MG tablet Take 12.5 mg by mouth 2 (two) times daily with a meal.    . ezetimibe (ZETIA) 10 MG tablet Take 10 mg by mouth daily.    . fenofibrate micronized (ANTARA) 130 MG capsule Take 130 mg by mouth daily before breakfast.      . lisinopril (PRINIVIL,ZESTRIL) 40 MG tablet     . rosuvastatin (CRESTOR) 40 MG tablet Take 20 mg by mouth daily.      No current facility-administered medications on file prior to visit.   No Known Allergies   Objective: General: Patient is awake, alert, and oriented x 3 and in no acute distress.  Integument: Skin is warm, dry and supple bilateral. Nails are tender, long, thickened and   dystrophic with subungual debris, consistent with onychomycosis, 1-5 on right and 2-5 on left. No signs of infection. No open lesions, +  preulcerative lesions present bilateral sub 5 and sub 2 on left and sub 4 on right. Remaining integument unremarkable.  Vasculature:  Dorsalis Pedis pulse 2/4 bilateral. Posterior Tibial pulse  1/4 bilateral.  Capillary fill time 4 sec. Scant hair growth to the level of the digits. Temperature gradient within normal limits. + varicosities present bilateral. No edema present bilateral.   Neurology: The patient has intact sensation measured with a 5.07/10g Semmes Weinstein Monofilament at all pedal sites bilateral . Vibratory sensation absent on left to level of ankle and diminished on right with tuning fork. No Babinski sign present bilateral.   Musculoskeletal: Bunion and hammertoe on right, Status post amputation of Left great toe, Muscular strength 5/5 in all lower extremity muscular groups bilateral without pain on range of motion . No tenderness with calf compression bilateral.  Assessment and Plan: Problem List Items Addressed This Visit      Cardiovascular and Mediastinum   Peripheral vascular disease, unspecified ([place])    Other Visit Diagnoses    Dermatophytosis of nail    -  Primary    Pre-ulcerative calluses        Type 2 diabetes, controlled, with neuropathy ([place])        Hammertoe, unspecified laterality        Toe amputation status, left ([place])  great toe      -Examined patient. -Discussed and educated patient on diabetic foot care, especially with  regards to the vascular, neurological and musculoskeletal systems.  -Stressed the importance of good glycemic control and the detriment of not  controlling glucose levels in relation to the foot. -Mechanically debrided pre-ulcerative callus x 3 using sterile chisel blade and all nails x9 bilateral using sterile nail nipper and filed with dremel without incident  -[employer] diabetic shoe  order form was completed; patient provided completed primary care for approval / certification forms today in office;  Office to arrange shoe fitting and dispensing. -Answered all patient questions -Patient to return in 3 months for at risk foot care -Patient advised to call the office if any problems or questions arise in the meantime.  [name], DPM

## 2016-03-31 DIAGNOSIS — Z1211 Encounter for screening for malignant neoplasm of colon: Secondary | ICD-10-CM | POA: Diagnosis not present

## 2016-04-17 DIAGNOSIS — C679 Malignant neoplasm of bladder, unspecified: Secondary | ICD-10-CM | POA: Diagnosis not present

## 2016-04-17 DIAGNOSIS — N179 Acute kidney failure, unspecified: Secondary | ICD-10-CM | POA: Diagnosis not present

## 2016-04-17 DIAGNOSIS — R319 Hematuria, unspecified: Secondary | ICD-10-CM | POA: Diagnosis not present

## 2016-04-19 DIAGNOSIS — N179 Acute kidney failure, unspecified: Secondary | ICD-10-CM | POA: Diagnosis not present

## 2016-05-08 DIAGNOSIS — N183 Chronic kidney disease, stage 3 (moderate): Secondary | ICD-10-CM | POA: Diagnosis not present

## 2016-05-09 DIAGNOSIS — E1122 Type 2 diabetes mellitus with diabetic chronic kidney disease: Secondary | ICD-10-CM | POA: Diagnosis not present

## 2016-05-09 DIAGNOSIS — I129 Hypertensive chronic kidney disease with stage 1 through stage 4 chronic kidney disease, or unspecified chronic kidney disease: Secondary | ICD-10-CM | POA: Diagnosis not present

## 2016-05-09 DIAGNOSIS — N179 Acute kidney failure, unspecified: Secondary | ICD-10-CM | POA: Diagnosis not present

## 2016-05-09 DIAGNOSIS — N183 Chronic kidney disease, stage 3 (moderate): Secondary | ICD-10-CM | POA: Diagnosis not present

## 2016-05-24 DIAGNOSIS — I10 Essential (primary) hypertension: Secondary | ICD-10-CM | POA: Diagnosis not present

## 2016-05-24 DIAGNOSIS — E785 Hyperlipidemia, unspecified: Secondary | ICD-10-CM | POA: Diagnosis not present

## 2016-05-24 DIAGNOSIS — E088 Diabetes mellitus due to underlying condition with unspecified complications: Secondary | ICD-10-CM | POA: Diagnosis not present

## 2016-05-24 DIAGNOSIS — I2510 Atherosclerotic heart disease of native coronary artery without angina pectoris: Secondary | ICD-10-CM | POA: Diagnosis not present

## 2016-05-29 DIAGNOSIS — H903 Sensorineural hearing loss, bilateral: Secondary | ICD-10-CM | POA: Diagnosis not present

## 2016-05-29 DIAGNOSIS — H9313 Tinnitus, bilateral: Secondary | ICD-10-CM | POA: Diagnosis not present

## 2016-05-29 DIAGNOSIS — I10 Essential (primary) hypertension: Secondary | ICD-10-CM | POA: Diagnosis not present

## 2016-06-04 ENCOUNTER — Other Ambulatory Visit: Payer: Self-pay | Admitting: *Deleted

## 2016-06-04 DIAGNOSIS — I739 Peripheral vascular disease, unspecified: Secondary | ICD-10-CM

## 2016-06-04 DIAGNOSIS — I6523 Occlusion and stenosis of bilateral carotid arteries: Secondary | ICD-10-CM

## 2016-06-13 ENCOUNTER — Other Ambulatory Visit: Payer: Self-pay | Admitting: *Deleted

## 2016-06-13 DIAGNOSIS — I739 Peripheral vascular disease, unspecified: Secondary | ICD-10-CM

## 2016-06-14 DIAGNOSIS — B1920 Unspecified viral hepatitis C without hepatic coma: Secondary | ICD-10-CM | POA: Diagnosis not present

## 2016-06-27 ENCOUNTER — Encounter (INDEPENDENT_AMBULATORY_CARE_PROVIDER_SITE_OTHER): Payer: Self-pay

## 2016-06-27 ENCOUNTER — Encounter: Payer: Self-pay | Admitting: Sports Medicine

## 2016-06-27 ENCOUNTER — Ambulatory Visit (INDEPENDENT_AMBULATORY_CARE_PROVIDER_SITE_OTHER): Payer: Medicare Other | Admitting: Sports Medicine

## 2016-06-27 DIAGNOSIS — E1140 Type 2 diabetes mellitus with diabetic neuropathy, unspecified: Secondary | ICD-10-CM

## 2016-06-27 DIAGNOSIS — M2040 Other hammer toe(s) (acquired), unspecified foot: Secondary | ICD-10-CM

## 2016-06-27 DIAGNOSIS — M79676 Pain in unspecified toe(s): Secondary | ICD-10-CM

## 2016-06-27 DIAGNOSIS — B351 Tinea unguium: Secondary | ICD-10-CM

## 2016-06-27 DIAGNOSIS — I739 Peripheral vascular disease, unspecified: Secondary | ICD-10-CM

## 2016-06-27 DIAGNOSIS — L84 Corns and callosities: Secondary | ICD-10-CM

## 2016-06-27 DIAGNOSIS — IMO0002 Reserved for concepts with insufficient information to code with codable children: Secondary | ICD-10-CM

## 2016-06-27 DIAGNOSIS — Z89422 Acquired absence of other left toe(s): Secondary | ICD-10-CM

## 2016-06-27 NOTE — Progress Notes (Signed)
Patient ID: BRAND ALEMANY, male   DOB: [birth year], 75 y.o.   MRN: [id number]  Subjective: ELIJHA HOERIG is a 75 y.o. male patient with history of type 2 diabetes who presents to office today complaining of callus and long, painful nails  while ambulating in shoes; unable to trim. Patient states that the glucose reading this morning was 135.  Patient denies any other new changes in medication or new problems. Patient denies any new cramping, numbness, burning or tingling in the legs.  Patient Active Problem List   Diagnosis Date Noted  . Benign hypertension 03/13/2016  . Type 2 diabetes mellitus ([place]) 03/13/2016  . Arteriosclerosis of coronary artery 12/14/2015  . Benign essential HTN 12/14/2015  . HLD (hyperlipidemia) 12/14/2015  . Dyslipidemia 09/25/2015  . Impaired renal function 09/25/2015  . Aftercare following surgery of the circulatory system, [place] 10/15/2013  . Peripheral vascular disease, unspecified ([place]) 09/02/2012  . Occlusion and stenosis of carotid artery without mention of cerebral infarction 09/02/2012  . Diabetes mellitus 06/13/2011   Current Outpatient Prescriptions on File Prior to Visit  Medication Sig Dispense Refill  . aspirin EC 325 MG tablet Take 325 mg by mouth daily.    . carvedilol (COREG) 12.5 MG tablet Take 12.5 mg by mouth 2 (two) times daily with a meal.    . ezetimibe (ZETIA) 10 MG tablet Take 10 mg by mouth daily.    . fenofibrate micronized (ANTARA) 130 MG capsule Take 130 mg by mouth daily before breakfast.      . lisinopril (PRINIVIL,ZESTRIL) 40 MG tablet     . rosuvastatin (CRESTOR) 40 MG tablet Take 20 mg by mouth daily.      No current facility-administered medications on file prior to visit.    No Known Allergies   Objective: General: Patient is awake, alert, and oriented x 3 and in no acute distress.  Integument: Skin is warm, dry and supple bilateral. Nails are tender, long, thickened and dystrophic with subungual debris, consistent with  onychomycosis, 1-5 on right and 2-5 on left. No signs of infection. No open lesions, +  preulcerative lesions present bilateral sub 5 and sub 2 on left and sub 4 on right. Remaining integument unremarkable.  Vasculature:  Dorsalis Pedis pulse 2/4 bilateral. Posterior Tibial pulse  1/4 bilateral. Capillary fill time 4 sec. Scant hair growth to the level of the digits.Temperature gradient within normal limits. + varicosities present bilateral. No edema present bilateral.   Neurology: The patient has intact sensation measured with a 5.07/10g Semmes Weinstein Monofilament at all pedal sites bilateral. Vibratory sensation absent on left to level of ankle and diminished on right with tuning fork. No Babinski sign present bilateral.   Musculoskeletal: Bunion and hammertoe on right, Status post amputation of Left great toe, Muscular strength 5/5 in all lower extremity muscular groups bilateral without pain on range of motion . No tenderness with calf compression bilateral.  Assessment and Plan: Problem List Items Addressed This Visit      Cardiovascular and Mediastinum   Peripheral vascular disease, unspecified ([place])    Other Visit Diagnoses    Dermatophytosis of nail    -  Primary   Pre-ulcerative calluses       Type 2 diabetes, controlled, with neuropathy ([place])       Hammertoe, unspecified laterality       Toe amputation status, left ([place])         -Examined patient. -Discussed and educated patient on diabetic foot care, especially with regards  to the vascular, neurological and musculoskeletal systems.  -Stressed the importance of good glycemic control and the detriment of not controlling glucose levels in relation to the foot. -Mechanically debrided pre-ulcerative callus x 3 using sterile chisel blade and all nails x9 bilateral using sterile nail nipper and filed with dremel without incident  -Awaiting diabetic shoes -Answered all patient questions -Patient to return in 3 months for at risk  foot care -Patient advised to call the office if any problems or questions arise in the meantime.  [name], DPM

## 2016-07-17 DIAGNOSIS — E1142 Type 2 diabetes mellitus with diabetic polyneuropathy: Secondary | ICD-10-CM | POA: Diagnosis not present

## 2016-07-17 DIAGNOSIS — N183 Chronic kidney disease, stage 3 (moderate): Secondary | ICD-10-CM | POA: Diagnosis not present

## 2016-07-17 DIAGNOSIS — L84 Corns and callosities: Secondary | ICD-10-CM | POA: Diagnosis not present

## 2016-07-17 DIAGNOSIS — M2042 Other hammer toe(s) (acquired), left foot: Secondary | ICD-10-CM | POA: Diagnosis not present

## 2016-07-17 DIAGNOSIS — E1122 Type 2 diabetes mellitus with diabetic chronic kidney disease: Secondary | ICD-10-CM | POA: Diagnosis not present

## 2016-07-17 DIAGNOSIS — Z89419 Acquired absence of unspecified great toe: Secondary | ICD-10-CM | POA: Diagnosis not present

## 2016-07-17 DIAGNOSIS — M2041 Other hammer toe(s) (acquired), right foot: Secondary | ICD-10-CM | POA: Diagnosis not present

## 2016-07-17 DIAGNOSIS — E119 Type 2 diabetes mellitus without complications: Secondary | ICD-10-CM | POA: Diagnosis not present

## 2016-07-29 DIAGNOSIS — I1310 Hypertensive heart and chronic kidney disease without heart failure, with stage 1 through stage 4 chronic kidney disease, or unspecified chronic kidney disease: Secondary | ICD-10-CM | POA: Diagnosis not present

## 2016-07-29 DIAGNOSIS — N401 Enlarged prostate with lower urinary tract symptoms: Secondary | ICD-10-CM | POA: Diagnosis not present

## 2016-07-29 DIAGNOSIS — N183 Chronic kidney disease, stage 3 (moderate): Secondary | ICD-10-CM | POA: Diagnosis not present

## 2016-07-29 DIAGNOSIS — F5101 Primary insomnia: Secondary | ICD-10-CM | POA: Diagnosis not present

## 2016-07-29 DIAGNOSIS — E785 Hyperlipidemia, unspecified: Secondary | ICD-10-CM | POA: Diagnosis not present

## 2016-07-29 DIAGNOSIS — Z87898 Personal history of other specified conditions: Secondary | ICD-10-CM | POA: Diagnosis not present

## 2016-07-29 DIAGNOSIS — E1122 Type 2 diabetes mellitus with diabetic chronic kidney disease: Secondary | ICD-10-CM | POA: Diagnosis not present

## 2016-07-29 DIAGNOSIS — E559 Vitamin D deficiency, unspecified: Secondary | ICD-10-CM | POA: Diagnosis not present

## 2016-07-29 DIAGNOSIS — Z23 Encounter for immunization: Secondary | ICD-10-CM | POA: Diagnosis not present

## 2016-08-14 ENCOUNTER — Ambulatory Visit (INDEPENDENT_AMBULATORY_CARE_PROVIDER_SITE_OTHER): Payer: Medicare Other | Admitting: Sports Medicine

## 2016-08-14 DIAGNOSIS — E1140 Type 2 diabetes mellitus with diabetic neuropathy, unspecified: Secondary | ICD-10-CM

## 2016-08-14 DIAGNOSIS — IMO0002 Reserved for concepts with insufficient information to code with codable children: Secondary | ICD-10-CM

## 2016-08-14 DIAGNOSIS — Z89422 Acquired absence of other left toe(s): Secondary | ICD-10-CM

## 2016-08-14 DIAGNOSIS — L84 Corns and callosities: Secondary | ICD-10-CM

## 2016-08-16 NOTE — Progress Notes (Signed)
Patient discussed with medical assistant, Benjie Karvonen. Patient was measured for Diabetic shoes and will be notified when time to return for shoe fitting and dispensing. -Dr. Cannon Kettle

## 2016-09-06 DIAGNOSIS — N183 Chronic kidney disease, stage 3 (moderate): Secondary | ICD-10-CM | POA: Diagnosis not present

## 2016-09-09 DIAGNOSIS — E1122 Type 2 diabetes mellitus with diabetic chronic kidney disease: Secondary | ICD-10-CM | POA: Diagnosis not present

## 2016-09-11 ENCOUNTER — Ambulatory Visit: Payer: Medicare Other | Admitting: Sports Medicine

## 2016-09-11 ENCOUNTER — Ambulatory Visit (INDEPENDENT_AMBULATORY_CARE_PROVIDER_SITE_OTHER): Payer: Medicare Other | Admitting: Sports Medicine

## 2016-09-11 ENCOUNTER — Encounter: Payer: Medicare Other | Admitting: Sports Medicine

## 2016-09-11 ENCOUNTER — Encounter: Payer: Self-pay | Admitting: Sports Medicine

## 2016-09-11 DIAGNOSIS — IMO0002 Reserved for concepts with insufficient information to code with codable children: Secondary | ICD-10-CM

## 2016-09-11 DIAGNOSIS — Z89422 Acquired absence of other left toe(s): Secondary | ICD-10-CM

## 2016-09-11 DIAGNOSIS — M79672 Pain in left foot: Secondary | ICD-10-CM | POA: Diagnosis not present

## 2016-09-11 DIAGNOSIS — B351 Tinea unguium: Secondary | ICD-10-CM

## 2016-09-11 DIAGNOSIS — L84 Corns and callosities: Secondary | ICD-10-CM

## 2016-09-11 DIAGNOSIS — Q828 Other specified congenital malformations of skin: Secondary | ICD-10-CM

## 2016-09-11 DIAGNOSIS — M79676 Pain in unspecified toe(s): Secondary | ICD-10-CM | POA: Diagnosis not present

## 2016-09-11 DIAGNOSIS — M79671 Pain in right foot: Secondary | ICD-10-CM

## 2016-09-11 DIAGNOSIS — E1140 Type 2 diabetes mellitus with diabetic neuropathy, unspecified: Secondary | ICD-10-CM

## 2016-09-11 NOTE — Progress Notes (Signed)
Duplicate encounter -Dr. Cannon Kettle

## 2016-09-11 NOTE — Progress Notes (Signed)
Patient ID: [name], male   DOB: Aug 11, 1941, 75 y.o.   MRN: [id number]  Subjective: [name] is a 75 y.o. male patient with history of type 2 diabetes who presents to office today complaining of callus and long, painful nails  while ambulating in shoes; unable to trim. Patient states that the glucose reading this morning was "good".  Patient denies any other new changes in medication or new problems. Patient denies any new cramping, numbness, burning or tingling in the legs.  Patient is also here to pick up his diabetic shoes; Reports that it has taken almost 1 year to get shoes.   Patient Active Problem List   Diagnosis Date Noted  . ATN (acute tubular necrosis) ([place]) 03/28/2016  . Hematuria 03/28/2016  . Hyperkalemia 03/28/2016  . Benign hypertension 03/13/2016  . Type 2 diabetes mellitus ([place]) 03/13/2016  . Arteriosclerosis of coronary artery 12/14/2015  . Benign essential HTN 12/14/2015  . HLD (hyperlipidemia) 12/14/2015  . Dyslipidemia 09/25/2015  . Impaired renal function 09/25/2015  . Aftercare following surgery of the circulatory system, [place] 10/15/2013  . Peripheral vascular disease, unspecified 09/02/2012  . Occlusion and stenosis of carotid artery without mention of cerebral infarction 09/02/2012  . DM (diabetes mellitus) ([place]) 06/13/2011   Current Outpatient Prescriptions on File Prior to Visit  Medication Sig Dispense Refill  . aspirin EC 325 MG tablet Take 325 mg by mouth daily.    . carvedilol (COREG) 12.5 MG tablet Take 12.5 mg by mouth 2 (two) times daily with a meal.    . ezetimibe (ZETIA) 10 MG tablet Take 10 mg by mouth daily.    . fenofibrate micronized (ANTARA) 130 MG capsule Take 130 mg by mouth daily before breakfast.      . lisinopril (PRINIVIL,ZESTRIL) 40 MG tablet     . rosuvastatin (CRESTOR) 40 MG tablet Take 20 mg by mouth daily.      No current facility-administered medications on file prior to visit.    No Known Allergies   Objective: General:  Patient is awake, alert, and oriented x 3 and in no acute distress.  Integument: Skin is warm, dry and supple bilateral. Nails are tender, long, thickened and dystrophic with subungual debris, consistent with onychomycosis, 1-5 on right and 2-5 on left. No signs of infection. No open lesions, +  preulcerative lesions present bilateral sub 5 and sub 2 on left and sub 4 on right. Remaining integument unremarkable.  Vasculature:  Dorsalis Pedis pulse 2/4 bilateral. Posterior Tibial pulse  1/4 bilateral. Capillary fill time 4 sec. Scant hair growth to the level of the digits.Temperature gradient within normal limits. + varicosities present bilateral. No edema present bilateral.   Neurology: The patient has intact sensation measured with a 5.07/10g Semmes Weinstein Monofilament at all pedal sites bilateral. Vibratory sensation absent on left to level of ankle and diminished on right with tuning fork. No Babinski sign present bilateral.   Musculoskeletal: Bunion and hammertoe on right, Status post amputation of Left great toe, Muscular strength 5/5 in all lower extremity muscular groups bilateral without pain on range of motion . No tenderness with calf compression bilateral.  Assessment and Plan: Problem List Items Addressed This Visit    None    Visit Diagnoses    Pre-ulcerative calluses    -  Primary   Dermatophytosis of nail       Toe amputation status, left (H[facility])       Type 2 diabetes, controlled, with neuropathy ([place])  Foot pain, bilateral         -Examined patient. -Discussed and educated patient on diabetic foot care, especially with regards to the vascular, neurological and musculoskeletal systems.  -Stressed the importance of good glycemic control and the detriment of not controlling glucose levels in relation to the foot. -Mechanically debrided pre-ulcerative callus x 3 using sterile chisel blade and all nails x9 bilateral using sterile nail nipper and filed with dremel without  incident  -Diabetic shoes dispensed by Benjie Karvonen with breakin/wear instructions -Answered all patient questions -Patient to return in 3 months for at risk foot care -Patient advised to call the office if any problems or questions arise in the meantime.  [name], DPM

## 2016-09-16 DIAGNOSIS — E669 Obesity, unspecified: Secondary | ICD-10-CM | POA: Diagnosis not present

## 2016-09-16 DIAGNOSIS — N183 Chronic kidney disease, stage 3 (moderate): Secondary | ICD-10-CM | POA: Diagnosis not present

## 2016-09-16 DIAGNOSIS — I129 Hypertensive chronic kidney disease with stage 1 through stage 4 chronic kidney disease, or unspecified chronic kidney disease: Secondary | ICD-10-CM | POA: Diagnosis not present

## 2016-09-16 DIAGNOSIS — E1122 Type 2 diabetes mellitus with diabetic chronic kidney disease: Secondary | ICD-10-CM | POA: Diagnosis not present

## 2016-10-02 ENCOUNTER — Ambulatory Visit: Payer: Medicare Other | Admitting: Sports Medicine

## 2016-10-30 DIAGNOSIS — Z79899 Other long term (current) drug therapy: Secondary | ICD-10-CM | POA: Diagnosis not present

## 2016-10-30 DIAGNOSIS — Z87898 Personal history of other specified conditions: Secondary | ICD-10-CM | POA: Diagnosis not present

## 2016-10-30 DIAGNOSIS — G4700 Insomnia, unspecified: Secondary | ICD-10-CM | POA: Diagnosis not present

## 2016-10-30 DIAGNOSIS — E559 Vitamin D deficiency, unspecified: Secondary | ICD-10-CM | POA: Diagnosis not present

## 2016-10-30 DIAGNOSIS — N401 Enlarged prostate with lower urinary tract symptoms: Secondary | ICD-10-CM | POA: Diagnosis not present

## 2016-10-30 DIAGNOSIS — E785 Hyperlipidemia, unspecified: Secondary | ICD-10-CM | POA: Diagnosis not present

## 2016-10-30 DIAGNOSIS — I1310 Hypertensive heart and chronic kidney disease without heart failure, with stage 1 through stage 4 chronic kidney disease, or unspecified chronic kidney disease: Secondary | ICD-10-CM | POA: Diagnosis not present

## 2016-10-30 DIAGNOSIS — N183 Chronic kidney disease, stage 3 (moderate): Secondary | ICD-10-CM | POA: Diagnosis not present

## 2016-10-30 DIAGNOSIS — E1122 Type 2 diabetes mellitus with diabetic chronic kidney disease: Secondary | ICD-10-CM | POA: Diagnosis not present

## 2016-11-14 DIAGNOSIS — Z0000 Encounter for general adult medical examination without abnormal findings: Secondary | ICD-10-CM | POA: Diagnosis not present

## 2016-11-14 DIAGNOSIS — Z136 Encounter for screening for cardiovascular disorders: Secondary | ICD-10-CM | POA: Diagnosis not present

## 2016-11-14 DIAGNOSIS — E785 Hyperlipidemia, unspecified: Secondary | ICD-10-CM | POA: Diagnosis not present

## 2016-11-14 DIAGNOSIS — E1122 Type 2 diabetes mellitus with diabetic chronic kidney disease: Secondary | ICD-10-CM | POA: Diagnosis not present

## 2016-11-14 DIAGNOSIS — Z6832 Body mass index (BMI) 32.0-32.9, adult: Secondary | ICD-10-CM | POA: Diagnosis not present

## 2016-11-14 DIAGNOSIS — I1310 Hypertensive heart and chronic kidney disease without heart failure, with stage 1 through stage 4 chronic kidney disease, or unspecified chronic kidney disease: Secondary | ICD-10-CM | POA: Diagnosis not present

## 2016-11-14 DIAGNOSIS — N183 Chronic kidney disease, stage 3 (moderate): Secondary | ICD-10-CM | POA: Diagnosis not present

## 2016-11-27 DIAGNOSIS — E119 Type 2 diabetes mellitus without complications: Secondary | ICD-10-CM | POA: Diagnosis not present

## 2016-12-11 ENCOUNTER — Encounter: Payer: Self-pay | Admitting: Sports Medicine

## 2016-12-11 ENCOUNTER — Ambulatory Visit (INDEPENDENT_AMBULATORY_CARE_PROVIDER_SITE_OTHER): Payer: Medicare Other | Admitting: Sports Medicine

## 2016-12-11 DIAGNOSIS — B351 Tinea unguium: Secondary | ICD-10-CM | POA: Diagnosis not present

## 2016-12-11 DIAGNOSIS — M79672 Pain in left foot: Secondary | ICD-10-CM | POA: Diagnosis not present

## 2016-12-11 DIAGNOSIS — L84 Corns and callosities: Secondary | ICD-10-CM

## 2016-12-11 DIAGNOSIS — E1140 Type 2 diabetes mellitus with diabetic neuropathy, unspecified: Secondary | ICD-10-CM

## 2016-12-11 DIAGNOSIS — Q828 Other specified congenital malformations of skin: Secondary | ICD-10-CM

## 2016-12-11 DIAGNOSIS — M79671 Pain in right foot: Secondary | ICD-10-CM

## 2016-12-11 DIAGNOSIS — Z89422 Acquired absence of other left toe(s): Secondary | ICD-10-CM

## 2016-12-11 DIAGNOSIS — IMO0002 Reserved for concepts with insufficient information to code with codable children: Secondary | ICD-10-CM

## 2016-12-11 NOTE — Progress Notes (Signed)
Patient ID: [name], male   DOB: 1941-07-21, 76 y.o.   MRN: [id number]  Subjective: [name] is a 76 y.o. male patient with history of type 2 diabetes who presents to office today complaining of callus and long, painful nails  while ambulating in shoes; unable to trim. Patient states that the glucose reading this morning was "good".  Patient denies any other new changes in medication or new problems. Patient denies any new cramping, numbness, burning or tingling in the legs.  Patient Active Problem List   Diagnosis Date Noted  . ATN (acute tubular necrosis) ([place]) 03/28/2016  . Hematuria 03/28/2016  . Hyperkalemia 03/28/2016  . Benign hypertension 03/13/2016  . Type 2 diabetes mellitus ([place]) 03/13/2016  . Arteriosclerosis of coronary artery 12/14/2015  . Benign essential HTN 12/14/2015  . HLD (hyperlipidemia) 12/14/2015  . Dyslipidemia 09/25/2015  . Impaired renal function 09/25/2015  . Aftercare following surgery of the circulatory system, [place] 10/15/2013  . Peripheral vascular disease, unspecified 09/02/2012  . Occlusion and stenosis of carotid artery without mention of cerebral infarction 09/02/2012  . DM (diabetes mellitus) ([place]) 06/13/2011   Current Outpatient Prescriptions on File Prior to Visit  Medication Sig Dispense Refill  . aspirin EC 325 MG tablet Take 325 mg by mouth daily.    . carvedilol (COREG) 12.5 MG tablet Take 12.5 mg by mouth 2 (two) times daily with a meal.    . ezetimibe (ZETIA) 10 MG tablet Take 10 mg by mouth daily.    . fenofibrate micronized (ANTARA) 130 MG capsule Take 130 mg by mouth daily before breakfast.      . lisinopril (PRINIVIL,ZESTRIL) 40 MG tablet     . rosuvastatin (CRESTOR) 40 MG tablet Take 20 mg by mouth daily.      No current facility-administered medications on file prior to visit.    No Known Allergies   Objective: General: Patient is awake, alert, and oriented x 3 and in no acute distress.  Integument: Skin is warm, dry and  supple bilateral. Nails are tender, long, thickened and dystrophic with subungual debris, consistent with onychomycosis, 1-5 on right and 2-5 on left. No signs of infection. No open lesions, +  preulcerative lesions present bilateral sub 5 and sub 2 on left and sub 4 on right. Remaining integument unremarkable.  Vasculature:  Dorsalis Pedis pulse 2/4 bilateral. Posterior Tibial pulse  1/4 bilateral. Capillary fill time 4 sec. Scant hair growth to the level of the digits.Temperature gradient within normal limits. + varicosities present bilateral. No edema present bilateral.   Neurology: The patient has intact sensation measured with a 5.07/10g Semmes Weinstein Monofilament at all pedal sites bilateral. Vibratory sensation absent on left to level of ankle and diminished on right with tuning fork. No Babinski sign present bilateral.   Musculoskeletal: Bunion and hammertoe on right, Status post amputation of Left great toe, Muscular strength 5/5 in all lower extremity muscular groups bilateral without pain on range of motion . No tenderness with calf compression bilateral.  Assessment and Plan: Problem List Items Addressed This Visit    None    Visit Diagnoses    Pre-ulcerative calluses    -  Primary   Dermatophytosis of nail       Toe amputation status, left (HCC)       Type 2 diabetes, controlled, with neuropathy ([facility])       Foot pain, bilateral         -Examined patient. -Discussed and educated patient on diabetic  foot care, especially with regards to the vascular, neurological and musculoskeletal systems.  -Stressed the importance of good glycemic control and the detriment of not controlling glucose levels in relation to the foot. -Mechanically debrided pre-ulcerative callus x 3 using bur and all nails x9 bilateral using sterile nail nipper and filed with dremel without incident  -Continue with diabetic shoes -Answered all patient questions -Patient to return in 3 months for at risk foot  care -Patient advised to call the office if any problems or questions arise in the meantime.  [name], DPM

## 2016-12-24 DIAGNOSIS — E785 Hyperlipidemia, unspecified: Secondary | ICD-10-CM | POA: Diagnosis not present

## 2016-12-24 DIAGNOSIS — I2510 Atherosclerotic heart disease of native coronary artery without angina pectoris: Secondary | ICD-10-CM | POA: Diagnosis not present

## 2016-12-24 DIAGNOSIS — E088 Diabetes mellitus due to underlying condition with unspecified complications: Secondary | ICD-10-CM | POA: Diagnosis not present

## 2016-12-24 DIAGNOSIS — I10 Essential (primary) hypertension: Secondary | ICD-10-CM | POA: Diagnosis not present

## 2016-12-24 DIAGNOSIS — N289 Disorder of kidney and ureter, unspecified: Secondary | ICD-10-CM | POA: Diagnosis not present

## 2017-01-28 DIAGNOSIS — E1151 Type 2 diabetes mellitus with diabetic peripheral angiopathy without gangrene: Secondary | ICD-10-CM | POA: Diagnosis not present

## 2017-01-28 DIAGNOSIS — I1310 Hypertensive heart and chronic kidney disease without heart failure, with stage 1 through stage 4 chronic kidney disease, or unspecified chronic kidney disease: Secondary | ICD-10-CM | POA: Diagnosis not present

## 2017-01-28 DIAGNOSIS — E559 Vitamin D deficiency, unspecified: Secondary | ICD-10-CM | POA: Diagnosis not present

## 2017-01-28 DIAGNOSIS — E785 Hyperlipidemia, unspecified: Secondary | ICD-10-CM | POA: Diagnosis not present

## 2017-01-28 DIAGNOSIS — Z87898 Personal history of other specified conditions: Secondary | ICD-10-CM | POA: Diagnosis not present

## 2017-01-28 DIAGNOSIS — Z89419 Acquired absence of unspecified great toe: Secondary | ICD-10-CM | POA: Diagnosis not present

## 2017-01-28 DIAGNOSIS — G4700 Insomnia, unspecified: Secondary | ICD-10-CM | POA: Diagnosis not present

## 2017-01-28 DIAGNOSIS — E1122 Type 2 diabetes mellitus with diabetic chronic kidney disease: Secondary | ICD-10-CM | POA: Diagnosis not present

## 2017-01-28 DIAGNOSIS — E1142 Type 2 diabetes mellitus with diabetic polyneuropathy: Secondary | ICD-10-CM | POA: Diagnosis not present

## 2017-01-28 DIAGNOSIS — E1165 Type 2 diabetes mellitus with hyperglycemia: Secondary | ICD-10-CM | POA: Diagnosis not present

## 2017-01-28 DIAGNOSIS — Z79899 Other long term (current) drug therapy: Secondary | ICD-10-CM | POA: Diagnosis not present

## 2017-01-28 DIAGNOSIS — N183 Chronic kidney disease, stage 3 (moderate): Secondary | ICD-10-CM | POA: Diagnosis not present

## 2017-02-12 DIAGNOSIS — J209 Acute bronchitis, unspecified: Secondary | ICD-10-CM | POA: Diagnosis not present

## 2017-02-12 DIAGNOSIS — R05 Cough: Secondary | ICD-10-CM | POA: Diagnosis not present

## 2017-03-12 ENCOUNTER — Ambulatory Visit (INDEPENDENT_AMBULATORY_CARE_PROVIDER_SITE_OTHER): Payer: Medicare Other | Admitting: Sports Medicine

## 2017-03-12 ENCOUNTER — Encounter: Payer: Self-pay | Admitting: Sports Medicine

## 2017-03-12 DIAGNOSIS — Z89422 Acquired absence of other left toe(s): Secondary | ICD-10-CM

## 2017-03-12 DIAGNOSIS — B351 Tinea unguium: Secondary | ICD-10-CM | POA: Diagnosis not present

## 2017-03-12 DIAGNOSIS — L84 Corns and callosities: Secondary | ICD-10-CM

## 2017-03-12 DIAGNOSIS — E1140 Type 2 diabetes mellitus with diabetic neuropathy, unspecified: Secondary | ICD-10-CM

## 2017-03-12 DIAGNOSIS — IMO0002 Reserved for concepts with insufficient information to code with codable children: Secondary | ICD-10-CM

## 2017-03-12 NOTE — Progress Notes (Signed)
Patient ID: ROBIE Barajas, male   DOB: Mar 21, 1941, 76 y.o.   MRN: [id number]  Subjective: [name] is a 76 y.o. male patient with history of type 2 diabetes who presents to office today complaining of callus and long, painful nails  while ambulating in shoes; unable to trim. Patient states that the glucose reading this morning was "good", 132.  Patient denies any other new changes in medication or new problems. Patient denies any new cramping, numbness, burning or tingling in the legs.  Patient Active Problem List   Diagnosis Date Noted  . ATN (acute tubular necrosis) ([place]) 03/28/2016  . Hematuria 03/28/2016  . Hyperkalemia 03/28/2016  . Benign hypertension 03/13/2016  . Type 2 diabetes mellitus ([place]) 03/13/2016  . Arteriosclerosis of coronary artery 12/14/2015  . Benign essential HTN 12/14/2015  . HLD (hyperlipidemia) 12/14/2015  . Dyslipidemia 09/25/2015  . Impaired renal function 09/25/2015  . Aftercare following surgery of the circulatory system, [place] 10/15/2013  . Peripheral vascular disease, unspecified ([place]) 09/02/2012  . Occlusion and stenosis of carotid artery without mention of cerebral infarction 09/02/2012  . DM (diabetes mellitus) ([place]) 06/13/2011   Current Outpatient Prescriptions on File Prior to Visit  Medication Sig Dispense Refill  . aspirin EC 325 MG tablet Take 325 mg by mouth daily.    . carvedilol (COREG) 12.5 MG tablet Take 12.5 mg by mouth 2 (two) times daily with a meal.    . ezetimibe (ZETIA) 10 MG tablet Take 10 mg by mouth daily.    . fenofibrate micronized (ANTARA) 130 MG capsule Take 130 mg by mouth daily before breakfast.      . lisinopril (PRINIVIL,ZESTRIL) 40 MG tablet     . rosuvastatin (CRESTOR) 40 MG tablet Take 20 mg by mouth daily.      No current facility-administered medications on file prior to visit.    No Known Allergies   Objective: General: Patient is awake, alert, and oriented x 3 and in no acute distress.  Integument: Skin is warm,  dry and supple bilateral. Nails are tender, long, thickened and dystrophic with subungual debris, consistent with onychomycosis, 1-5 on right and 2-5 on left. No signs of infection. No open lesions, +  preulcerative lesions present bilateral sub 5 and sub 2 on left and sub 4 on right. Remaining integument unremarkable.  Vasculature:  Dorsalis Pedis pulse 2/4 bilateral. Posterior Tibial pulse  1/4 bilateral. Capillary fill time 4 sec. Scant hair growth to the level of the digits.Temperature gradient within normal limits. + varicosities present bilateral. No edema present bilateral.   Neurology: The patient has intact sensation measured with a 5.07/10g Semmes Weinstein Monofilament at all pedal sites bilateral. Vibratory sensation absent on left to level of ankle and diminished on right with tuning fork. No Babinski sign present bilateral.   Musculoskeletal: Bunion and hammertoe on right, Status post amputation of Left great toe, Muscular strength 5/5 in all lower extremity muscular groups bilateral without pain on range of motion . No tenderness with calf compression bilateral.  Assessment and Plan: Problem List Items Addressed This Visit    None    Visit Diagnoses    Pre-ulcerative calluses    -  Primary   Dermatophytosis of nail       Toe amputation status, left (HCC)       Type 2 diabetes, controlled, with neuropathy ([place])         -Examined patient. -Discussed and educated patient on diabetic foot care, especially with regards to the  vascular, neurological and musculoskeletal systems.  -Stressed the importance of good glycemic control and the detriment of not controlling glucose levels in relation to the foot. -Mechanically debrided pre-ulcerative callus x 3 using bur and all nails x9 bilateral using sterile nail nipper and filed with dremel without incident  -Patient to meet with Benjie Karvonen today to be molded for diabetic shoes with custom insole with toe-filler for left -Answered all patient  questions -Patient to return in 3 months for at risk foot care -Patient advised to call the office if any problems or questions arise in the meantime.  [name], DPM

## 2017-03-17 ENCOUNTER — Encounter: Payer: Self-pay | Admitting: Family

## 2017-03-20 ENCOUNTER — Ambulatory Visit (INDEPENDENT_AMBULATORY_CARE_PROVIDER_SITE_OTHER)
Admission: RE | Admit: 2017-03-20 | Discharge: 2017-03-20 | Disposition: A | Discharge status: Home / Self Care | Payer: Medicare Other | Source: Ambulatory Visit | Attending: Family | Admitting: Family

## 2017-03-20 ENCOUNTER — Ambulatory Visit (INDEPENDENT_AMBULATORY_CARE_PROVIDER_SITE_OTHER): Payer: Medicare Other | Admitting: Family

## 2017-03-20 ENCOUNTER — Ambulatory Visit (HOSPITAL_COMMUNITY)
Admission: RE | Admit: 2017-03-20 | Discharge: 2017-03-20 | Disposition: A | Discharge status: Home / Self Care | Payer: Medicare Other | Source: Ambulatory Visit | Attending: Family | Admitting: Family

## 2017-03-20 ENCOUNTER — Encounter: Payer: Self-pay | Admitting: Family

## 2017-03-20 VITALS — BP 159/67 | HR 67 | Resp 16 | Ht 68.0 in | Wt 211.2 lb

## 2017-03-20 DIAGNOSIS — E785 Hyperlipidemia, unspecified: Secondary | ICD-10-CM | POA: Diagnosis not present

## 2017-03-20 DIAGNOSIS — I739 Peripheral vascular disease, unspecified: Secondary | ICD-10-CM

## 2017-03-20 DIAGNOSIS — I6521 Occlusion and stenosis of right carotid artery: Secondary | ICD-10-CM | POA: Diagnosis not present

## 2017-03-20 DIAGNOSIS — I10 Essential (primary) hypertension: Secondary | ICD-10-CM | POA: Diagnosis not present

## 2017-03-20 DIAGNOSIS — Z9889 Other specified postprocedural states: Secondary | ICD-10-CM | POA: Diagnosis not present

## 2017-03-20 DIAGNOSIS — I6523 Occlusion and stenosis of bilateral carotid arteries: Secondary | ICD-10-CM

## 2017-03-20 DIAGNOSIS — R0989 Other specified symptoms and signs involving the circulatory and respiratory systems: Secondary | ICD-10-CM | POA: Diagnosis not present

## 2017-03-20 DIAGNOSIS — Z95828 Presence of other vascular implants and grafts: Secondary | ICD-10-CM | POA: Diagnosis not present

## 2017-03-20 DIAGNOSIS — I779 Disorder of arteries and arterioles, unspecified: Secondary | ICD-10-CM | POA: Diagnosis not present

## 2017-03-20 DIAGNOSIS — I6522 Occlusion and stenosis of left carotid artery: Secondary | ICD-10-CM

## 2017-03-20 DIAGNOSIS — E1151 Type 2 diabetes mellitus with diabetic peripheral angiopathy without gangrene: Secondary | ICD-10-CM | POA: Diagnosis not present

## 2017-03-20 DIAGNOSIS — Z87891 Personal history of nicotine dependence: Secondary | ICD-10-CM | POA: Diagnosis not present

## 2017-03-20 LAB — VAS US CAROTID
LCCADSYS: -99 cm/s
LCCAPSYS: 77 cm/s
LEFT ECA DIAS: 3 cm/s
LEFT VERTEBRAL DIAS: -13 cm/s
LICADDIAS: -25 cm/s
Left CCA dist dias: -14 cm/s
Left CCA prox dias: 6 cm/s
Left ICA dist sys: -106 cm/s
Left ICA prox dias: 13 cm/s
Left ICA prox sys: 97 cm/s
RCCADSYS: -103 cm/s
RCCAPDIAS: 11 cm/s
RIGHT CCA MID DIAS: 13 cm/s
RIGHT ECA DIAS: -4 cm/s
RIGHT VERTEBRAL DIAS: -10 cm/s
Right CCA prox sys: 193 cm/s

## 2017-03-20 NOTE — Patient Instructions (Signed)
Stroke Prevention Some medical conditions and behaviors are associated with an increased chance of having a stroke. You may prevent a stroke by making healthy choices and managing medical conditions. How can I reduce my risk of having a stroke?  Stay physically active. Get at least 30 minutes of activity on most or all days.  Do not smoke. It may also be helpful to avoid exposure to secondhand smoke.  Limit alcohol use. Moderate alcohol use is considered to be:  No more than 2 drinks per day for men.  No more than 1 drink per day for nonpregnant women.  Eat healthy foods. This involves:  Eating 5 or more servings of fruits and vegetables a day.  Making dietary changes that address high blood pressure (hypertension), high cholesterol, diabetes, or obesity.  Manage your cholesterol levels.  Making food choices that are high in fiber and low in saturated fat, trans fat, and cholesterol may control cholesterol levels.  Take any prescribed medicines to control cholesterol as directed by your health care provider.  Manage your diabetes.  Controlling your carbohydrate and sugar intake is recommended to manage diabetes.  Take any prescribed medicines to control diabetes as directed by your health care provider.  Control your hypertension.  Making food choices that are low in salt (sodium), saturated fat, trans fat, and cholesterol is recommended to manage hypertension.  Ask your health care provider if you need treatment to lower your blood pressure. Take any prescribed medicines to control hypertension as directed by your health care provider.  If you are 18-39 years of age, have your blood pressure checked every 3-5 years. If you are 40 years of age or older, have your blood pressure checked every year.  Maintain a healthy weight.  Reducing calorie intake and making food choices that are low in sodium, saturated fat, trans fat, and cholesterol are recommended to manage  weight.  Stop drug abuse.  Avoid taking birth control pills.  Talk to your health care provider about the risks of taking birth control pills if you are over 35 years old, smoke, get migraines, or have ever had a blood clot.  Get evaluated for sleep disorders (sleep apnea).  Talk to your health care provider about getting a sleep evaluation if you snore a lot or have excessive sleepiness.  Take medicines only as directed by your health care provider.  For some people, aspirin or blood thinners (anticoagulants) are helpful in reducing the risk of forming abnormal blood clots that can lead to stroke. If you have the irregular heart rhythm of atrial fibrillation, you should be on a blood thinner unless there is a good reason you cannot take them.  Understand all your medicine instructions.  Make sure that other conditions (such as anemia or atherosclerosis) are addressed. Get help right away if:  You have sudden weakness or numbness of the face, arm, or leg, especially on one side of the body.  Your face or eyelid droops to one side.  You have sudden confusion.  You have trouble speaking (aphasia) or understanding.  You have sudden trouble seeing in one or both eyes.  You have sudden trouble walking.  You have dizziness.  You have a loss of balance or coordination.  You have a sudden, severe headache with no known cause.  You have new chest pain or an irregular heartbeat. Any of these symptoms may represent a serious problem that is an emergency. Do not wait to see if the symptoms will go away.   Get medical help at once. Call your local emergency services (911 in [place] Do not drive yourself to the hospital.  This information is not intended to replace advice given to you by your health care provider. Make sure you discuss any questions you have with your health care provider. Document Released: 11/28/2004 Document Revised: 03/28/2016 Document Reviewed: 04/23/2013 Elsevier  Interactive Patient Education  2017 [employer] Inc[employer]      Peripheral Vascular Disease Peripheral vascular disease (PVD) is a disease of the blood vessels that are not part of your heart and brain. A simple term for PVD is poor circulation. In most cases, PVD narrows the blood vessels that carry blood from your heart to the rest of your body. This can result in a decreased supply of blood to your arms, legs, and internal organs, like your stomach or kidneys. However, it most often affects a person's lower legs and feet. There are two types of PVD.  Organic PVD. This is the more common type. It is caused by damage to the structure of blood vessels.  Functional PVD. This is caused by conditions that make blood vessels contract and tighten (spasm). Without treatment, PVD tends to get worse over time. PVD can also lead to acute ischemic limb. This is when an arm or limb suddenly has trouble getting enough blood. This is a medical emergency. Follow these instructions at home:  Take medicines only as told by your doctor.  Do not use any tobacco products, including cigarettes, chewing tobacco, or electronic cigarettes. If you need help quitting, ask your doctor.  Lose weight if you are overweight, and maintain a healthy weight as told by your doctor.  Eat a diet that is low in fat and cholesterol. If you need help, ask your doctor.  Exercise regularly. Ask your doctor for some good activities for you.  Take good care of your feet.  Wear comfortable shoes that fit well.  Check your feet often for any cuts or sores. Contact a doctor if:  You have cramps in your legs while walking.  You have leg pain when you are at rest.  You have coldness in a leg or foot.  Your skin changes.  You are unable to get or have an erection (erectile dysfunction).  You have cuts or sores on your feet that are not healing. Get help right away if:  Your arm or leg turns cold and blue.  Your arms or legs  become red, warm, swollen, painful, or numb.  You have chest pain or trouble breathing.  You suddenly have weakness in your face, arm, or leg.  You become very confused or you cannot speak.  You suddenly have a very bad headache.  You suddenly cannot see. This information is not intended to replace advice given to you by your health care provider. Make sure you discuss any questions you have with your health care provider. Document Released: 01/15/2010 Document Revised: 03/28/2016 Document Reviewed: 03/31/2014 Elsevier Interactive Patient Education  2017 [employer] Inc.  

## 2017-03-20 NOTE — Progress Notes (Signed)
[facility] [place] HISTORY AND PHYSICAL   MRN : [id number]  History of Present Illness:   [name] is a 76 y.o. male who is status post left great toe amputation by Dr. Oneida Alar with left mid superficial femoral artery to dorsalis pedis artery bypass graft in 2006, left superficial femoral artery endarterectomy in 2009. The patient also had a left CEA in [employer].  He returns today for carotid artery and PAD surveillance.  Pt reports a mild tired feeling in both calves after walking about 1.5 miles, relieved by rest, uses elliptical exercise machine daily.  He denies non healing wounds.  The patient denies a history of TIA or stroke symptoms. Specifically he denies a history of amaurosis fugax or monocular blindness, unilateral facial drooping, hemiparesis, or receptive or expressive aphasia.  He denies tingling, numbness, cold sensation, or weakness in either hand/arm. States his blood pressure at home is about 135/65, states he has "white coat syndrome".  He will have a cystoscope next week for a "blockage" related to his left kidney; this will be performed in [place].   Pt Diabetic: Yes, states in good control, he cannot recall his last A1C, self reports FBS as 120-160; pt states he does not know why the Victoza was stopped by his PCP as Victoza seemed to help his blood sugar better and he had no nausea. Pt smoker: former smoker, quit about 1994  Pt meds include: Statin :Yes Betablocker: Yes ASA: Yes Other anticoagulants/antiplatelets: no   Current Outpatient Prescriptions  Medication Sig Dispense Refill  . aspirin EC 325 MG tablet Take 325 mg by mouth daily.    . carvedilol (COREG) 12.5 MG tablet Take 12.5 mg by mouth 2 (two) times daily with a meal.    . ezetimibe (ZETIA) 10 MG tablet Take 10 mg by mouth daily.    . fenofibrate micronized (ANTARA) 130 MG capsule Take 130 mg by mouth daily before breakfast.      . lisinopril  (PRINIVIL,ZESTRIL) 40 MG tablet     . pioglitazone (ACTOS) 45 MG tablet Take 45 mg by mouth every morning.    . rosuvastatin (CRESTOR) 40 MG tablet Take 20 mg by mouth daily.     . traZODone (DESYREL) 50 MG tablet Take 50 mg by mouth at bedtime.     No current facility-administered medications for this visit.     Past Medical History:  Diagnosis Date  . Arthritis   . CAD (coronary artery disease)   . Carotid artery occlusion   . Diabetes mellitus Age 76  . Hyperlipidemia   . Hypertension   . Joint pain   . Peripheral vascular disease ([place])   . Ulcer     Social History Social History  Substance Use Topics  . Smoking status: Former Smoker    Quit date: 11/04/1978  . Smokeless tobacco: Never Used  . Alcohol use No    Family History Family History  Problem Relation Age of Onset  . Heart disease Father        Heart Disease before age 37  . Heart attack Father   . Cancer Mother        Bladder    Surgical History Past Surgical History:  Procedure Laterality Date  . AMPUTATION     left first toe  . APPENDECTOMY    . CAROTID ENDARTERECTOMY Left 2000   CE  . FOOT SURGERY    . PR VEIN BYPASS GRAFT,AORTO-FEM-POP  07/2005   left femoral  to dorsalis pedis artery bypass    No Known Allergies  Current Outpatient Prescriptions  Medication Sig Dispense Refill  . aspirin EC 325 MG tablet Take 325 mg by mouth daily.    . carvedilol (COREG) 12.5 MG tablet Take 12.5 mg by mouth 2 (two) times daily with a meal.    . ezetimibe (ZETIA) 10 MG tablet Take 10 mg by mouth daily.    . fenofibrate micronized (ANTARA) 130 MG capsule Take 130 mg by mouth daily before breakfast.      . lisinopril (PRINIVIL,ZESTRIL) 40 MG tablet     . pioglitazone (ACTOS) 45 MG tablet Take 45 mg by mouth every morning.    . rosuvastatin (CRESTOR) 40 MG tablet Take 20 mg by mouth daily.     . traZODone (DESYREL) 50 MG tablet Take 50 mg by mouth at bedtime.     No current facility-administered medications  for this visit.      REVIEW OF SYSTEMS: See HPI for pertinent positives and negatives.  Physical Examination Vitals:   03/20/17 1323 03/20/17 1327  BP: (!) 151/64 (!) 159/67  Pulse: 65 67  Resp: 16   SpO2: 94%   Weight: 211 lb 3 oz (95.8 kg)   Height: 5\' 8"  (1.727 m)    Body mass index is 32.11 kg/m.  General:  WDWN obese male in NAD Gait: Normal HENT: WNL Eyes: Pupils equal Pulmonary: normal non-labored breathing, CTAB Cardiac: RRR with occasional dropped contractions, no detectedmurmur  Abdomen: soft, NT, no masses palpated Skin: no rashes, no ulcers, no cellulitis.   VASCULAR EXAM  Carotid Bruits Left Right   Negative Negative  Aorta is not palpable radial pulses are 2+ palpable and =    Extremities without ischemic changes  without Gangrene; without open wounds.     LE Pulses LEFT RIGHT   FEMORAL 2+ palpable 1+ palpable    POPLITEAL not palpable  not palpable   POSTERIOR TIBIAL not palpable  not palpable    DORSALIS PEDIS  ANTERIOR TIBIAL 3+ palpable  1+ palpable      Musculoskeletal: no muscle wasting or atrophy; no peripheral edema, left great toe surgically absent. Neurologic: A&O X 3; Appropriate Affect ;  SENSATION: normal; MOTOR FUNCTION: 5/5 Symmetric, CN 2-12 intact Speech is fluent/normal      ASSESSMENT:  [name] is a 76 y.o. male who is status post left great toe amputation by Dr. Oneida Alar with left mid superficial femoral artery to dorsalis pedis artery bypass graft in 2006, left superficial femoral artery endarterectomy in 2009. The patient also had a left CEA in [employer].  He has mild bilateral calf claudication symptoms, has good exercise capacity, no signs of ischemia in his  feet/legs. He has no history of stroke or TIA.   DATA (03/20/17):  Carotid Duplex: Right ICA with <40% stenosis. Left ICA: CEA site with no evidence of restenosis or hyperplasia Bilateral vertebral artery flow is antegrade.  Bilateral subclavian artery waveforms are normal.  No significant change compared to 03-15-16 exam.   Left LE Arterial Duplex: Patent left LE bypass graft with no evidence of restenosis.  Homogenous plaque in the native inflow segment with minimal extension into the proximal anastomosis.  All biphasic waveforms. No significant change compared to the exam on 03-15-16.    ABI:  R: 0.93 (0.82, 03-15-16), waveforms: monophasic, TBI: 0.72 (0.55)  L: 1.03 (1.05), waveforms: biphasic, TBI: great toe surgically absent  Stable bilateral ABI: unable to obtain reliable right ABI since  monophasic waveforms do not correspond to 0.93, likely a result of medial calcification.  Left ABI remains in the normal range with biphasic waveforms.    PLAN:   Continue daily exercising.  Based on today's exam and non-invasive vascular lab results, the patient will follow up in 1 year with the following tests: carotid duplex, ABI, and left LE arterial duplex. I advised pt to notify us if he develops concerns re the circulation in his legs. I discussed in depth with the patient the nature of atherosclerosis, and emphasized the importance of maximal medical management including strict control of blood pressure, blood glucose, and lipid levels, obtaining regular exercise, and cessation of smoking.  The patient is aware that without maximal medical management the underlying atherosclerotic disease process will progress, limiting the benefit of any interventions.  The patient was given information about stroke prevention and what symptoms should prompt the patient to seek immediate medical care.  The patient was given information about PAD including signs, symptoms, treatment, what  symptoms should prompt the patient to seek immediate medical care, and risk reduction measures to take. Thank you for allowing [place] to participate in this patient's care.  Clemon Chambers, RN, MSN, FNP-C [facility] Office: [phone]  Clinic MD: Oneida Alar 03/20/2017 1:32 PM

## 2017-03-26 NOTE — Addendum Note (Signed)
Addended by: Lianne Cure A on: 03/26/2017 01:28 PM   Modules accepted: Orders

## 2017-04-02 DIAGNOSIS — N183 Chronic kidney disease, stage 3 (moderate): Secondary | ICD-10-CM | POA: Diagnosis not present

## 2017-04-08 DIAGNOSIS — E669 Obesity, unspecified: Secondary | ICD-10-CM | POA: Diagnosis not present

## 2017-04-08 DIAGNOSIS — E1122 Type 2 diabetes mellitus with diabetic chronic kidney disease: Secondary | ICD-10-CM | POA: Diagnosis not present

## 2017-04-08 DIAGNOSIS — I129 Hypertensive chronic kidney disease with stage 1 through stage 4 chronic kidney disease, or unspecified chronic kidney disease: Secondary | ICD-10-CM | POA: Diagnosis not present

## 2017-04-08 DIAGNOSIS — N183 Chronic kidney disease, stage 3 (moderate): Secondary | ICD-10-CM | POA: Diagnosis not present

## 2017-04-14 DIAGNOSIS — L97509 Non-pressure chronic ulcer of other part of unspecified foot with unspecified severity: Secondary | ICD-10-CM | POA: Diagnosis not present

## 2017-04-14 DIAGNOSIS — E11621 Type 2 diabetes mellitus with foot ulcer: Secondary | ICD-10-CM | POA: Diagnosis not present

## 2017-04-14 DIAGNOSIS — L03032 Cellulitis of left toe: Secondary | ICD-10-CM | POA: Diagnosis not present

## 2017-04-30 DIAGNOSIS — M2042 Other hammer toe(s) (acquired), left foot: Secondary | ICD-10-CM | POA: Diagnosis not present

## 2017-04-30 DIAGNOSIS — N183 Chronic kidney disease, stage 3 (moderate): Secondary | ICD-10-CM | POA: Diagnosis not present

## 2017-04-30 DIAGNOSIS — M2041 Other hammer toe(s) (acquired), right foot: Secondary | ICD-10-CM | POA: Diagnosis not present

## 2017-04-30 DIAGNOSIS — E1142 Type 2 diabetes mellitus with diabetic polyneuropathy: Secondary | ICD-10-CM | POA: Diagnosis not present

## 2017-04-30 DIAGNOSIS — E1151 Type 2 diabetes mellitus with diabetic peripheral angiopathy without gangrene: Secondary | ICD-10-CM | POA: Diagnosis not present

## 2017-04-30 DIAGNOSIS — E1122 Type 2 diabetes mellitus with diabetic chronic kidney disease: Secondary | ICD-10-CM | POA: Diagnosis not present

## 2017-04-30 DIAGNOSIS — Z89419 Acquired absence of unspecified great toe: Secondary | ICD-10-CM | POA: Diagnosis not present

## 2017-04-30 DIAGNOSIS — L97509 Non-pressure chronic ulcer of other part of unspecified foot with unspecified severity: Secondary | ICD-10-CM | POA: Diagnosis not present

## 2017-04-30 DIAGNOSIS — E11621 Type 2 diabetes mellitus with foot ulcer: Secondary | ICD-10-CM | POA: Diagnosis not present

## 2017-05-21 ENCOUNTER — Ambulatory Visit (INDEPENDENT_AMBULATORY_CARE_PROVIDER_SITE_OTHER): Payer: Medicare Other | Admitting: Sports Medicine

## 2017-05-21 DIAGNOSIS — S91119A Laceration without foreign body of unspecified toe without damage to nail, initial encounter: Secondary | ICD-10-CM

## 2017-05-21 DIAGNOSIS — B351 Tinea unguium: Secondary | ICD-10-CM | POA: Diagnosis not present

## 2017-05-21 DIAGNOSIS — M79672 Pain in left foot: Secondary | ICD-10-CM

## 2017-05-21 DIAGNOSIS — I739 Peripheral vascular disease, unspecified: Secondary | ICD-10-CM

## 2017-05-21 DIAGNOSIS — M79671 Pain in right foot: Secondary | ICD-10-CM

## 2017-05-21 DIAGNOSIS — IMO0002 Reserved for concepts with insufficient information to code with codable children: Secondary | ICD-10-CM

## 2017-05-21 DIAGNOSIS — E1140 Type 2 diabetes mellitus with diabetic neuropathy, unspecified: Secondary | ICD-10-CM

## 2017-05-21 DIAGNOSIS — Z89422 Acquired absence of other left toe(s): Secondary | ICD-10-CM

## 2017-05-21 DIAGNOSIS — L84 Corns and callosities: Secondary | ICD-10-CM

## 2017-05-21 MED ORDER — AMOXICILLIN-POT CLAVULANATE 875-125 MG PO TABS: 1.0000 | ORAL_TABLET | Freq: Two times a day (BID) | ORAL | 0 refills | Status: DC

## 2017-05-21 NOTE — Progress Notes (Signed)
Patient ID: [name], male   DOB: 03/24/1941, 76 y.o.   MRN: [id number]  Subjective: [name] is a 76 y.o. male patient with history of type 2 diabetes who presents to office today complaining of callus and long, painful nails  while ambulating in shoes; unable to trim. Patient states that the glucose reading this morning was "good". Reports on Saturday, he stubbed his left second toe and since he has stubbed it. He has been applying Silvadene cream and has used a postoperative shoe with improvement in symptoms to the toe. Patient denies any constitutional symptoms at this time.   Patient Active Problem List   Diagnosis Date Noted  . ATN (acute tubular necrosis) ([place]) 03/28/2016  . Hematuria 03/28/2016  . Hyperkalemia 03/28/2016  . Benign hypertension 03/13/2016  . Type 2 diabetes mellitus ([place]) 03/13/2016  . Arteriosclerosis of coronary artery 12/14/2015  . Benign essential HTN 12/14/2015  . HLD (hyperlipidemia) 12/14/2015  . Dyslipidemia 09/25/2015  . Impaired renal function 09/25/2015  . Aftercare following surgery of the circulatory system, [place] 10/15/2013  . Peripheral vascular disease, unspecified ([place]) 09/02/2012  . Occlusion and stenosis of carotid artery without mention of cerebral infarction 09/02/2012  . DM (diabetes mellitus) ([place]) 06/13/2011   Current Outpatient Prescriptions on File Prior to Visit  Medication Sig Dispense Refill  . aspirin EC 325 MG tablet Take 325 mg by mouth daily.    . carvedilol (COREG) 12.5 MG tablet Take 12.5 mg by mouth 2 (two) times daily with a meal.    . ezetimibe (ZETIA) 10 MG tablet Take 10 mg by mouth daily.    . fenofibrate micronized (ANTARA) 130 MG capsule Take 130 mg by mouth daily before breakfast.      . lisinopril (PRINIVIL,ZESTRIL) 40 MG tablet     . pioglitazone (ACTOS) 45 MG tablet Take 45 mg by mouth every morning.    . rosuvastatin (CRESTOR) 40 MG tablet Take 20 mg by mouth daily.     . traZODone (DESYREL) 50 MG tablet Take 50  mg by mouth at bedtime.     No current facility-administered medications on file prior to visit.    No Known Allergies   Objective: General: Patient is awake, alert, and oriented x 3 and in no acute distress.  Integument: Skin is warm, dry and supple bilateral. Nails are tender, long, thickened and dystrophic with subungual debris, consistent with onychomycosis, 1-5 on right and 2-5 on left. No signs of infection. No open lesions, +  preulcerative lesions present bilateral sub 5 and sub 2 on left and sub 4 on right With minimal callusing on today's visit. There is a small laceration at the distal tuft of the left second toe with no surrounding edema, erythema or acute signs of infection. Remaining integument unremarkable.  Vasculature:  Dorsalis Pedis pulse 2/4 bilateral. Posterior Tibial pulse  1/4 bilateral. Capillary fill time 4 sec. Scant hair growth to the level of the digits.Temperature gradient within normal limits. + varicosities present bilateral. No edema present bilateral.   Neurology: The patient has intact sensation measured with a 5.07/10g Semmes Weinstein Monofilament at all pedal sites bilateral. Vibratory sensation absent on left to level of ankle and diminished on right with tuning fork. No Babinski sign present bilateral.   Musculoskeletal: Bunion and hammertoe on right, Status post amputation of Left great toe, Muscular strength 5/5 in all lower extremity muscular groups bilateral without pain on range of motion . No tenderness with calf compression bilateral.  Assessment  and Plan: Problem List Items Addressed This Visit      Cardiovascular and Mediastinum   Peripheral vascular disease, unspecified ([place])    Other Visit Diagnoses    Laceration of skin of toe, initial encounter    -  Primary   Relevant Medications   amoxicillin-clavulanate (AUGMENTIN) 875-125 MG tablet   Dermatophytosis of nail       Pre-ulcerative calluses       Toe amputation status, left (HCC)        Type 2 diabetes, controlled, with neuropathy (HCC)       Foot pain, bilateral         -Examined patient. -Discussed and educated patient on diabetic foot care, especially with regards to the vascular, neurological and musculoskeletal systems.  -Stressed the importance of good glycemic control and the detriment of not controlling glucose levels in relation to the foot. -Mechanically smoothed with rotary bur, Pre-ulcerative callus that was minimal at today's visitx 3 and mechanically debrided  all nails x9 bilateral using sterile nail nipper and filed with dremel without incident  -Cleansed left second toe laceration and applied Silvadene cream covered with Band-Aid. Advised patient to continue to do the same and to continue with open postoperative shoe until area heals. Prescribed Augmentin for preventative measures. Since patient is high risk with laceration and history of diabetes with previous amputation. Although there were no major gross clinical signs of infection at the left second toe laceration. -Patient to return  as needed. If laceration fails to continue to heal on its all or in 2.5 months as scheduled for routine diabetic foot care.  -Patient advised to call the office if any problems or questions arise in the meantime.  [name], DPM

## 2017-05-27 DIAGNOSIS — E559 Vitamin D deficiency, unspecified: Secondary | ICD-10-CM | POA: Diagnosis not present

## 2017-05-27 DIAGNOSIS — E785 Hyperlipidemia, unspecified: Secondary | ICD-10-CM | POA: Diagnosis not present

## 2017-05-27 DIAGNOSIS — N183 Chronic kidney disease, stage 3 (moderate): Secondary | ICD-10-CM | POA: Diagnosis not present

## 2017-05-27 DIAGNOSIS — Z79899 Other long term (current) drug therapy: Secondary | ICD-10-CM | POA: Diagnosis not present

## 2017-05-27 DIAGNOSIS — E1122 Type 2 diabetes mellitus with diabetic chronic kidney disease: Secondary | ICD-10-CM | POA: Diagnosis not present

## 2017-05-27 DIAGNOSIS — G4700 Insomnia, unspecified: Secondary | ICD-10-CM | POA: Diagnosis not present

## 2017-05-27 DIAGNOSIS — I1310 Hypertensive heart and chronic kidney disease without heart failure, with stage 1 through stage 4 chronic kidney disease, or unspecified chronic kidney disease: Secondary | ICD-10-CM | POA: Diagnosis not present

## 2017-06-18 ENCOUNTER — Ambulatory Visit: Payer: Medicare Other | Admitting: Cardiology

## 2017-06-25 ENCOUNTER — Ambulatory Visit (INDEPENDENT_AMBULATORY_CARE_PROVIDER_SITE_OTHER): Payer: Medicare Other | Admitting: Cardiology

## 2017-06-25 ENCOUNTER — Encounter: Payer: Self-pay | Admitting: Cardiology

## 2017-06-25 VITALS — BP 140/60 | HR 76 | Resp 10 | Ht 68.0 in | Wt 211.0 lb

## 2017-06-25 DIAGNOSIS — Z951 Presence of aortocoronary bypass graft: Secondary | ICD-10-CM

## 2017-06-25 DIAGNOSIS — E785 Hyperlipidemia, unspecified: Secondary | ICD-10-CM

## 2017-06-25 DIAGNOSIS — I2510 Atherosclerotic heart disease of native coronary artery without angina pectoris: Secondary | ICD-10-CM | POA: Diagnosis not present

## 2017-06-25 DIAGNOSIS — I129 Hypertensive chronic kidney disease with stage 1 through stage 4 chronic kidney disease, or unspecified chronic kidney disease: Secondary | ICD-10-CM

## 2017-06-25 DIAGNOSIS — N289 Disorder of kidney and ureter, unspecified: Secondary | ICD-10-CM | POA: Diagnosis not present

## 2017-06-25 DIAGNOSIS — I739 Peripheral vascular disease, unspecified: Secondary | ICD-10-CM

## 2017-06-25 DIAGNOSIS — E088 Diabetes mellitus due to underlying condition with unspecified complications: Secondary | ICD-10-CM

## 2017-06-25 DIAGNOSIS — N183 Chronic kidney disease, stage 3 (moderate): Secondary | ICD-10-CM | POA: Diagnosis not present

## 2017-06-25 MED ORDER — CARVEDILOL 12.5 MG PO TABS
12.5000 mg | ORAL_TABLET | Freq: Two times a day (BID) | ORAL | 3 refills | Status: DC
Start: 2017-06-25 — End: 2019-01-18

## 2017-06-25 MED ORDER — NITROGLYCERIN 0.4 MG SL SUBL: 0.4000 mg | SUBLINGUAL_TABLET | SUBLINGUAL | 6 refills | Status: DC | PRN

## 2017-06-25 MED ORDER — EZETIMIBE 10 MG PO TABS: 10.0000 mg | ORAL_TABLET | Freq: Every day | ORAL | 3 refills | Status: AC

## 2017-06-25 NOTE — Progress Notes (Signed)
Cardiology Office Note:    Date:  06/25/2017   ID:  [name], DOB 1941-07-05, MRN [id number]  PCP:  Charletta Cousin, MD (Inactive)  Cardiologist:  Jenean Lindau, MD   Referring MD: No ref. provider found    ASSESSMENT:    1. Dyslipidemia   2. Diabetes mellitus due to underlying condition with complication, without long-term current use of insulin ([place])   3. Benign hypertension with CKD (chronic kidney disease) stage III   4. Coronary artery disease involving native coronary artery of native heart without angina pectoris   5. Peripheral vascular disease, unspecified ([place])   6. Impaired renal function   7. Hx of CABG    PLAN:    In order of problems listed above:  1. Secondary prevention stressed to the patient. Importance of compliance with diet and medications stressed and he verbalized understanding. His blood pressure stable. Diet was discussed with dyslipidemia and diabetes mellitus. I encouraged him to be more cautious about his diet. His lipids are followed by his primary care physician and we will get a a copy of those records. I cautioned him about optimal control of his weight and diabetes mellitus and lipids especially in view of the fact that he has significant advanced renal insufficiency. I had an extensive discussion with him about this issue and he vocalized understanding. He will be seen in follow-up appointment in 6 months or earlier if he has any concerns.   Medication Adjustments/Labs and Tests Ordered: Current medicines are reviewed at length with the patient today.  Concerns regarding medicines are outlined above.  No orders of the defined types were placed in this encounter.  Meds ordered this encounter  Medications  . ezetimibe (ZETIA) 10 MG tablet    Sig: Take 1 tablet (10 mg total) by mouth daily.    Dispense:  90 tablet    Refill:  3  . carvedilol (COREG) 12.5 MG tablet    Sig: Take 1 tablet (12.5 mg total) by mouth 2 (two) times daily with a meal.    Dispense:  180 tablet    Refill:  3  . nitroGLYCERIN (NITROSTAT) 0.4 MG SL tablet    Sig: Place 1 tablet (0.4 mg total) under the tongue every 5 (five) minutes as needed for chest pain.    Dispense:  30 tablet    Refill:  6     History of Present Illness:    [name] is a 76 y.o. male who is being seen today for the evaluation of Coronary artery disease. Patient has known coronary artery disease, essential hypertension, dyslipidemia, diabetes mellitus, renal insufficiency and patient is overweight. He was evaluated by me and taken care of by me in the previous practice. He is here to get established with a new patient he denies any problems at this time and takes care of activities of daily living. No chest pain orthopnea or PND. At the time of my evaluation is alert awake oriented and in no distress. He leads a sedentary lifestyle and does not exercise on a regular basis. He also is under the evaluation of vascular surgeon for peripheral vascular disease.  Past Medical History:  Diagnosis Date  . Arthritis   . CAD (coronary artery disease)   . Carotid artery occlusion   . Diabetes mellitus Age 84  . Hyperlipidemia   . Hypertension   . Joint pain   . Peripheral vascular disease ([place])   . Ulcer     Past Surgical  History:  Procedure Laterality Date  . AMPUTATION     left first toe  . APPENDECTOMY    . CAROTID ENDARTERECTOMY Left 2000   CE  . FOOT SURGERY    . PR VEIN BYPASS GRAFT,AORTO-FEM-POP  07/2005   left femoral to dorsalis pedis artery bypass    Current Medications: Current Meds  Medication Sig  . aspirin EC 325 MG tablet Take 325 mg by mouth daily.  . carvedilol (COREG) 12.5 MG tablet Take 1 tablet (12.5 mg total) by mouth 2 (two) times daily with a meal.  . ezetimibe (ZETIA) 10 MG tablet Take 1 tablet (10 mg total) by mouth daily.  Marland Kitchen lisinopril (PRINIVIL,ZESTRIL) 40 MG tablet   . pioglitazone (ACTOS) 45 MG tablet Take 45 mg by mouth every morning.  .  rosuvastatin (CRESTOR) 40 MG tablet Take 20 mg by mouth daily.   . [DISCONTINUED] carvedilol (COREG) 12.5 MG tablet Take 12.5 mg by mouth 2 (two) times daily with a meal.  . [DISCONTINUED] ezetimibe (ZETIA) 10 MG tablet Take 10 mg by mouth daily.     Allergies:   Patient has no known allergies.   Social History   Social History  . Marital status: Married    Spouse name: N/A  . Number of children: N/A  . Years of education: N/A   Social History Main Topics  . Smoking status: Former Smoker    Quit date: 11/04/1978  . Smokeless tobacco: Never Used  . Alcohol use No  . Drug use: No  . Sexual activity: Not Asked   Other Topics Concern  . None   Social History Narrative  . None     Family History: The patient's family history includes Cancer in his mother; Heart attack in his father; Heart disease in his father.  ROS:   Please see the history of present illness.    All other systems reviewed and are negative.  EKGs/Labs/Other Studies Reviewed:    The following studies were reviewed today: I reviewed previous hospital records and discussed the patient at extensive length and he verbalized understanding.   Recent Labs: No results found for requested labs within last 8760 hours.  Recent Lipid Panel No results found for: CHOL, TRIG, HDL, CHOLHDL, VLDL, LDLCALC, LDLDIRECT  Physical Exam:    VS:  BP 140/60   Pulse 76   Resp 10   Ht 5\' 8"  (1.727 m)   Wt 211 lb (95.7 kg)   BMI 32.08 kg/m     Wt Readings from Last 3 Encounters:  06/25/17 211 lb (95.7 kg)  03/20/17 211 lb 3 oz (95.8 kg)  03/15/16 202 lb 9.6 oz (91.9 kg)     GEN: Patient is in no acute distress HEENT: Normal NECK: No JVD; No carotid bruits LYMPHATICS: No lymphadenopathy CARDIAC: S1 S2 regular, 2/6 systolic murmur at the apex. RESPIRATORY:  Clear to auscultation without rales, wheezing or rhonchi  ABDOMEN: Soft, non-tender, non-distended MUSCULOSKELETAL:  No edema; No deformity  SKIN: Warm and  dry NEUROLOGIC:  Alert and oriented x 3 PSYCHIATRIC:  Normal affect    Signed, Jenean Lindau, MD  06/25/2017 9:34 AM    [facility]

## 2017-06-25 NOTE — Patient Instructions (Signed)

## 2017-06-27 ENCOUNTER — Ambulatory Visit (INDEPENDENT_AMBULATORY_CARE_PROVIDER_SITE_OTHER): Payer: Medicare Other | Admitting: Sports Medicine

## 2017-06-27 ENCOUNTER — Other Ambulatory Visit: Payer: Self-pay | Admitting: Sports Medicine

## 2017-06-27 ENCOUNTER — Ambulatory Visit (INDEPENDENT_AMBULATORY_CARE_PROVIDER_SITE_OTHER): Payer: Medicare Other

## 2017-06-27 DIAGNOSIS — I739 Peripheral vascular disease, unspecified: Secondary | ICD-10-CM

## 2017-06-27 DIAGNOSIS — S90122A Contusion of left lesser toe(s) without damage to nail, initial encounter: Secondary | ICD-10-CM

## 2017-06-27 DIAGNOSIS — IMO0002 Reserved for concepts with insufficient information to code with codable children: Secondary | ICD-10-CM

## 2017-06-27 DIAGNOSIS — Z89422 Acquired absence of other left toe(s): Secondary | ICD-10-CM | POA: Diagnosis not present

## 2017-06-27 DIAGNOSIS — L84 Corns and callosities: Secondary | ICD-10-CM

## 2017-06-27 DIAGNOSIS — E1140 Type 2 diabetes mellitus with diabetic neuropathy, unspecified: Secondary | ICD-10-CM | POA: Diagnosis not present

## 2017-06-27 DIAGNOSIS — S92525A Nondisplaced fracture of medial phalanx of left lesser toe(s), initial encounter for closed fracture: Secondary | ICD-10-CM

## 2017-06-27 NOTE — Progress Notes (Signed)
Patient ID: [name], male   DOB: 05-24-1941, 76 y.o.   MRN: [id number]  Subjective: [name] is a 76 y.o. male patient with history of type 2 diabetes who presents to office today stating that he stubbed his left 3rd toe and its bruised and wanted to have it checked to make sure it wasn't broken; has used a postoperative shoe with no issues. Patient denies any constitutional symptoms at this time.   Patient Active Problem List   Diagnosis Date Noted  . Hx of CABG 06/25/2017  . ATN (acute tubular necrosis) ([place]) 03/28/2016  . Hematuria 03/28/2016  . Hyperkalemia 03/28/2016  . Benign hypertension with CKD (chronic kidney disease) stage III 03/13/2016  . Type 2 diabetes mellitus ([place]) 03/13/2016  . CAD (coronary artery disease) 12/14/2015  . Dyslipidemia 09/25/2015  . Impaired renal function 09/25/2015  . Peripheral vascular disease, unspecified ([place]) 09/02/2012   Current Outpatient Prescriptions on File Prior to Visit  Medication Sig Dispense Refill  . aspirin EC 325 MG tablet Take 325 mg by mouth daily.    . carvedilol (COREG) 12.5 MG tablet Take 1 tablet (12.5 mg total) by mouth 2 (two) times daily with a meal. 180 tablet 3  . ezetimibe (ZETIA) 10 MG tablet Take 1 tablet (10 mg total) by mouth daily. 90 tablet 3  . lisinopril (PRINIVIL,ZESTRIL) 40 MG tablet     . nitroGLYCERIN (NITROSTAT) 0.4 MG SL tablet Place 1 tablet (0.4 mg total) under the tongue every 5 (five) minutes as needed for chest pain. 30 tablet 6  . pioglitazone (ACTOS) 45 MG tablet Take 45 mg by mouth every morning.    . rosuvastatin (CRESTOR) 40 MG tablet Take 20 mg by mouth daily.      No current facility-administered medications on file prior to visit.    No Known Allergies   Objective: General: Patient is awake, alert, and oriented x 3 and in no acute distress.  Integument: Skin is warm, dry and supple bilateral. Nails are short thickened and dystrophic with subungual debris, consistent with onychomycosis,  1-5 on right and 2-5 on left. No signs of infection. No open lesions, +  preulcerative lesions present bilateral sub 5 and sub 2 on left and sub 4 on right With minimal callusing on today's visit. The left 2nd toe laceration is healed. There is ecchymosis to left 3rd toe with no acute signs of infection. Remaining integument unremarkable.  Vasculature:  Dorsalis Pedis pulse 2/4 bilateral. Posterior Tibial pulse  1/4 bilateral. Capillary fill time 4 sec. Scant hair growth to the level of the digits.Temperature gradient within normal limits. + varicosities present bilateral. No edema present bilateral.   Neurology: The patient has intact sensation measured with a 5.07/10g Semmes Weinstein Monofilament at all pedal sites bilateral. Vibratory sensation absent on left to level of ankle and diminished on right with tuning fork. No Babinski sign present bilateral.   Musculoskeletal: No tenderness to left 3rd toe. Bunion and hammertoe on right, Status post amputation of Left great toe, Muscular strength 5/5 in all lower extremity muscular groups bilateral without pain on range of motion. No tenderness with calf compression bilateral.  Assessment and Plan: Problem List Items Addressed This Visit      Cardiovascular and Mediastinum   Peripheral vascular disease, unspecified ([place])    Other Visit Diagnoses    Contusion of left lesser toe(s) w/o damage to nail, init    -  Primary   Type 2 diabetes, controlled, with neuropathy ([place])  Toe amputation status, left (HCC)       Pre-ulcerative calluses         -Examined patient. -Discussed and educated patient on diabetic foot care, especially with regards to the vascular, neurological and musculoskeletal systems.  -Xrays reviewed with no acute fracture or findings noted -Advised continue with post op shoe until ecchymosis is resolved then may start to break in diabetic shoes as instructed and as dispensed this visit -Patient to return for his scheduled  routine foot care -Patient advised to call the office if any problems or questions arise in the meantime.  [name], DPM

## 2017-06-27 NOTE — Progress Notes (Signed)
Patient ID: [name], male   DOB: 10-Apr-1941, 76 y.o.   MRN: [id number]   Patient presents for diabetic shoe pick up, shoes are tried on for good fit.  Patient received 1 pair [employer] [phone] Athletic walker white/blue lace in men's size 9 extra wide, 3 custom molded diabetic inserts right and one custom molded insert with toe filler left.  Verbal and written break in and wear instructions given.  Patient will follow up for scheduled routine care.

## 2017-06-27 NOTE — Patient Instructions (Signed)

## 2017-08-20 ENCOUNTER — Ambulatory Visit (INDEPENDENT_AMBULATORY_CARE_PROVIDER_SITE_OTHER): Payer: Medicare Other | Admitting: Sports Medicine

## 2017-08-20 DIAGNOSIS — M79671 Pain in right foot: Secondary | ICD-10-CM

## 2017-08-20 DIAGNOSIS — Z89422 Acquired absence of other left toe(s): Secondary | ICD-10-CM

## 2017-08-20 DIAGNOSIS — E1140 Type 2 diabetes mellitus with diabetic neuropathy, unspecified: Secondary | ICD-10-CM

## 2017-08-20 DIAGNOSIS — L84 Corns and callosities: Secondary | ICD-10-CM

## 2017-08-20 DIAGNOSIS — M79672 Pain in left foot: Secondary | ICD-10-CM

## 2017-08-20 DIAGNOSIS — I739 Peripheral vascular disease, unspecified: Secondary | ICD-10-CM

## 2017-08-20 DIAGNOSIS — B351 Tinea unguium: Secondary | ICD-10-CM | POA: Diagnosis not present

## 2017-08-20 DIAGNOSIS — IMO0002 Reserved for concepts with insufficient information to code with codable children: Secondary | ICD-10-CM

## 2017-08-20 NOTE — Progress Notes (Signed)
Patient ID: [name], male   DOB: 11-08-1940, 76 y.o.   MRN: [id number]  Subjective: [name] is a 76 y.o. male patient with history of type 2 diabetes who presents to office today complaining of callus and long, painful nails  while ambulating in shoes; unable to trim. Patient states that the glucose reading this morning was "good", same. Patient denies any other new changes in medication or new problems. Patient denies any new cramping, numbness, burning or tingling in the legs.  Patient Active Problem List   Diagnosis Date Noted  . Hx of CABG 06/25/2017  . ATN (acute tubular necrosis) ([place]) 03/28/2016  . Hematuria 03/28/2016  . Hyperkalemia 03/28/2016  . Benign hypertension with CKD (chronic kidney disease) stage III ([place]) 03/13/2016  . Type 2 diabetes mellitus ([place]) 03/13/2016  . CAD (coronary artery disease) 12/14/2015  . Dyslipidemia 09/25/2015  . Impaired renal function 09/25/2015  . Peripheral vascular disease, unspecified ([place]) 09/02/2012   Current Outpatient Prescriptions on File Prior to Visit  Medication Sig Dispense Refill  . aspirin EC 325 MG tablet Take 325 mg by mouth daily.    . carvedilol (COREG) 12.5 MG tablet Take 1 tablet (12.5 mg total) by mouth 2 (two) times daily with a meal. 180 tablet 3  . ezetimibe (ZETIA) 10 MG tablet Take 1 tablet (10 mg total) by mouth daily. 90 tablet 3  . lisinopril (PRINIVIL,ZESTRIL) 40 MG tablet     . nitroGLYCERIN (NITROSTAT) 0.4 MG SL tablet Place 1 tablet (0.4 mg total) under the tongue every 5 (five) minutes as needed for chest pain. 30 tablet 6  . pioglitazone (ACTOS) 45 MG tablet Take 45 mg by mouth every morning.    . rosuvastatin (CRESTOR) 40 MG tablet Take 20 mg by mouth daily.      No current facility-administered medications on file prior to visit.    No Known Allergies   Objective: General: Patient is awake, alert, and oriented x 3 and in no acute distress.  Integument: Skin is warm, dry and supple bilateral. Nails  are tender, long, thickened and dystrophic with subungual debris, consistent with onychomycosis, 1-5 on right and 2-5 on left. No signs of infection. No open lesions, +  preulcerative lesions present bilateral sub 5 and sub 2 on left and sub 4 on right. Remaining integument unremarkable.  Vasculature:  Dorsalis Pedis pulse 2/4 bilateral. Posterior Tibial pulse  1/4 bilateral. Capillary fill time 4 sec. Scant hair growth to the level of the digits.Temperature gradient within normal limits. + varicosities present bilateral. No edema present bilateral.   Neurology: The patient has intact sensation measured with a 5.07/10g Semmes Weinstein Monofilament at all pedal sites bilateral. Vibratory sensation absent on left to level of ankle and diminished on right with tuning fork. No Babinski sign present bilateral.   Musculoskeletal: Bunion and hammertoe on right, Status post amputation of Left great toe, Muscular strength 5/5 in all lower extremity muscular groups bilateral without pain on range of motion . No tenderness with calf compression bilateral.  Assessment and Plan: Problem List Items Addressed This Visit      Cardiovascular and Mediastinum   Peripheral vascular disease, unspecified ([place])    Other Visit Diagnoses    Dermatophytosis of nail    -  Primary   Foot pain, bilateral       Toe amputation status, left (HCC)       Pre-ulcerative calluses       Type 2 diabetes, controlled, with neuropathy (  [place])         -Examined patient. -Discussed and educated patient on diabetic foot care, especially with regards to the vascular, neurological and musculoskeletal systems.  -Stressed the importance of good glycemic control and the detriment of not controlling glucose levels in relation to the foot. -Mechanically debrided pre-ulcerative callus x 3 using bur and all nails x9 bilateral using sterile nail nipper and filed with dremel without incident  -Patient to meet with Benjie Karvonen today to be molded for  diabetic shoes with custom insole with toe-filler for left -Answered all patient questions -Patient to return in 3 months for at risk foot care -Patient advised to call the office if any problems or questions arise in the meantime.  [name], DPM

## 2017-09-17 DIAGNOSIS — Z23 Encounter for immunization: Secondary | ICD-10-CM | POA: Diagnosis not present

## 2017-10-01 DIAGNOSIS — N183 Chronic kidney disease, stage 3 (moderate): Secondary | ICD-10-CM | POA: Diagnosis not present

## 2017-10-06 DIAGNOSIS — I129 Hypertensive chronic kidney disease with stage 1 through stage 4 chronic kidney disease, or unspecified chronic kidney disease: Secondary | ICD-10-CM | POA: Diagnosis not present

## 2017-10-06 DIAGNOSIS — N183 Chronic kidney disease, stage 3 (moderate): Secondary | ICD-10-CM | POA: Diagnosis not present

## 2017-10-06 DIAGNOSIS — E1122 Type 2 diabetes mellitus with diabetic chronic kidney disease: Secondary | ICD-10-CM | POA: Diagnosis not present

## 2017-10-06 DIAGNOSIS — E669 Obesity, unspecified: Secondary | ICD-10-CM | POA: Diagnosis not present

## 2017-11-05 DIAGNOSIS — J22 Unspecified acute lower respiratory infection: Secondary | ICD-10-CM | POA: Diagnosis not present

## 2017-11-18 DIAGNOSIS — J181 Lobar pneumonia, unspecified organism: Secondary | ICD-10-CM | POA: Diagnosis not present

## 2017-11-18 DIAGNOSIS — R9389 Abnormal findings on diagnostic imaging of other specified body structures: Secondary | ICD-10-CM | POA: Diagnosis not present

## 2017-11-18 DIAGNOSIS — R05 Cough: Secondary | ICD-10-CM | POA: Diagnosis not present

## 2017-11-18 DIAGNOSIS — Z951 Presence of aortocoronary bypass graft: Secondary | ICD-10-CM | POA: Diagnosis not present

## 2017-11-18 DIAGNOSIS — J22 Unspecified acute lower respiratory infection: Secondary | ICD-10-CM | POA: Diagnosis not present

## 2017-11-18 DIAGNOSIS — R0602 Shortness of breath: Secondary | ICD-10-CM | POA: Diagnosis not present

## 2017-11-20 ENCOUNTER — Encounter: Payer: Self-pay | Admitting: Sports Medicine

## 2017-11-20 ENCOUNTER — Ambulatory Visit (INDEPENDENT_AMBULATORY_CARE_PROVIDER_SITE_OTHER): Payer: Medicare Other

## 2017-11-20 ENCOUNTER — Ambulatory Visit (INDEPENDENT_AMBULATORY_CARE_PROVIDER_SITE_OTHER): Payer: Medicare Other | Admitting: Sports Medicine

## 2017-11-20 DIAGNOSIS — E119 Type 2 diabetes mellitus without complications: Secondary | ICD-10-CM | POA: Diagnosis not present

## 2017-11-20 DIAGNOSIS — L02612 Cutaneous abscess of left foot: Secondary | ICD-10-CM | POA: Diagnosis not present

## 2017-11-20 DIAGNOSIS — E1140 Type 2 diabetes mellitus with diabetic neuropathy, unspecified: Secondary | ICD-10-CM

## 2017-11-20 DIAGNOSIS — S90425A Blister (nonthermal), left lesser toe(s), initial encounter: Secondary | ICD-10-CM

## 2017-11-20 DIAGNOSIS — M79675 Pain in left toe(s): Secondary | ICD-10-CM

## 2017-11-20 DIAGNOSIS — I252 Old myocardial infarction: Secondary | ICD-10-CM | POA: Diagnosis not present

## 2017-11-20 DIAGNOSIS — Z79899 Other long term (current) drug therapy: Secondary | ICD-10-CM | POA: Diagnosis not present

## 2017-11-20 DIAGNOSIS — I2510 Atherosclerotic heart disease of native coronary artery without angina pectoris: Secondary | ICD-10-CM | POA: Diagnosis not present

## 2017-11-20 DIAGNOSIS — E785 Hyperlipidemia, unspecified: Secondary | ICD-10-CM | POA: Diagnosis not present

## 2017-11-20 DIAGNOSIS — L03032 Cellulitis of left toe: Secondary | ICD-10-CM

## 2017-11-20 DIAGNOSIS — M1990 Unspecified osteoarthritis, unspecified site: Secondary | ICD-10-CM | POA: Diagnosis not present

## 2017-11-20 DIAGNOSIS — B351 Tinea unguium: Secondary | ICD-10-CM

## 2017-11-20 DIAGNOSIS — L03116 Cellulitis of left lower limb: Secondary | ICD-10-CM | POA: Diagnosis not present

## 2017-11-20 DIAGNOSIS — I10 Essential (primary) hypertension: Secondary | ICD-10-CM | POA: Diagnosis not present

## 2017-11-20 DIAGNOSIS — Z7982 Long term (current) use of aspirin: Secondary | ICD-10-CM | POA: Diagnosis not present

## 2017-11-20 DIAGNOSIS — Z951 Presence of aortocoronary bypass graft: Secondary | ICD-10-CM | POA: Diagnosis not present

## 2017-11-20 DIAGNOSIS — I739 Peripheral vascular disease, unspecified: Secondary | ICD-10-CM | POA: Diagnosis not present

## 2017-11-20 DIAGNOSIS — L97912 Non-pressure chronic ulcer of unspecified part of right lower leg with fat layer exposed: Secondary | ICD-10-CM | POA: Diagnosis not present

## 2017-11-20 NOTE — Progress Notes (Signed)
Patient ID: ZAFAR DEBROSSE, male   DOB: 1940-12-09, 77 y.o.   MRN: [id number]  Subjective: [name] is a 77 y.o. male patient with history of type 2 diabetes who presents to office today complaining of  long, painful nails  while ambulating in shoes; unable to trim. Patient states that he is also concerned about his left foot being red swollen and painful and that his toes turning purple started a week ago does not recall any trauma or injury states that he went to his primary doctor last week however his primary doctor did not check his foot states that his blood sugar was not recorded this morning denies any nausea vomiting fever chills or any constitutional symptoms at this time.  Patient Active Problem List   Diagnosis Date Noted  . Hx of CABG 06/25/2017  . ATN (acute tubular necrosis) ([place]) 03/28/2016  . Hematuria 03/28/2016  . Hyperkalemia 03/28/2016  . Benign hypertension with CKD (chronic kidney disease) stage III ([place]) 03/13/2016  . Type 2 diabetes mellitus ([place]) 03/13/2016  . CAD (coronary artery disease) 12/14/2015  . Dyslipidemia 09/25/2015  . Impaired renal function 09/25/2015  . Peripheral vascular disease, unspecified ([place]) 09/02/2012   Current Outpatient Medications on File Prior to Visit  Medication Sig Dispense Refill  . aspirin EC 325 MG tablet Take 325 mg by mouth daily.    . carvedilol (COREG) 12.5 MG tablet Take 1 tablet (12.5 mg total) by mouth 2 (two) times daily with a meal. 180 tablet 3  . ezetimibe (ZETIA) 10 MG tablet Take 1 tablet (10 mg total) by mouth daily. 90 tablet 3  . lisinopril (PRINIVIL,ZESTRIL) 40 MG tablet     . nitroGLYCERIN (NITROSTAT) 0.4 MG SL tablet Place 1 tablet (0.4 mg total) under the tongue every 5 (five) minutes as needed for chest pain. 30 tablet 6  . pioglitazone (ACTOS) 45 MG tablet Take 45 mg by mouth every morning.    . rosuvastatin (CRESTOR) 40 MG tablet Take 20 mg by mouth daily.      No current facility-administered medications on  file prior to visit.    No Known Allergies   Objective: General: Patient is awake, alert, and oriented x 3 and in no acute distress.  Integument: Skin is warm, dry and supple bilateral. Nails are tender, long, thickened and dystrophic with subungual debris, consistent with onychomycosis, 1-5 on right and 2-5 on left.  There is significant warmth redness and cellulitis streaking up the left foot and lower extremity with blister encompassing the entire third toe of the left foot starting for worsening infection with interdigital maceration. Remaining integument unremarkable.  Vasculature:  Dorsalis Pedis pulse 1/4 bilateral. Posterior Tibial pulse  1/4 bilateral. Capillary fill time 4 sec. Scant hair growth to the level of the digits.Temperature gradient within normal limits. + varicosities present bilateral.  Focal edema present to the left forefoot with decreased temperature to the left third toe at the area where the significant hematoma has formed.  Neurology: The patient has diminished sensation measured with a 5.07/10g Semmes Weinstein Monofilament at all pedal sites bilateral. Vibratory sensation absent on left to level of ankle and diminished on right with tuning fork. No Babinski sign present bilateral.   Musculoskeletal: Mild tenderness palpation to the left foot at the area of cellulitis. Bunion and hammertoe on right, Status post amputation of Left great toe, Muscular strength 5/5 in all lower extremity muscular groups bilateral without pain on range of motion . No tenderness with calf  compression bilateral.  Assessment and Plan: Problem List Items Addressed This Visit      Cardiovascular and Mediastinum   Peripheral vascular disease, unspecified ([place])   Relevant Orders   DG Foot Complete Left    Other Visit Diagnoses    Cellulitis and abscess of toe of left foot    -  Primary   Type 2 diabetes, controlled, with neuropathy ([place])       Relevant Orders   DG Foot Complete Left    Toe pain, left       Relevant Orders   DG Foot Complete Left   Blister of toe of left foot, initial encounter       Dermatophytosis of nail         -Examined patient. -Discussed and educated patient on diabetic foot care, especially with regards to the vascular, neurological and musculoskeletal systems.  -Stressed the importance of good glycemic control and the detriment of not controlling glucose levels in relation to the foot. -Mechanically debrided all nails x9 bilateral using sterile nail nipper and filed with dremel without incident  -After sterile and aseptic prep evacuated hematoma at left third toe using a chisel blade and then dressed toe and in between the toes on the left foot with Betadine and dry dressing -Spoke with Dr. Loleta Books at [facility] for direct admission for cellulitis; patient to be admitted for IV antibiotics hopefully for observation until cellulitis improves patient also to be admitted for routine blood work and for consideration of ABIs PVRs in the setting of history of CAD and PAD and possible further imaging like MRI since at today's office visit x-rays were negative for any acute findings contributory to his soft tissue cellulitis and blister of toe -Answered all patient questions -Patient to return after discharge from hospital -Patient advised to call the office if any problems or questions arise in the meantime.  [name], DPM

## 2017-11-21 DIAGNOSIS — E119 Type 2 diabetes mellitus without complications: Secondary | ICD-10-CM | POA: Diagnosis not present

## 2017-11-21 DIAGNOSIS — I10 Essential (primary) hypertension: Secondary | ICD-10-CM | POA: Diagnosis not present

## 2017-11-21 DIAGNOSIS — I739 Peripheral vascular disease, unspecified: Secondary | ICD-10-CM | POA: Diagnosis not present

## 2017-11-21 DIAGNOSIS — L03116 Cellulitis of left lower limb: Secondary | ICD-10-CM | POA: Diagnosis not present

## 2017-11-21 DIAGNOSIS — E785 Hyperlipidemia, unspecified: Secondary | ICD-10-CM | POA: Diagnosis not present

## 2017-11-21 DIAGNOSIS — L03032 Cellulitis of left toe: Secondary | ICD-10-CM | POA: Diagnosis not present

## 2017-11-21 DIAGNOSIS — R600 Localized edema: Secondary | ICD-10-CM | POA: Diagnosis not present

## 2017-11-24 DIAGNOSIS — G4700 Insomnia, unspecified: Secondary | ICD-10-CM | POA: Diagnosis not present

## 2017-11-24 DIAGNOSIS — I1310 Hypertensive heart and chronic kidney disease without heart failure, with stage 1 through stage 4 chronic kidney disease, or unspecified chronic kidney disease: Secondary | ICD-10-CM | POA: Diagnosis not present

## 2017-11-24 DIAGNOSIS — E785 Hyperlipidemia, unspecified: Secondary | ICD-10-CM | POA: Diagnosis not present

## 2017-11-24 DIAGNOSIS — J181 Lobar pneumonia, unspecified organism: Secondary | ICD-10-CM | POA: Diagnosis not present

## 2017-11-24 DIAGNOSIS — Z79899 Other long term (current) drug therapy: Secondary | ICD-10-CM | POA: Diagnosis not present

## 2017-11-24 DIAGNOSIS — E1122 Type 2 diabetes mellitus with diabetic chronic kidney disease: Secondary | ICD-10-CM | POA: Diagnosis not present

## 2017-11-24 DIAGNOSIS — E559 Vitamin D deficiency, unspecified: Secondary | ICD-10-CM | POA: Diagnosis not present

## 2017-11-24 DIAGNOSIS — E1142 Type 2 diabetes mellitus with diabetic polyneuropathy: Secondary | ICD-10-CM | POA: Diagnosis not present

## 2017-11-24 DIAGNOSIS — N183 Chronic kidney disease, stage 3 (moderate): Secondary | ICD-10-CM | POA: Diagnosis not present

## 2017-11-25 ENCOUNTER — Encounter: Payer: Self-pay | Admitting: Sports Medicine

## 2017-11-26 ENCOUNTER — Other Ambulatory Visit: Payer: Self-pay

## 2017-11-26 ENCOUNTER — Encounter: Payer: Self-pay | Admitting: Sports Medicine

## 2017-11-26 ENCOUNTER — Ambulatory Visit (INDEPENDENT_AMBULATORY_CARE_PROVIDER_SITE_OTHER): Payer: Medicare Other | Admitting: Sports Medicine

## 2017-11-26 DIAGNOSIS — M79675 Pain in left toe(s): Secondary | ICD-10-CM

## 2017-11-26 DIAGNOSIS — I739 Peripheral vascular disease, unspecified: Secondary | ICD-10-CM | POA: Diagnosis not present

## 2017-11-26 DIAGNOSIS — S90425D Blister (nonthermal), left lesser toe(s), subsequent encounter: Secondary | ICD-10-CM

## 2017-11-26 DIAGNOSIS — L03116 Cellulitis of left lower limb: Secondary | ICD-10-CM | POA: Diagnosis not present

## 2017-11-26 DIAGNOSIS — Z9189 Other specified personal risk factors, not elsewhere classified: Secondary | ICD-10-CM | POA: Diagnosis not present

## 2017-11-26 DIAGNOSIS — L02612 Cutaneous abscess of left foot: Secondary | ICD-10-CM

## 2017-11-26 DIAGNOSIS — E11621 Type 2 diabetes mellitus with foot ulcer: Secondary | ICD-10-CM | POA: Diagnosis not present

## 2017-11-26 DIAGNOSIS — L97509 Non-pressure chronic ulcer of other part of unspecified foot with unspecified severity: Secondary | ICD-10-CM | POA: Diagnosis not present

## 2017-11-26 DIAGNOSIS — L03032 Cellulitis of left toe: Secondary | ICD-10-CM

## 2017-11-26 DIAGNOSIS — E1142 Type 2 diabetes mellitus with diabetic polyneuropathy: Secondary | ICD-10-CM | POA: Diagnosis not present

## 2017-11-26 DIAGNOSIS — E1140 Type 2 diabetes mellitus with diabetic neuropathy, unspecified: Secondary | ICD-10-CM

## 2017-11-26 DIAGNOSIS — E1151 Type 2 diabetes mellitus with diabetic peripheral angiopathy without gangrene: Secondary | ICD-10-CM | POA: Diagnosis not present

## 2017-11-26 NOTE — Progress Notes (Addendum)
Patient ID: NICKLOS GAXIOLA, male   DOB: February 05, 1941, 77 y.o.   MRN: [id number]  Subjective: TONATIUH MALLON is a 77 y.o. male patient with history of type 2 diabetes who presents to office today follow up on cellulitis on left foot; was admitted to hospital for 23 obs and given a few doses of vanco and zosyn and sent home on oral antibiotics with some improvement on friday, denies any nausea vomiting fever chills or any constitutional symptoms at this time.  Patient is assisted by wife this visit.  Patient Active Problem List   Diagnosis Date Noted  . Hx of CABG 06/25/2017  . ATN (acute tubular necrosis) ([place]) 03/28/2016  . Hematuria 03/28/2016  . Hyperkalemia 03/28/2016  . Benign hypertension with CKD (chronic kidney disease) stage III ([place]) 03/13/2016  . Type 2 diabetes mellitus ([place]) 03/13/2016  . CAD (coronary artery disease) 12/14/2015  . Dyslipidemia 09/25/2015  . Impaired renal function 09/25/2015  . Peripheral vascular disease, unspecified ([place]) 09/02/2012   Current Outpatient Medications on File Prior to Visit  Medication Sig Dispense Refill  . aspirin EC 325 MG tablet Take 325 mg by mouth daily.    . carvedilol (COREG) 12.5 MG tablet Take 1 tablet (12.5 mg total) by mouth 2 (two) times daily with a meal. 180 tablet 3  . ezetimibe (ZETIA) 10 MG tablet Take 1 tablet (10 mg total) by mouth daily. 90 tablet 3  . lisinopril (PRINIVIL,ZESTRIL) 40 MG tablet     . nitroGLYCERIN (NITROSTAT) 0.4 MG SL tablet Place 1 tablet (0.4 mg total) under the tongue every 5 (five) minutes as needed for chest pain. 30 tablet 6  . pioglitazone (ACTOS) 45 MG tablet Take 45 mg by mouth every morning.    . rosuvastatin (CRESTOR) 40 MG tablet Take 20 mg by mouth daily.      No current facility-administered medications on file prior to visit.    No Known Allergies   Objective: General: Patient is awake, alert, and oriented x 3 and in no acute distress.  Integument: Skin is warm, dry and supple bilateral.  Nails are short thickened and dystrophic with subungual debris, consistent with onychomycosis, 1-5 on right and 2-5 on left.  There is decreased warmth redness and cellulitis left foot and lower extremity with dry blood blister encompassing the entire third and fifth toe measuring approximately 2x2cm of the left foot from interdigital maceration. Remaining integument unremarkable.  Vasculature:  Dorsalis Pedis pulse 1/4 bilateral. Posterior Tibial pulse  1/4 bilateral. Capillary fill time 4 sec. Scant hair growth to the level of the digits.Temperature gradient within normal limits. + varicosities present bilateral.  Focal edema present to the left forefoot with decreased temperature to the left toes.   Neurology: The patient has diminished sensation measured with a 5.07/10g Semmes Weinstein Monofilament at all pedal sites bilateral. Vibratory sensation absent on left to level of ankle and diminished on right with tuning fork. No Babinski sign present bilateral.   Musculoskeletal: Minimal tenderness palpation to the left foot at the area of cellulitis. Bunion and hammertoe on right, Status post amputation of Left great toe, Muscular strength 5/5 in all lower extremity muscular groups bilateral without pain on range of motion . No tenderness with calf compression bilateral.    Assessment and Plan: Problem List Items Addressed This Visit      Cardiovascular and Mediastinum   Peripheral vascular disease, unspecified ([place])    Other Visit Diagnoses    Blister of toe of left foot,  subsequent encounter    -  Primary   Cellulitis and abscess of toe of left foot       Type 2 diabetes, controlled, with neuropathy (HCC)       Toe pain, left         -Examined patient and discussed concern for dry blood blisters to toes necrosing and become gangrene. -Discussed and educated patient on diabetic foot care, especially with regards to the vascular, neurological and musculoskeletal systems.  -Stressed the  importance of good glycemic control and the detriment of not controlling glucose levels in relation to the foot. -Dressed left foot with betadine and wife to do the same at home daily -Continue with PO Augmentin  -MRI negative for osteomyelitis or abscess on left  -Patient needs vascular follow up ABI on left 0.59; will contact Dr. Oneida Alar to see if he can see this patient ASAP for possible intervention -Answered all patient questions -Patient to return in 2 weeks for wound and cellulitis check  -Patient advised to call the office if any problems or questions arise in the meantime.  [name], DPM

## 2017-11-27 ENCOUNTER — Encounter: Payer: Self-pay | Admitting: Vascular Surgery

## 2017-11-27 ENCOUNTER — Ambulatory Visit (INDEPENDENT_AMBULATORY_CARE_PROVIDER_SITE_OTHER)
Admission: RE | Admit: 2017-11-27 | Discharge: 2017-11-27 | Disposition: A | Discharge status: Home / Self Care | Payer: Medicare Other | Source: Ambulatory Visit | Attending: Vascular Surgery | Admitting: Vascular Surgery

## 2017-11-27 ENCOUNTER — Other Ambulatory Visit: Payer: Self-pay | Admitting: *Deleted

## 2017-11-27 ENCOUNTER — Telehealth: Payer: Self-pay | Admitting: *Deleted

## 2017-11-27 ENCOUNTER — Ambulatory Visit (INDEPENDENT_AMBULATORY_CARE_PROVIDER_SITE_OTHER): Payer: Medicare Other | Admitting: Vascular Surgery

## 2017-11-27 VITALS — BP 153/79 | HR 82 | Resp 20 | Ht 68.0 in | Wt 201.0 lb

## 2017-11-27 DIAGNOSIS — I739 Peripheral vascular disease, unspecified: Secondary | ICD-10-CM

## 2017-11-27 NOTE — Telephone Encounter (Signed)
Patients wife called to cancel his follow up appointment because he is being hospitalized tomorrow for vascular surgery and amputation of effected toes.

## 2017-11-27 NOTE — H&P (View-Only) (Signed)
Referring Physician: [name], DPM  Patient name: [name] MRN: [id number] DOB: [birth year] Sex: male  REASON FOR CONSULT: gangrene left foot  HPI: [name] is a 77 y.o. male who is referred for evaluation of gangrene left foot.  Patient states he had fairly sudden onset of pain in his left foot about 2 weeks ago.  3-5 days ago toes 3 and 5 on the left foot turned black.  He was briefly admitted to [facility].  He was given antibiotics at that point.  He was then discharged.  He currently is on Augmentin for antibiotic coverage.  He has been on this for 4 days.  He had ABIs performed at [facility] which showed the left leg ABI had dropped from greater than 1 to 0.6.  Right ABI was 0.87.  He also had an MRI scan of the left foot which showed edema consistent with cellulitis.  He previously underwent a left superficial femoral to dorsalis pedis bypass with vein September 2006.  He underwent amputation of his left first toe as well.  He subsequently underwent a left superficial femoral artery endarterectomy with bovine pericardial patch in 2009.  He was last seen in our office in May 2018.  At that time he had no graft stenosis in his bypass.  ABI on the left was 1.03 right was 0.93 .  He has also had a prior left carotid endarterectomy in [place] 2000.  Other medical problems include coronary artery disease, diabetes, arthritis, hyperlipidemia, hypertension all of which have been currently controlled.  He is on aspirin and a statin.  Past Medical History:  Diagnosis Date  . Arthritis   . CAD (coronary artery disease)   . Carotid artery occlusion   . Diabetes mellitus Age 51  . Hyperlipidemia   . Hypertension   . Joint pain   . Peripheral vascular disease ([place])   . Ulcer    Past Surgical History:  Procedure Laterality Date  . AMPUTATION     left first toe  . APPENDECTOMY    . CAROTID ENDARTERECTOMY Left 2000   CE  . FOOT SURGERY    . PR VEIN BYPASS  GRAFT,AORTO-FEM-POP  07/2005   left femoral to dorsalis pedis artery bypass    Family History  Problem Relation Age of Onset  . Heart disease Father        Heart Disease before age 54  . Heart attack Father   . Cancer Mother        Bladder    SOCIAL HISTORY: Social History   Socioeconomic History  . Marital status: Married    Spouse name: Not on file  . Number of children: Not on file  . Years of education: Not on file  . Highest education level: Not on file  Social Needs  . Financial resource strain: Not on file  . Food insecurity - worry: Not on file  . Food insecurity - inability: Not on file  . Transportation needs - medical: Not on file  . Transportation needs - non-medical: Not on file  Occupational History  . Not on file  Tobacco Use  . Smoking status: Former Smoker    Last attempt to quit: 11/04/1978    Years since quitting: 39.0  . Smokeless tobacco: Never Used  Substance and Sexual Activity  . Alcohol use: No  . Drug use: No  . Sexual activity: Not on file  Other Topics Concern  . Not on file  Social  History Narrative  . Not on file    No Known Allergies  Current Outpatient Medications  Medication Sig Dispense Refill  . aspirin EC 325 MG tablet Take 325 mg by mouth daily.    . carvedilol (COREG) 12.5 MG tablet Take 1 tablet (12.5 mg total) by mouth 2 (two) times daily with a meal. 180 tablet 3  . ezetimibe (ZETIA) 10 MG tablet Take 1 tablet (10 mg total) by mouth daily. 90 tablet 3  . lisinopril (PRINIVIL,ZESTRIL) 40 MG tablet     . pioglitazone (ACTOS) 45 MG tablet Take 45 mg by mouth every morning.    . rosuvastatin (CRESTOR) 40 MG tablet Take 20 mg by mouth daily.     . nitroGLYCERIN (NITROSTAT) 0.4 MG SL tablet Place 1 tablet (0.4 mg total) under the tongue every 5 (five) minutes as needed for chest pain. 30 tablet 6   No current facility-administered medications for this visit.     ROS:   General:  No weight loss, Fever, chills  HEENT: No  recent headaches, no nasal bleeding, no visual changes, no sore throat  Neurologic: No dizziness, blackouts, seizures. No recent symptoms of stroke or mini- stroke. No recent episodes of slurred speech, or temporary blindness.  Cardiac: No recent episodes of chest pain/pressure, no shortness of breath at rest.  No shortness of breath with exertion.  Denies history of atrial fibrillation or irregular heartbeat  Vascular: No history of rest pain in feet.  No history of claudication.  + history of non-healing ulcer, No history of DVT   Pulmonary: No home oxygen, no productive cough, no hemoptysis,  No asthma or wheezing  Musculoskeletal:  [X]  Arthritis, [ ]  Low back pain,  [ ]  Joint pain  Hematologic:No history of hypercoagulable state.  No history of easy bleeding.  No history of anemia  Gastrointestinal: No hematochezia or melena,  No gastroesophageal reflux, no trouble swallowing  Urinary: [ ]  chronic Kidney disease, [ ]  on HD - [ ]  MWF or [ ]  TTHS, [ ]  Burning with urination, [ ]  Frequent urination, [ ]  Difficulty urinating;   Skin: No rashes  Psychological: No history of anxiety,  No history of depression   Physical Examination  Vitals:   11/27/17 0851 11/27/17 0853  BP: (!) 149/78 (!) 153/79  Pulse: 82   Resp: 20   SpO2: 95%   Weight: 201 lb (91.2 kg)   Height: 5\' 8"  (1.727 m)     Body mass index is 30.56 kg/m.  General:  Alert and oriented, no acute distress HEENT: Normal Neck: No bruit or JVD Pulmonary: Clear to auscultation bilaterally Cardiac: Regular Rate and Rhythm  Abdomen: Soft, non-tender, non-distended, no mass, obese Skin: No rash, dry gangrene toes 3 and 5 left foot Extremity Pulses:  2+ radial, brachial, femoral, dorsalis pedis, easily palpable graft pulse at the ankle level, absent posterior tibial pulses bilaterally Musculoskeletal: Well-healed left first toe amputation site.  Edema extending into the left mid forefoot Neurologic: Upper and lower  extremity motor 5/5 and symmetric  DATA:  Duplex ultrasound of the patient's bypass graft was performed today.  This showed the bypass graft was patent but potentially occlusion distal to the graft or at the distal anastomosis with soft thrombus within the dorsalis pedis artery.  I reviewed and interpreted the study.  ASSESSMENT: Gangrene left foot threatened limb patent bypass but possibility of occlusion of the outflow vessel or progression of diabetic vasculopathy.   PLAN: Aortogram with bilateral lower extremity runoff  tomorrow.  Possible intervention for his bypass graft with either angioplasty of the distal anastomosis or potentially thrombolysis.  Patient will definitely need amputation of toes 3 and 5 of the left foot.  He potentially could be faced with a transmetatarsal amputation.  He also understands he is at risk for major leg amputation.  Risk benefits possible complications and procedure details including but not limited to bleeding infection contrast reaction vessel injury limb loss were all discussed with the patient today.  He understands and agrees to proceed.  The arteriogram will be scheduled for tomorrow.  He will be admitted after the arteriogram to address the gangrene of his left foot during hospital admission.  Ruta Hinds, MD [facility] Office: [phone] Pager: [phone]

## 2017-11-27 NOTE — Telephone Encounter (Signed)
Dr. Cannon Kettle ordered 4 x 4 gauze, ace wrap, betadine and saline wound wash for S90.425D blisters encompassing left 3rd and 5th toes 2 x 2 x 0.1cm for daily dressing changes, faxed to [facility].

## 2017-11-27 NOTE — Telephone Encounter (Signed)
-----   Message from [place], [place] sent at 11/26/2017  9:42 AM EST ----- Regarding: [facility] wound dressing supplies  4x4, kerlix, ace wrap, betadine and saline wound wash Blisters encompassing 3 and 5 toes on left measures 2x2cm

## 2017-11-27 NOTE — Progress Notes (Signed)
Referring Physician: [name], DPM  Patient name: [name] MRN: [id number] DOB: June 28, 1941 Sex: male  REASON FOR CONSULT: gangrene left foot  HPI: [name] is a 77 y.o. male who is referred for evaluation of gangrene left foot.  Patient states he had fairly sudden onset of pain in his left foot about 2 weeks ago.  3-5 days ago toes 3 and 5 on the left foot turned black.  He was briefly admitted to [facility].  He was given antibiotics at that point.  He was then discharged.  He currently is on Augmentin for antibiotic coverage.  He has been on this for 4 days.  He had ABIs performed at [facility] which showed the left leg ABI had dropped from greater than 1 to 0.6.  Right ABI was 0.87.  He also had an MRI scan of the left foot which showed edema consistent with cellulitis.  He previously underwent a left superficial femoral to dorsalis pedis bypass with vein September 2006.  He underwent amputation of his left first toe as well.  He subsequently underwent a left superficial femoral artery endarterectomy with bovine pericardial patch in 2009.  He was last seen in our office in May 2018.  At that time he had no graft stenosis in his bypass.  ABI on the left was 1.03 right was 0.93 .  He has also had a prior left carotid endarterectomy in [place] 2000.  Other medical problems include coronary artery disease, diabetes, arthritis, hyperlipidemia, hypertension all of which have been currently controlled.  He is on aspirin and a statin.  Past Medical History:  Diagnosis Date  . Arthritis   . CAD (coronary artery disease)   . Carotid artery occlusion   . Diabetes mellitus Age 51  . Hyperlipidemia   . Hypertension   . Joint pain   . Peripheral vascular disease ([place])   . Ulcer    Past Surgical History:  Procedure Laterality Date  . AMPUTATION     left first toe  . APPENDECTOMY    . CAROTID ENDARTERECTOMY Left 2000   CE  . FOOT SURGERY    . PR VEIN BYPASS  GRAFT,AORTO-FEM-POP  07/2005   left femoral to dorsalis pedis artery bypass    Family History  Problem Relation Age of Onset  . Heart disease Father        Heart Disease before age 7  . Heart attack Father   . Cancer Mother        Bladder    SOCIAL HISTORY: Social History   Socioeconomic History  . Marital status: Married    Spouse name: Not on file  . Number of children: Not on file  . Years of education: Not on file  . Highest education level: Not on file  Social Needs  . Financial resource strain: Not on file  . Food insecurity - worry: Not on file  . Food insecurity - inability: Not on file  . Transportation needs - medical: Not on file  . Transportation needs - non-medical: Not on file  Occupational History  . Not on file  Tobacco Use  . Smoking status: Former Smoker    Last attempt to quit: 11/04/1978    Years since quitting: 39.0  . Smokeless tobacco: Never Used  Substance and Sexual Activity  . Alcohol use: No  . Drug use: No  . Sexual activity: Not on file  Other Topics Concern  . Not on file  Social  History Narrative  . Not on file    No Known Allergies  Current Outpatient Medications  Medication Sig Dispense Refill  . aspirin EC 325 MG tablet Take 325 mg by mouth daily.    . carvedilol (COREG) 12.5 MG tablet Take 1 tablet (12.5 mg total) by mouth 2 (two) times daily with a meal. 180 tablet 3  . ezetimibe (ZETIA) 10 MG tablet Take 1 tablet (10 mg total) by mouth daily. 90 tablet 3  . lisinopril (PRINIVIL,ZESTRIL) 40 MG tablet     . pioglitazone (ACTOS) 45 MG tablet Take 45 mg by mouth every morning.    . rosuvastatin (CRESTOR) 40 MG tablet Take 20 mg by mouth daily.     . nitroGLYCERIN (NITROSTAT) 0.4 MG SL tablet Place 1 tablet (0.4 mg total) under the tongue every 5 (five) minutes as needed for chest pain. 30 tablet 6   No current facility-administered medications for this visit.     ROS:   General:  No weight loss, Fever, chills  HEENT: No  recent headaches, no nasal bleeding, no visual changes, no sore throat  Neurologic: No dizziness, blackouts, seizures. No recent symptoms of stroke or mini- stroke. No recent episodes of slurred speech, or temporary blindness.  Cardiac: No recent episodes of chest pain/pressure, no shortness of breath at rest.  No shortness of breath with exertion.  Denies history of atrial fibrillation or irregular heartbeat  Vascular: No history of rest pain in feet.  No history of claudication.  + history of non-healing ulcer, No history of DVT   Pulmonary: No home oxygen, no productive cough, no hemoptysis,  No asthma or wheezing  Musculoskeletal:  [X]  Arthritis, [ ]  Low back pain,  [ ]  Joint pain  Hematologic:No history of hypercoagulable state.  No history of easy bleeding.  No history of anemia  Gastrointestinal: No hematochezia or melena,  No gastroesophageal reflux, no trouble swallowing  Urinary: [ ]  chronic Kidney disease, [ ]  on HD - [ ]  MWF or [ ]  TTHS, [ ]  Burning with urination, [ ]  Frequent urination, [ ]  Difficulty urinating;   Skin: No rashes  Psychological: No history of anxiety,  No history of depression   Physical Examination  Vitals:   11/27/17 0851 11/27/17 0853  BP: (!) 149/78 (!) 153/79  Pulse: 82   Resp: 20   SpO2: 95%   Weight: 201 lb (91.2 kg)   Height: 5\' 8"  (1.727 m)     Body mass index is 30.56 kg/m.  General:  Alert and oriented, no acute distress HEENT: Normal Neck: No bruit or JVD Pulmonary: Clear to auscultation bilaterally Cardiac: Regular Rate and Rhythm  Abdomen: Soft, non-tender, non-distended, no mass, obese Skin: No rash, dry gangrene toes 3 and 5 left foot Extremity Pulses:  2+ radial, brachial, femoral, dorsalis pedis, easily palpable graft pulse at the ankle level, absent posterior tibial pulses bilaterally Musculoskeletal: Well-healed left first toe amputation site.  Edema extending into the left mid forefoot Neurologic: Upper and lower  extremity motor 5/5 and symmetric  DATA:  Duplex ultrasound of the patient's bypass graft was performed today.  This showed the bypass graft was patent but potentially occlusion distal to the graft or at the distal anastomosis with soft thrombus within the dorsalis pedis artery.  I reviewed and interpreted the study.  ASSESSMENT: Gangrene left foot threatened limb patent bypass but possibility of occlusion of the outflow vessel or progression of diabetic vasculopathy.   PLAN: Aortogram with bilateral lower extremity runoff  tomorrow.  Possible intervention for his bypass graft with either angioplasty of the distal anastomosis or potentially thrombolysis.  Patient will definitely need amputation of toes 3 and 5 of the left foot.  He potentially could be faced with a transmetatarsal amputation.  He also understands he is at risk for major leg amputation.  Risk benefits possible complications and procedure details including but not limited to bleeding infection contrast reaction vessel injury limb loss were all discussed with the patient today.  He understands and agrees to proceed.  The arteriogram will be scheduled for tomorrow.  He will be admitted after the arteriogram to address the gangrene of his left foot during hospital admission.  Ruta Hinds, MD [facility] [place] Office: [phone] Pager: [phone]

## 2017-11-28 ENCOUNTER — Encounter (HOSPITAL_COMMUNITY): Payer: Self-pay

## 2017-11-28 ENCOUNTER — Encounter (HOSPITAL_COMMUNITY)
Admission: RE | Disposition: A | Discharge status: Home Health | Payer: Self-pay | Source: Ambulatory Visit | Attending: Vascular Surgery

## 2017-11-28 ENCOUNTER — Inpatient Hospital Stay (HOSPITAL_COMMUNITY)
Admission: RE | Admit: 2017-11-28 | Discharge: 2017-12-03 | DRG: 241 | Disposition: A | Discharge status: Home Health | Payer: Medicare Other | Source: Ambulatory Visit | Attending: Vascular Surgery | Admitting: Vascular Surgery

## 2017-11-28 ENCOUNTER — Other Ambulatory Visit: Payer: Self-pay

## 2017-11-28 DIAGNOSIS — I129 Hypertensive chronic kidney disease with stage 1 through stage 4 chronic kidney disease, or unspecified chronic kidney disease: Secondary | ICD-10-CM | POA: Diagnosis present

## 2017-11-28 DIAGNOSIS — E1152 Type 2 diabetes mellitus with diabetic peripheral angiopathy with gangrene: Principal | ICD-10-CM | POA: Diagnosis present

## 2017-11-28 DIAGNOSIS — I2510 Atherosclerotic heart disease of native coronary artery without angina pectoris: Secondary | ICD-10-CM | POA: Diagnosis present

## 2017-11-28 DIAGNOSIS — M79672 Pain in left foot: Secondary | ICD-10-CM | POA: Diagnosis not present

## 2017-11-28 DIAGNOSIS — Z8249 Family history of ischemic heart disease and other diseases of the circulatory system: Secondary | ICD-10-CM

## 2017-11-28 DIAGNOSIS — E1122 Type 2 diabetes mellitus with diabetic chronic kidney disease: Secondary | ICD-10-CM | POA: Diagnosis present

## 2017-11-28 DIAGNOSIS — Z951 Presence of aortocoronary bypass graft: Secondary | ICD-10-CM

## 2017-11-28 DIAGNOSIS — Z87891 Personal history of nicotine dependence: Secondary | ICD-10-CM | POA: Diagnosis not present

## 2017-11-28 DIAGNOSIS — I96 Gangrene, not elsewhere classified: Secondary | ICD-10-CM | POA: Diagnosis present

## 2017-11-28 DIAGNOSIS — R05 Cough: Secondary | ICD-10-CM | POA: Diagnosis not present

## 2017-11-28 DIAGNOSIS — R319 Hematuria, unspecified: Secondary | ICD-10-CM | POA: Diagnosis not present

## 2017-11-28 DIAGNOSIS — I25810 Atherosclerosis of coronary artery bypass graft(s) without angina pectoris: Secondary | ICD-10-CM | POA: Diagnosis not present

## 2017-11-28 DIAGNOSIS — N183 Chronic kidney disease, stage 3 (moderate): Secondary | ICD-10-CM | POA: Diagnosis present

## 2017-11-28 DIAGNOSIS — Z7982 Long term (current) use of aspirin: Secondary | ICD-10-CM

## 2017-11-28 DIAGNOSIS — J189 Pneumonia, unspecified organism: Secondary | ICD-10-CM

## 2017-11-28 DIAGNOSIS — E785 Hyperlipidemia, unspecified: Secondary | ICD-10-CM | POA: Diagnosis present

## 2017-11-28 HISTORY — PX: ABDOMINAL AORTOGRAM W/LOWER EXTREMITY: CATH118223

## 2017-11-28 LAB — GLUCOSE, CAPILLARY
Glucose-Capillary: 157 mg/dL — ABNORMAL HIGH (ref 65–99)
Glucose-Capillary: 205 mg/dL — ABNORMAL HIGH (ref 65–99)

## 2017-11-28 LAB — POTASSIUM: Potassium: 4.5 mmol/L (ref 3.5–5.1)

## 2017-11-28 SURGERY — ABDOMINAL AORTOGRAM W/LOWER EXTREMITY
Anesthesia: LOCAL

## 2017-11-28 MED ORDER — EZETIMIBE 10 MG PO TABS
10.0000 mg | ORAL_TABLET | Freq: Every day | ORAL | Status: DC
  Administered 2017-11-29 – 2017-12-03 (×5): 10 mg via ORAL
  Filled 2017-11-28 (×6): qty 1

## 2017-11-28 MED ORDER — SODIUM CHLORIDE 0.9% FLUSH: 3.0000 mL | INTRAVENOUS | Status: DC | PRN

## 2017-11-28 MED ORDER — VITAMIN E 180 MG (400 UNIT) PO CAPS
400.0000 [IU] | ORAL_CAPSULE | Freq: Every day | ORAL | Status: DC
  Administered 2017-11-29 – 2017-12-03 (×5): 400 [IU] via ORAL
  Filled 2017-11-28 (×6): qty 1

## 2017-11-28 MED ORDER — ACETAMINOPHEN 325 MG PO TABS
650.0000 mg | ORAL_TABLET | ORAL | Status: DC | PRN
  Administered 2017-12-02: 650 mg via ORAL
  Filled 2017-11-28: qty 2

## 2017-11-28 MED ORDER — ONDANSETRON HCL 4 MG/2ML IJ SOLN
4.0000 mg | Freq: Four times a day (QID) | INTRAMUSCULAR | Status: DC | PRN
  Administered 2017-12-01 – 2017-12-02 (×3): 4 mg via INTRAVENOUS
  Filled 2017-11-28 (×2): qty 2

## 2017-11-28 MED ORDER — SODIUM CHLORIDE 0.9% FLUSH
3.0000 mL | Freq: Two times a day (BID) | INTRAVENOUS | Status: DC
  Administered 2017-11-28 – 2017-12-02 (×5): 3 mL via INTRAVENOUS

## 2017-11-28 MED ORDER — PIPERACILLIN-TAZOBACTAM 3.375 G IVPB
3.3750 g | Freq: Three times a day (TID) | INTRAVENOUS | Status: DC
  Administered 2017-11-28 – 2017-11-30 (×6): 3.375 g via INTRAVENOUS
  Filled 2017-11-28 (×8): qty 50

## 2017-11-28 MED ORDER — FENTANYL CITRATE (PF) 100 MCG/2ML IJ SOLN
INTRAMUSCULAR | Status: DC | PRN
  Administered 2017-11-28: 25 ug via INTRAVENOUS

## 2017-11-28 MED ORDER — CARVEDILOL 12.5 MG PO TABS
12.5000 mg | ORAL_TABLET | Freq: Two times a day (BID) | ORAL | Status: DC
  Administered 2017-11-29 – 2017-12-03 (×9): 12.5 mg via ORAL
  Filled 2017-11-28 (×9): qty 1

## 2017-11-28 MED ORDER — SODIUM CHLORIDE 0.9 % IV SOLN
250.0000 mL | INTRAVENOUS | Status: DC | PRN
Start: 2017-11-28 — End: 2017-11-30
  Administered 2017-11-28: 22:00:00 via INTRAVENOUS

## 2017-11-28 MED ORDER — HEPARIN (PORCINE) IN NACL 2-0.9 UNIT/ML-% IJ SOLN
INTRAMUSCULAR | Status: AC
  Filled 2017-11-28: qty 1000

## 2017-11-28 MED ORDER — ASPIRIN EC 325 MG PO TBEC
325.0000 mg | DELAYED_RELEASE_TABLET | Freq: Every day | ORAL | Status: DC
  Administered 2017-11-28 – 2017-12-03 (×5): 325 mg via ORAL
  Filled 2017-11-28 (×5): qty 1

## 2017-11-28 MED ORDER — LIDOCAINE HCL 1 % IJ SOLN
INTRAMUSCULAR | Status: AC
  Filled 2017-11-28: qty 20

## 2017-11-28 MED ORDER — ROSUVASTATIN CALCIUM 10 MG PO TABS
20.0000 mg | ORAL_TABLET | Freq: Every day | ORAL | Status: DC
  Administered 2017-11-29 – 2017-12-02 (×4): 20 mg via ORAL
  Filled 2017-11-28 (×4): qty 2

## 2017-11-28 MED ORDER — LINAGLIPTIN 5 MG PO TABS
5.0000 mg | ORAL_TABLET | Freq: Every day | ORAL | Status: DC
  Administered 2017-11-28 – 2017-12-03 (×6): 5 mg via ORAL
  Filled 2017-11-28 (×6): qty 1

## 2017-11-28 MED ORDER — FENTANYL CITRATE (PF) 100 MCG/2ML IJ SOLN
INTRAMUSCULAR | Status: AC
  Filled 2017-11-28: qty 2

## 2017-11-28 MED ORDER — SODIUM CHLORIDE 0.9 % IV SOLN: INTRAVENOUS | Status: AC

## 2017-11-28 MED ORDER — SODIUM CHLORIDE 0.9 % IV SOLN
INTRAVENOUS | Status: DC
  Administered 2017-11-28: 08:00:00 via INTRAVENOUS

## 2017-11-28 MED ORDER — PIOGLITAZONE HCL 45 MG PO TABS
45.0000 mg | ORAL_TABLET | Freq: Every morning | ORAL | Status: DC
  Administered 2017-11-29 – 2017-12-03 (×5): 45 mg via ORAL
  Filled 2017-11-28 (×6): qty 1

## 2017-11-28 MED ORDER — MIDAZOLAM HCL 2 MG/2ML IJ SOLN
INTRAMUSCULAR | Status: AC
  Filled 2017-11-28: qty 2

## 2017-11-28 MED ORDER — LIDOCAINE HCL (PF) 1 % IJ SOLN
INTRAMUSCULAR | Status: DC | PRN
  Administered 2017-11-28: 5 mL

## 2017-11-28 MED ORDER — LABETALOL HCL 5 MG/ML IV SOLN: 10.0000 mg | INTRAVENOUS | Status: DC | PRN

## 2017-11-28 MED ORDER — HYDRALAZINE HCL 20 MG/ML IJ SOLN: 5.0000 mg | INTRAMUSCULAR | Status: DC | PRN

## 2017-11-28 MED ORDER — OXYCODONE HCL 5 MG PO TABS
5.0000 mg | ORAL_TABLET | ORAL | Status: DC | PRN
  Administered 2017-11-28 – 2017-11-30 (×2): 5 mg via ORAL
  Administered 2017-12-01 – 2017-12-03 (×4): 10 mg via ORAL
  Filled 2017-11-28: qty 1
  Filled 2017-11-28: qty 2
  Filled 2017-11-28: qty 1
  Filled 2017-11-28 (×3): qty 2

## 2017-11-28 MED ORDER — FENOFIBRATE 160 MG PO TABS
160.0000 mg | ORAL_TABLET | Freq: Every day | ORAL | Status: DC
  Administered 2017-11-29 – 2017-12-03 (×5): 160 mg via ORAL
  Filled 2017-11-28 (×6): qty 1

## 2017-11-28 MED ORDER — VANCOMYCIN HCL IN DEXTROSE 1-5 GM/200ML-% IV SOLN
1000.0000 mg | Freq: Once | INTRAVENOUS | Status: AC
  Administered 2017-11-28: 1000 mg via INTRAVENOUS
  Filled 2017-11-28: qty 200

## 2017-11-28 MED ORDER — GUAIFENESIN-CODEINE 100-10 MG/5ML PO SOLN
ORAL | Status: AC
  Filled 2017-11-28: qty 10

## 2017-11-28 MED ORDER — PANTOPRAZOLE SODIUM 40 MG PO TBEC
40.0000 mg | DELAYED_RELEASE_TABLET | Freq: Every day | ORAL | Status: DC
  Administered 2017-11-29 – 2017-12-03 (×5): 40 mg via ORAL
  Filled 2017-11-28 (×5): qty 1

## 2017-11-28 MED ORDER — MORPHINE SULFATE (PF) 4 MG/ML IV SOLN
2.0000 mg | INTRAVENOUS | Status: DC | PRN
  Administered 2017-12-01 – 2017-12-02 (×2): 2 mg via INTRAVENOUS
  Filled 2017-11-28 (×2): qty 1

## 2017-11-28 MED ORDER — MIDAZOLAM HCL 2 MG/2ML IJ SOLN
INTRAMUSCULAR | Status: DC | PRN
  Administered 2017-11-28: 1 mg via INTRAVENOUS

## 2017-11-28 MED ORDER — HEPARIN (PORCINE) IN NACL 2-0.9 UNIT/ML-% IJ SOLN
INTRAMUSCULAR | Status: AC | PRN
  Administered 2017-11-28: 1000 mL via INTRA_ARTERIAL

## 2017-11-28 MED ORDER — IODIXANOL 320 MG/ML IV SOLN
INTRAVENOUS | Status: DC | PRN
  Administered 2017-11-28: 93 mL via INTRA_ARTERIAL

## 2017-11-28 MED ORDER — LISINOPRIL 10 MG PO TABS
20.0000 mg | ORAL_TABLET | Freq: Every day | ORAL | Status: DC
  Administered 2017-11-28 – 2017-11-29 (×2): 20 mg via ORAL
  Filled 2017-11-28: qty 2
  Filled 2017-11-28: qty 1

## 2017-11-28 MED ORDER — VITAMIN D (ERGOCALCIFEROL) 1.25 MG (50000 UNIT) PO CAPS
50000.0000 [IU] | ORAL_CAPSULE | ORAL | Status: DC
  Administered 2017-12-01: 50000 [IU] via ORAL
  Filled 2017-11-28: qty 1

## 2017-11-28 MED ORDER — GUAIFENESIN-CODEINE 100-10 MG/5ML PO SOLN
10.0000 mL | ORAL | Status: AC | PRN
  Administered 2017-11-28 (×2): 10 mL via ORAL
  Filled 2017-11-28: qty 10

## 2017-11-28 SURGICAL SUPPLY — 9 items
CATH ANGIO 5F PIGTAIL 65CM (CATHETERS) ×1 IMPLANT
COVER PRB 48X5XTLSCP FOLD TPE (BAG) IMPLANT
COVER PROBE 5X48 (BAG) ×2
KIT PV (KITS) ×2 IMPLANT
SHEATH PINNACLE 5F 10CM (SHEATH) ×2 IMPLANT
SYR MEDRAD MARK V 150ML (SYRINGE) ×2 IMPLANT
TRANSDUCER W/STOPCOCK (MISCELLANEOUS) ×2 IMPLANT
TRAY PV CATH (CUSTOM PROCEDURE TRAY) ×2 IMPLANT
WIRE BENTSON .035X145CM (WIRE) ×1 IMPLANT

## 2017-11-28 NOTE — Progress Notes (Addendum)
Site area: RFA Site Prior to Removal:  Level 0 Pressure Applied For:`25 min Manual:   yes Patient Status During Pull:  stable Post Pull Site:  Level 0 Post Pull Instructions Given:  yes Post Pull Pulses Present: doppler Dressing Applied:  tegaderm Bedrest begins @ 9914 till 1615 Comments:

## 2017-11-28 NOTE — Progress Notes (Signed)
istat lab value high 7.8, lab drew K+, called main lab for result 1.5 hours later for result, they will call back, unable to find the vial lab drew and sent themselves.

## 2017-11-28 NOTE — Interval H&P Note (Signed)
History and Physical Interval Note:  11/28/2017 9:26 AM  [name]  has presented today for surgery, with the diagnosis of PVD w/ ulcers  The various methods of treatment have been discussed with the patient and family. After consideration of risks, benefits and other options for treatment, the patient has consented to  Procedure(s): ABDOMINAL AORTOGRAM W/LOWER EXTREMITY (N/A) as a surgical intervention .  The patient's history has been reviewed, patient examined, no change in status, stable for surgery.  I have reviewed the patient's chart and labs.  Questions were answered to the patient's satisfaction.     Ruta Hinds

## 2017-11-28 NOTE — Op Note (Addendum)
Procedure: Ultrasound right groin abdominal aortogram with bilateral lower extremity runoff  Preoperative diagnosis: Gangrene left foot  Postoperative diagnosis: Same  Anesthesia: Local with IV sedation  Operative findings: #1 patent left superficial femoral to dorsalis pedis artery bypass but occluded distal outflow  2.  Multilevel right superficial femoral artery stenosis with one-vessel peroneal runoff to the right foot  Operative details: After obtaining informed consent, the patient was taken the PV lab.  The patient was placed in supine position Angio table.  Both groins were prepped and draped in usual sterile fashion.  Local anesthesia was inflated over the right common femoral artery.  An introducer needle was used to cannulate the right common femoral artery under ultrasound guidance.  An 3 Bentson wire was then advanced into the abdominal aorta under fluoroscopic guidance.  A 5 French sheath was placed over the guidewire in the right common femoral artery.  This was thoroughly flushed with heparinized saline.  A 5 French pigtail catheter was then advanced over the guidewire into the abdominal aorta and abdominal aortogram was obtained in AP projection.  The left and right renal arteries are patent.  The infrarenal abdominal aorta is patent.  The left and right common external and internal iliac arteries are widely patent.  Next the pectoral catheter was pulled down just above the aortic bifurcation and bilateral lower extremity runoff views were obtained.  In the right lower extremity, the right common femoral profunda femoris is patent.  The right superficial femoral artery is patent but there are multiple areas of 50-75% stenosis in the distal portion.  The popliteal artery also has similar findings.  The below-knee popliteal artery is patent.  There is one vessel runoff to the right foot via the peroneal artery.  The anterior tibial and posterior tibial arteries are occluded.  In  the left lower extremity, the left common femoral and profunda femoris is patent.  The left superficial femoral artery is patent to the mid leg.  There is a vein bypass that originates off of the distal left superficial femoral artery which extends all the way to the dorsalis pedis artery.  There is retrograde flow up the dorsalis pedis artery.  However there is no antegrade flow as the dorsalis pedis artery has become totally obliterated.  There is minimal digital artery flow out into the foot.  There is no plantar arch.  At this point the pigtail catheter was removed over a guidewire.  A 5 French sheath was left in place to be pulled in the holding area.  The patient tolerated procedure well and there were no complications.  The patient was taken to the holding area in stable condition.  Operative management: The patient will be scheduled for a left transmetatarsal amputation early next week.  If this fails to heal he will need either a below-knee or potentially above-knee amputation.  Ruta Hinds, MD [facility] [place] Office: [phone] Pager: [phone]

## 2017-11-28 NOTE — Telephone Encounter (Signed)
Thanks

## 2017-11-28 NOTE — Progress Notes (Signed)
Raised, "puffy" area around insertion site post persistant cough. Pressure held. See MAR. Will observe.

## 2017-11-29 ENCOUNTER — Inpatient Hospital Stay (HOSPITAL_COMMUNITY): Payer: Medicare Other

## 2017-11-29 LAB — GLUCOSE, CAPILLARY
GLUCOSE-CAPILLARY: 169 mg/dL — AB (ref 65–99)
Glucose-Capillary: 142 mg/dL — ABNORMAL HIGH (ref 65–99)
Glucose-Capillary: 157 mg/dL — ABNORMAL HIGH (ref 65–99)
Glucose-Capillary: 182 mg/dL — ABNORMAL HIGH (ref 65–99)

## 2017-11-29 MED ORDER — HEPARIN SODIUM (PORCINE) 5000 UNIT/ML IJ SOLN
5000.0000 [IU] | Freq: Three times a day (TID) | INTRAMUSCULAR | Status: DC
  Administered 2017-11-29 – 2017-12-03 (×10): 5000 [IU] via SUBCUTANEOUS
  Filled 2017-11-29 (×10): qty 1

## 2017-11-29 MED ORDER — GLUCERNA SHAKE PO LIQD
237.0000 mL | Freq: Three times a day (TID) | ORAL | Status: DC
  Administered 2017-12-02 (×2): 237 mL via ORAL

## 2017-11-29 MED ORDER — INSULIN ASPART 100 UNIT/ML ~~LOC~~ SOLN
0.0000 [IU] | Freq: Three times a day (TID) | SUBCUTANEOUS | Status: DC
  Administered 2017-11-29 – 2017-11-30 (×4): 3 [IU] via SUBCUTANEOUS
  Administered 2017-12-01: 2 [IU] via SUBCUTANEOUS
  Administered 2017-12-02 – 2017-12-03 (×4): 3 [IU] via SUBCUTANEOUS

## 2017-11-29 NOTE — Progress Notes (Addendum)
  Progress Note    11/29/2017 8:34 AM 1 Day Post-Op  Subjective:  No complaints  Afebrile HR 70's-80's NSR 573'U-202'R systolic 42% RA  Vitals:   11/28/17 2014 11/29/17 0356  BP: (!) 121/58 (!) 143/65  Pulse: 76 76  Resp: 17 20  Temp: 98.2 F (36.8 C) 98.3 F (36.8 C)  SpO2: 94% 96%    Physical Exam: Lungs:  Non labored Incisions:  Right groin is soft without hematoma; mild ecchymosis right lateral thigh/hip Extremities:  Bandage in place and dry left foot   CBC    Component Value Date/Time   WBC 8.0 09/02/2008 0548   RBC 4.13 (L) 09/02/2008 0548   HGB 13.9 11/28/2017 0826   HCT 41.0 11/28/2017 0826   PLT 172 09/02/2008 0548   MCV 88.5 09/02/2008 0548   MCHC 34.0 09/02/2008 0548   RDW 13.8 09/02/2008 0548    BMET    Component Value Date/Time   NA 137 11/28/2017 0826   K 4.5 11/28/2017 0837   CL 107 11/28/2017 0826   CO2 29 09/02/2008 0548   GLUCOSE 210 (H) 11/28/2017 0826   BUN 39 (H) 11/28/2017 0826   CREATININE 1.60 (H) 11/28/2017 0826   CALCIUM 8.6 09/02/2008 0548   GFRNONAA >60 09/02/2008 0548   GFRAA  09/02/2008 0548    >60        The eGFR has been calculated using the MDRD equation. This calculation has not been validated in all clinical    INR    Component Value Date/Time   INR 1.0 08/26/2008 1454     Intake/Output Summary (Last 24 hours) at 11/29/2017 0834 Last data filed at 11/29/2017 0359 Gross per 24 hour  Intake -  Output 575 ml  Net -575 ml     Assessment:  77 y.o. male is s/p:  Ultrasound right groin abdominal aortogram with bilateral lower extremity runoff  1 Day Post-Op  Plan: -pt doing well this morning -right groin is soft without hematoma-mild ecchymosis lateral thigh/hip -scheduled for left TMA Monday -diabetic-will order SSI -check labs in am -continue IV abx -DVT prophylaxis:  Start SQ heparin   Leontine Locket, PA-C [facility] [phone] 11/29/2017 8:34 AM  No problems  overnight Check baseline chest xray tomorrow To OR Monday for [place], MD [facility] [place] Office: [phone] Pager: [phone]

## 2017-11-29 NOTE — Progress Notes (Signed)
Nutrition Brief Note  Patient identified on the Malnutrition Screening Tool (MST) Report. Patient had reported unintentional weight loss and poor intake due to decreased appetite.   Wt Readings from Last 15 Encounters:  11/28/17 201 lb (91.2 kg)  11/27/17 201 lb (91.2 kg)  06/25/17 211 lb (95.7 kg)  03/20/17 211 lb 3 oz (95.8 kg)  03/15/16 202 lb 9.6 oz (91.9 kg)  10/06/14 183 lb (83 kg)  10/15/13 201 lb (91.2 kg)  09/02/12 200 lb (90.7 kg)  08/22/11 196 lb (88.9 kg)   Body mass index is 29.68 kg/m. Patient meets criteria for overweight based on current BMI.   Contrary to MST, patient reports he has been eating fine. He denies any n/v/c/d or any barriers to intake.   Weight wise, he says he had gotten up to 215 lbs. He then says he has been "trying to lose some weight".   He currently has no complaints at all. He declined any food requests; "If I want something I can just call the kitchen".   He is going for transmetatarsal amputation on Monday. He was agreeable to supplements post op to help with wound healing.   Current diet order is carb modified, patient is consuming approximately 100% of meals at this time.   No nutrition interventions warranted at this time. If nutrition issues arise, please consult RD.   Burtis Junes RD, LDN, CNSC Clinical Nutrition Pager: [phone] 11/29/2017 4:17 PM

## 2017-11-30 ENCOUNTER — Encounter (HOSPITAL_COMMUNITY): Payer: Self-pay | Admitting: Family

## 2017-11-30 LAB — GLUCOSE, CAPILLARY
GLUCOSE-CAPILLARY: 155 mg/dL — AB (ref 65–99)
GLUCOSE-CAPILLARY: 190 mg/dL — AB (ref 65–99)
GLUCOSE-CAPILLARY: 166 mg/dL — AB (ref 65–99)
Glucose-Capillary: 223 mg/dL — ABNORMAL HIGH (ref 65–99)

## 2017-11-30 LAB — SURGICAL PCR SCREEN
MRSA, PCR: NEGATIVE
STAPHYLOCOCCUS AUREUS: NEGATIVE

## 2017-11-30 LAB — BASIC METABOLIC PANEL
Anion gap: 9 (ref 5–15)
BUN: 13 mg/dL (ref 6–20)
CHLORIDE: 106 mmol/L (ref 101–111)
CO2: 24 mmol/L (ref 22–32)
Calcium: 8.6 mg/dL — ABNORMAL LOW (ref 8.9–10.3)
Creatinine, Ser: 1.51 mg/dL — ABNORMAL HIGH (ref 0.61–1.24)
GFR calc Af Amer: 51 mL/min — ABNORMAL LOW (ref >60)
GFR calc non Af Amer: 44 mL/min — ABNORMAL LOW (ref >60)
Glucose, Bld: 139 mg/dL — ABNORMAL HIGH (ref 65–99)
POTASSIUM: 4.5 mmol/L (ref 3.5–5.1)
SODIUM: 139 mmol/L (ref 135–145)

## 2017-11-30 LAB — CBC
HEMATOCRIT: 35.2 % — AB (ref 39.0–52.0)
Hemoglobin: 11.2 g/dL — ABNORMAL LOW (ref 13.0–17.0)
MCH: 29.9 pg (ref 26.0–34.0)
MCHC: 31.8 g/dL (ref 30.0–36.0)
MCV: 93.9 fL (ref 78.0–100.0)
Platelets: 272 10*3/uL (ref 150–400)
RBC: 3.75 MIL/uL — AB (ref 4.22–5.81)
RDW: 14.3 % (ref 11.5–15.5)
WBC: 8.3 10*3/uL (ref 4.0–10.5)

## 2017-11-30 MED ORDER — PIPERACILLIN-TAZOBACTAM 3.375 G IVPB
3.3750 g | Freq: Three times a day (TID) | INTRAVENOUS | Status: DC
  Administered 2017-11-30 – 2017-12-03 (×9): 3.375 g via INTRAVENOUS
  Filled 2017-11-30 (×10): qty 50

## 2017-11-30 MED ORDER — SODIUM CHLORIDE 0.9 % IV SOLN: 250.0000 mL | INTRAVENOUS | Status: DC | PRN

## 2017-11-30 NOTE — Progress Notes (Addendum)
  Progress Note    11/30/2017 8:20 AM 2 Days Post-Op  Subjective:  No complaints-ready for surgery  Afebrile HR 60's-80's NSR 614'J-092'H systolic 57% RA  Vitals:   11/29/17 1937 11/30/17 0513  BP: 138/67 (!) 152/68  Pulse: 82 84  Resp: 10   Temp: 97.8 F (36.6 C) 98.5 F (36.9 C)  SpO2: 94% 94%    Physical Exam: General:  No distress-sitting in chair using computer Lungs:  Non labored Extremities:  Bandage in place left foot   CBC    Component Value Date/Time   WBC 8.3 11/30/2017 0249   RBC 3.75 (L) 11/30/2017 0249   HGB 11.2 (L) 11/30/2017 0249   HCT 35.2 (L) 11/30/2017 0249   PLT 272 11/30/2017 0249   MCV 93.9 11/30/2017 0249   MCH 29.9 11/30/2017 0249   MCHC 31.8 11/30/2017 0249   RDW 14.3 11/30/2017 0249    BMET    Component Value Date/Time   NA 139 11/30/2017 0249   K 4.5 11/30/2017 0249   CL 106 11/30/2017 0249   CO2 24 11/30/2017 0249   GLUCOSE 139 (H) 11/30/2017 0249   BUN 13 11/30/2017 0249   CREATININE 1.51 (H) 11/30/2017 0249   CALCIUM 8.6 (L) 11/30/2017 0249   GFRNONAA 44 (L) 11/30/2017 0249   GFRAA 51 (L) 11/30/2017 0249    INR    Component Value Date/Time   INR 1.0 08/26/2008 1454     Intake/Output Summary (Last 24 hours) at 11/30/2017 0820 Last data filed at 11/29/2017 2101 Gross per 24 hour  Intake 720 ml  Output 250 ml  Net 470 ml     Assessment:  77 y.o. male is s/p:  Ultrasound right groin abdominal aortogram with bilateral lower extremity runoff   2 Days Post-Op  Plan: -pt doing well this morning-says he's ready for surgery and has signed the consent -npo after MN -continue IS and ambulation -DVT prophylaxis:  SQ heparin   Leontine Locket, PA-C [facility] [phone] 11/30/2017 8:20 AM  TMA tomorrow NPO p midnight  Ruta Hinds, MD [facility] [place] Office: [phone] Pager: [phone]

## 2017-12-01 ENCOUNTER — Encounter (HOSPITAL_COMMUNITY): Payer: Self-pay | Admitting: Surgery

## 2017-12-01 ENCOUNTER — Inpatient Hospital Stay (HOSPITAL_COMMUNITY): Payer: Medicare Other | Admitting: Anesthesiology

## 2017-12-01 ENCOUNTER — Encounter (HOSPITAL_COMMUNITY)
Admission: RE | Disposition: A | Discharge status: Home Health | Payer: Self-pay | Source: Ambulatory Visit | Attending: Vascular Surgery

## 2017-12-01 HISTORY — PX: TRANSMETATARSAL AMPUTATION: SHX6197

## 2017-12-01 LAB — GLUCOSE, CAPILLARY
GLUCOSE-CAPILLARY: 113 mg/dL — AB (ref 65–99)
Glucose-Capillary: 135 mg/dL — ABNORMAL HIGH (ref 65–99)
Glucose-Capillary: 133 mg/dL — ABNORMAL HIGH (ref 65–99)
Glucose-Capillary: 106 mg/dL — ABNORMAL HIGH (ref 65–99)
Glucose-Capillary: 139 mg/dL — ABNORMAL HIGH (ref 65–99)

## 2017-12-01 LAB — BASIC METABOLIC PANEL
Anion gap: 12 (ref 5–15)
BUN: 13 mg/dL (ref 6–20)
CHLORIDE: 105 mmol/L (ref 101–111)
CO2: 23 mmol/L (ref 22–32)
CREATININE: 1.57 mg/dL — AB (ref 0.61–1.24)
Calcium: 8.6 mg/dL — ABNORMAL LOW (ref 8.9–10.3)
GFR calc Af Amer: 48 mL/min — ABNORMAL LOW (ref >60)
GFR calc non Af Amer: 42 mL/min — ABNORMAL LOW (ref >60)
GLUCOSE: 130 mg/dL — AB (ref 65–99)
Potassium: 4 mmol/L (ref 3.5–5.1)
SODIUM: 140 mmol/L (ref 135–145)

## 2017-12-01 LAB — POCT I-STAT, CHEM 8
BUN: 39 mg/dL — AB (ref 6–20)
CALCIUM ION: 1.06 mmol/L — AB (ref 1.15–1.40)
CHLORIDE: 107 mmol/L (ref 101–111)
CREATININE: 1.6 mg/dL — AB (ref 0.61–1.24)
Glucose, Bld: 210 mg/dL — ABNORMAL HIGH (ref 65–99)
HEMATOCRIT: 41 % (ref 39.0–52.0)
Hemoglobin: 13.9 g/dL (ref 13.0–17.0)
Potassium: 7.9 mmol/L (ref 3.5–5.1)
Sodium: 137 mmol/L (ref 135–145)
TCO2: 26 mmol/L (ref 22–32)

## 2017-12-01 LAB — CBC
HCT: 35.9 % — ABNORMAL LOW (ref 39.0–52.0)
Hemoglobin: 11.8 g/dL — ABNORMAL LOW (ref 13.0–17.0)
MCH: 31 pg (ref 26.0–34.0)
MCHC: 32.9 g/dL (ref 30.0–36.0)
MCV: 94.2 fL (ref 78.0–100.0)
PLATELETS: 262 10*3/uL (ref 150–400)
RBC: 3.81 MIL/uL — ABNORMAL LOW (ref 4.22–5.81)
RDW: 14.3 % (ref 11.5–15.5)
WBC: 7.9 10*3/uL (ref 4.0–10.5)

## 2017-12-01 SURGERY — AMPUTATION, FOOT, TRANSMETATARSAL
Anesthesia: General | Laterality: Left

## 2017-12-01 MED ORDER — PHENYLEPHRINE 40 MCG/ML (10ML) SYRINGE FOR IV PUSH (FOR BLOOD PRESSURE SUPPORT)
PREFILLED_SYRINGE | INTRAVENOUS | Status: AC
  Filled 2017-12-01: qty 10

## 2017-12-01 MED ORDER — PHENYLEPHRINE 40 MCG/ML (10ML) SYRINGE FOR IV PUSH (FOR BLOOD PRESSURE SUPPORT)
PREFILLED_SYRINGE | INTRAVENOUS | Status: DC | PRN
  Administered 2017-12-01 (×3): 80 ug via INTRAVENOUS
  Administered 2017-12-01: 40 ug via INTRAVENOUS
  Administered 2017-12-01: 120 ug via INTRAVENOUS
  Administered 2017-12-01: 80 ug via INTRAVENOUS

## 2017-12-01 MED ORDER — ROCURONIUM BROMIDE 10 MG/ML (PF) SYRINGE
PREFILLED_SYRINGE | INTRAVENOUS | Status: AC
  Filled 2017-12-01: qty 5

## 2017-12-01 MED ORDER — FENTANYL CITRATE (PF) 100 MCG/2ML IJ SOLN
INTRAMUSCULAR | Status: DC | PRN
  Administered 2017-12-01 (×4): 25 ug via INTRAVENOUS
  Administered 2017-12-01: 50 ug via INTRAVENOUS

## 2017-12-01 MED ORDER — PROPOFOL 10 MG/ML IV BOLUS
INTRAVENOUS | Status: AC
  Filled 2017-12-01: qty 20

## 2017-12-01 MED ORDER — PHENYLEPHRINE 40 MCG/ML (10ML) SYRINGE FOR IV PUSH (FOR BLOOD PRESSURE SUPPORT)
PREFILLED_SYRINGE | INTRAVENOUS | Status: AC
  Filled 2017-12-01: qty 20

## 2017-12-01 MED ORDER — EPHEDRINE SULFATE-NACL 50-0.9 MG/10ML-% IV SOSY
PREFILLED_SYRINGE | INTRAVENOUS | Status: DC | PRN
  Administered 2017-12-01 (×2): 10 mg via INTRAVENOUS
  Administered 2017-12-01: 5 mg via INTRAVENOUS

## 2017-12-01 MED ORDER — FENTANYL CITRATE (PF) 250 MCG/5ML IJ SOLN
INTRAMUSCULAR | Status: AC
  Filled 2017-12-01: qty 5

## 2017-12-01 MED ORDER — SUCCINYLCHOLINE CHLORIDE 200 MG/10ML IV SOSY
PREFILLED_SYRINGE | INTRAVENOUS | Status: AC
  Filled 2017-12-01: qty 10

## 2017-12-01 MED ORDER — 0.9 % SODIUM CHLORIDE (POUR BTL) OPTIME
TOPICAL | Status: DC | PRN
  Administered 2017-12-01: 1000 mL

## 2017-12-01 MED ORDER — LIDOCAINE 2% (20 MG/ML) 5 ML SYRINGE
INTRAMUSCULAR | Status: DC | PRN
  Administered 2017-12-01: 60 mg via INTRAVENOUS

## 2017-12-01 MED ORDER — LIDOCAINE 2% (20 MG/ML) 5 ML SYRINGE
INTRAMUSCULAR | Status: AC
Start: 2017-12-01 — End: 2017-12-01
  Filled 2017-12-01: qty 5

## 2017-12-01 MED ORDER — PROPOFOL 10 MG/ML IV BOLUS
INTRAVENOUS | Status: DC | PRN
  Administered 2017-12-01: 150 mg via INTRAVENOUS

## 2017-12-01 MED ORDER — LACTATED RINGERS IV SOLN
INTRAVENOUS | Status: DC | PRN
  Administered 2017-12-01: 13:00:00 via INTRAVENOUS

## 2017-12-01 MED FILL — Lidocaine HCl Local Inj 1%: INTRAMUSCULAR | Qty: 20 | Status: AC

## 2017-12-01 SURGICAL SUPPLY — 42 items
BANDAGE ACE 4X5 VEL STRL LF (GAUZE/BANDAGES/DRESSINGS) ×2 IMPLANT
BANDAGE ELASTIC 4 LF NS (GAUZE/BANDAGES/DRESSINGS) ×1 IMPLANT
BLADE LONG MED 31X9 (MISCELLANEOUS) ×1 IMPLANT
BLADE SAW RECIP 87.9 MT (BLADE) ×1 IMPLANT
BNDG CMPR MED 5X4 ELC HKLP NS (GAUZE/BANDAGES/DRESSINGS) ×1
BNDG GAUZE ELAST 4 BULKY (GAUZE/BANDAGES/DRESSINGS) ×2 IMPLANT
CANISTER SUCT 3000ML PPV (MISCELLANEOUS) ×2 IMPLANT
COVER SURGICAL LIGHT HANDLE (MISCELLANEOUS) ×3 IMPLANT
DRAPE EXTREMITY BILATERAL (DRAPES) ×1 IMPLANT
DRAPE HALF SHEET 40X57 (DRAPES) ×2 IMPLANT
DRSG EMULSION OIL 3X3 NADH (GAUZE/BANDAGES/DRESSINGS) ×2 IMPLANT
DRSG VAC ATS LRG SENSATRAC (GAUZE/BANDAGES/DRESSINGS) IMPLANT
DRSG VAC ATS MED SENSATRAC (GAUZE/BANDAGES/DRESSINGS) IMPLANT
ELECT REM PT RETURN 9FT ADLT (ELECTROSURGICAL) ×2
ELECTRODE REM PT RTRN 9FT ADLT (ELECTROSURGICAL) ×1 IMPLANT
GAUZE SPONGE 4X4 12PLY STRL (GAUZE/BANDAGES/DRESSINGS) ×2 IMPLANT
GAUZE SPONGE 4X4 12PLY STRL LF (GAUZE/BANDAGES/DRESSINGS) ×1 IMPLANT
GLOVE BIO SURGEON STRL SZ 6.5 (GLOVE) ×1 IMPLANT
GLOVE BIO SURGEON STRL SZ7.5 (GLOVE) ×2 IMPLANT
GLOVE BIOGEL PI IND STRL 7.5 (GLOVE) ×1 IMPLANT
GLOVE BIOGEL PI INDICATOR 7.5 (GLOVE) ×1
GOWN STRL REUS W/ TWL LRG LVL3 (GOWN DISPOSABLE) ×3 IMPLANT
GOWN STRL REUS W/TWL LRG LVL3 (GOWN DISPOSABLE) ×6
HANDPIECE INTERPULSE COAX TIP (DISPOSABLE)
IV NS IRRIG 3000ML ARTHROMATIC (IV SOLUTION) IMPLANT
KIT BASIN OR (CUSTOM PROCEDURE TRAY) ×2 IMPLANT
KIT ROOM TURNOVER OR (KITS) ×2 IMPLANT
MANIFOLD NEPTUNE II (INSTRUMENTS) IMPLANT
NS IRRIG 1000ML POUR BTL (IV SOLUTION) ×2 IMPLANT
PACK GENERAL/GYN (CUSTOM PROCEDURE TRAY) ×2 IMPLANT
PACK UNIVERSAL I (CUSTOM PROCEDURE TRAY) IMPLANT
PAD ARMBOARD 7.5X6 YLW CONV (MISCELLANEOUS) ×4 IMPLANT
PAD NEG PRESSURE SENSATRAC (MISCELLANEOUS) ×2 IMPLANT
SET HNDPC FAN SPRY TIP SCT (DISPOSABLE) IMPLANT
SUT ETHILON 3 0 PS 1 (SUTURE) ×5 IMPLANT
SUT VIC AB 3-0 SH 18 (SUTURE) IMPLANT
SUT VIC AB 3-0 SH 27 (SUTURE) ×2
SUT VIC AB 3-0 SH 27X BRD (SUTURE) ×1 IMPLANT
TOWEL GREEN STERILE (TOWEL DISPOSABLE) ×4 IMPLANT
TOWEL GREEN STERILE FF (TOWEL DISPOSABLE) ×2 IMPLANT
UNDERPAD 30X30 (UNDERPADS AND DIAPERS) ×2 IMPLANT
WATER STERILE IRR 1000ML POUR (IV SOLUTION) ×2 IMPLANT

## 2017-12-01 NOTE — Op Note (Signed)
Procedure: Left foot transmetatarsal amputation  Preoperative diagnosis: Gangrene left foot  Postoperative diagnosis: Same  Anesthesia: Gen.  Assistant: Jasmine Awe, RNFA  Specimens: Right forefoot  Operative details: After obtaining informed consent, the patient was taken to the operating room. The patient was placed in supine position on the operating room table. After induction of general anesthesia and placement of a laryngeal mask, the patient's entire left lower extremity was prepped and draped in the usual sterile fashion. Next a transverse incision was made on the left forefoot at the midshaft level of the metatarsals. The incision was then extended longitudinally to create a posterior flap at the level of the phalangeal metatarsal joint. Periosteal elevator was used to raise the periosteum on all 5 metatarsal shafts. These were then divided with a saw. The remainder of the forefoot was removed sharply. The forefoot specimen was passed off to be sent to pathology. Hemostasis was obtained with cautery. The wound was thoroughly irrigated with normal saline solution. The metatarsal head edges were debrided back with rongeurs. The skin was closed with 3 0 nylon vertical mattress and simple sutures. There was good bleeding from the skin edge. The patient tolerated the procedure well and there were no complications. Instrument sponge and needle counts were correct at the end of the case. The patient was taken to the recovery room in stable condition.  Ruta Hinds, MD [facility] [place] Office: [phone] Pager: [phone]

## 2017-12-01 NOTE — Anesthesia Postprocedure Evaluation (Signed)
Anesthesia Post Note  Patient: [name]  Procedure(s) Performed: TRANSMETATARSAL AMPUTATION LEFT (Left )     Patient location during evaluation: PACU Anesthesia Type: General Level of consciousness: awake and alert Pain management: pain level controlled Vital Signs Assessment: post-procedure vital signs reviewed and stable Respiratory status: spontaneous breathing, nonlabored ventilation, respiratory function stable and patient connected to nasal cannula oxygen Cardiovascular status: blood pressure returned to baseline and stable Postop Assessment: no apparent nausea or vomiting Anesthetic complications: no    Last Vitals:  Vitals:   12/01/17 1512 12/01/17 1515  BP: (!) 149/63   Pulse: 73 79  Resp: (!) 24 (!) 21  Temp:  36.8 C  SpO2: 96% 96%    Last Pain:  Vitals:   12/01/17 1515  TempSrc:   PainSc: 0-No pain                 Jolan Upchurch,W. EDMOND

## 2017-12-01 NOTE — Progress Notes (Signed)
Orthopedic Tech Progress Note Patient Details:  [name] Nov 05, 1940 [id number]  Ortho Devices Type of Ortho Device: Darco shoe Ortho Device/Splint Location: applied darco post op shoe on pt left foot.  pt tolerated application very well.  left foot.  Ortho Device/Splint Interventions: Application, Adjustment   Post Interventions Patient Tolerated: Well Instructions Provided: Adjustment of device, Care of device   Kristopher Oppenheim 12/01/2017, 5:30 PM

## 2017-12-01 NOTE — Anesthesia Preprocedure Evaluation (Addendum)
Anesthesia Evaluation  Patient identified by MRN, date of birth, ID band Patient awake    Reviewed: Allergy & Precautions, NPO status , Patient's Chart, lab work & pertinent test results, reviewed documented beta blocker date and time   Airway Mallampati: II  TM Distance: >3 FB Neck ROM: Full    Dental  (+) Edentulous Lower, Edentulous Upper   Pulmonary former smoker,    Pulmonary exam normal breath sounds clear to auscultation       Cardiovascular hypertension, Pt. on medications + CAD, + CABG and + Peripheral Vascular Disease  Normal cardiovascular exam Rhythm:Regular Rate:Normal  ECG: SR, rate 48  Sees cardiologist   Neuro/Psych negative neurological ROS  negative psych ROS   GI/Hepatic negative GI ROS, Neg liver ROS,   Endo/Other  diabetes  Renal/GU Renal InsufficiencyRenal disease     Musculoskeletal negative musculoskeletal ROS (+)   Abdominal   Peds  Hematology  (+) anemia , HLD   Anesthesia Other Findings   Reproductive/Obstetrics                            Anesthesia Physical Anesthesia Plan  ASA: III  Anesthesia Plan: General   Post-op Pain Management:    Induction: Intravenous  PONV Risk Score and Plan: 2 and Ondansetron and Treatment may vary due to age or medical condition  Airway Management Planned: LMA  Additional Equipment:   Intra-op Plan:   Post-operative Plan: Extubation in OR  Informed Consent: I have reviewed the patients History and Physical, chart, labs and discussed the procedure including the risks, benefits and alternatives for the proposed anesthesia with the patient or authorized representative who has indicated his/her understanding and acceptance.   Dental advisory given  Plan Discussed with: CRNA  Anesthesia Plan Comments:        Anesthesia Quick Evaluation

## 2017-12-01 NOTE — Transfer of Care (Signed)
Immediate Anesthesia Transfer of Care Note  Patient: [name]  Procedure(s) Performed: TRANSMETATARSAL AMPUTATION LEFT (Left )  Patient Location: PACU  Anesthesia Type:General  Level of Consciousness: awake, alert , oriented and patient cooperative  Airway & Oxygen Therapy: Patient Spontanous Breathing and Patient connected to nasal cannula oxygen  Post-op Assessment: Report given to RN and Post -op Vital signs reviewed and stable  Post vital signs: Reviewed and stable  Last Vitals:  Vitals:   12/01/17 0452 12/01/17 1444  BP: (!) 159/83   Pulse: 85   Resp: (!) 27   Temp: 36.9 C (!) (P) 36.4 C  SpO2: 94%     Last Pain:  Vitals:   12/01/17 1444  TempSrc:   PainSc: (P) 0-No pain         Complications: No apparent anesthesia complications

## 2017-12-01 NOTE — Interval H&P Note (Signed)
History and Physical Interval Note:  12/01/2017 1:00 PM  [name]  has presented today for surgery, with the diagnosis of PERIPHERAL ARTERY DISEASE /NON VIALBLE TISSUE LEFT FOOT  The various methods of treatment have been discussed with the patient and family. After consideration of risks, benefits and other options for treatment, the patient has consented to  Procedure(s): TRANSMETATARSAL AMPUTATION LEFT (Left) APPLICATION OF WOUND VAC LEFT (Left) as a surgical intervention .  The patient's history has been reviewed, patient examined, no change in status, stable for surgery.  I have reviewed the patient's chart and labs.  Questions were answered to the patient's satisfaction.     Ruta Hinds

## 2017-12-02 ENCOUNTER — Encounter (HOSPITAL_COMMUNITY): Payer: Self-pay | Admitting: Vascular Surgery

## 2017-12-02 LAB — GLUCOSE, CAPILLARY
GLUCOSE-CAPILLARY: 154 mg/dL — AB (ref 65–99)
GLUCOSE-CAPILLARY: 99 mg/dL (ref 65–99)
GLUCOSE-CAPILLARY: 146 mg/dL — AB (ref 65–99)
GLUCOSE-CAPILLARY: 176 mg/dL — AB (ref 65–99)
GLUCOSE-CAPILLARY: 172 mg/dL — AB (ref 65–99)

## 2017-12-02 NOTE — Evaluation (Addendum)
Physical Therapy Evaluation Patient Details Name: [name] MRN: [id number] DOB: Jul 17, 1941 Today's Date: 12/02/2017   History of Present Illness  Pt is a 77 y/o male s/p L transmetatarsal amputation. PMH inlcudes CAD, DM, HTN, PVD s/p L femoral bypass graft.   Clinical Impression  Pt is s/p surgery above with deficits below. Pt limited in gait tolerance secondary to fatigue and L foot pain. Pt requiring min guard throughout session. Sats ranged from 90-93% before session on RA, and when checked with portable sat monitor sats reading 85% (unsure if reliable). Upon return to room, sats checked and back up at 91% with 2L. Will need to recheck oxygen sats during ambulation on RA. Feel pt will progress well once pain controlled. Will continue to follow acutely to maximize functional mobility independence and safety.      Follow Up Recommendations Home health PT;Supervision for mobility/OOB    Equipment Recommendations  None recommended by PT    Recommendations for Other Services OT consult     Precautions / Restrictions Precautions Precautions: Fall Required Braces or Orthoses: Other Brace/Splint Other Brace/Splint: Darco shoe  Restrictions Weight Bearing Restrictions: Yes LLE Weight Bearing: (Heel only )      Mobility  Bed Mobility               General bed mobility comments: In chair upon entry   Transfers Overall transfer level: Needs assistance Equipment used: Rolling walker (2 wheeled) Transfers: Sit to/from Stand Sit to Stand: Min guard         General transfer comment: Min guard for safety. Demonstrated safe hand placement.   Ambulation/Gait Ambulation/Gait assistance: Min guard Ambulation Distance (Feet): 50 Feet Assistive device: Rolling walker (2 wheeled) Gait Pattern/deviations: Step-to pattern;Decreased step length - right;Decreased step length - left;Decreased weight shift to left;Antalgic Gait velocity: Decreased  Gait velocity interpretation: Below  normal speed for age/gender General Gait Details: Slow, antalgic gait. Standing rest X 2 secondary to fatigue and increased pain. Verbal cues for sequencing using RW. Cues for WB through heel only   Stairs            Wheelchair Mobility    Modified Rankin (Stroke Patients Only)       Balance Overall balance assessment: Needs assistance Sitting-balance support: No upper extremity supported;Feet supported Sitting balance-Leahy Scale: Good     Standing balance support: Bilateral upper extremity supported;During functional activity Standing balance-Leahy Scale: Poor Standing balance comment: Reliant on BUE support                              Pertinent Vitals/Pain Pain Assessment: 0-10 Pain Score: 6  Pain Location: L foot  Pain Descriptors / Indicators: Aching;Operative site guarding Pain Intervention(s): Limited activity within patient's tolerance;Monitored during session;Repositioned    Home Living Family/patient expects to be discharged to:: Private residence Living Arrangements: Spouse/significant other Available Help at Discharge: Family;Available 24 hours/day Type of Home: House Home Access: Stairs to enter Entrance Stairs-Rails: Left;Right;Can reach both Entrance Stairs-Number of Steps: 3 Home Layout: One level Home Equipment: Walker - 2 wheels;Cane - single point;Bedside commode;Shower seat;Grab bars - tub/shower      Prior Function Level of Independence: Independent               Hand Dominance   Dominant Hand: Right    Extremity/Trunk Assessment   Upper Extremity Assessment Upper Extremity Assessment: Defer to OT evaluation    Lower Extremity Assessment Lower Extremity Assessment:  LLE deficits/detail LLE Deficits / Details: s/p transmetatarsal amputation.     Cervical / Trunk Assessment Cervical / Trunk Assessment: Normal  Communication   Communication: No difficulties  Cognition Arousal/Alertness: Awake/alert Behavior  During Therapy: WFL for tasks assessed/performed Overall Cognitive Status: Within Functional Limits for tasks assessed                                        General Comments General comments (skin integrity, edema, etc.): Pt's wife present during session. Educated about importance of mobility at home and activity pacing to perform at home.     Exercises     Assessment/Plan    PT Assessment Patient needs continued PT services  PT Problem List Decreased strength;Decreased activity tolerance;Decreased mobility;Decreased balance;Decreased knowledge of use of DME;Decreased knowledge of precautions;Pain       PT Treatment Interventions DME instruction;Gait training;Stair training;Functional mobility training;Therapeutic activities;Therapeutic exercise;Balance training;Neuromuscular re-education;Patient/family education    PT Goals (Current goals can be found in the Care Plan section)  Acute Rehab PT Goals Patient Stated Goal: to go home  PT Goal Formulation: With patient Time For Goal Achievement: 12/16/17 Potential to Achieve Goals: Good    Frequency Min 3X/week   Barriers to discharge        Co-evaluation               AM-PAC PT "6 Clicks" Daily Activity  Outcome Measure Difficulty turning over in bed (including adjusting bedclothes, sheets and blankets)?: A Little Difficulty moving from lying on back to sitting on the side of the bed? : Unable Difficulty sitting down on and standing up from a chair with arms (e.g., wheelchair, bedside commode, etc,.)?: Unable Help needed moving to and from a bed to chair (including a wheelchair)?: A Little Help needed walking in hospital room?: A Little Help needed climbing 3-5 steps with a railing? : A Lot 6 Click Score: 13    End of Session Equipment Utilized During Treatment: Gait belt;Other (comment)(darco shoe ) Activity Tolerance: Patient limited by pain;Patient limited by fatigue Patient left: in chair;with  call bell/phone within reach;with family/visitor present Nurse Communication: Mobility status PT Visit Diagnosis: Unsteadiness on feet (R26.81);Other abnormalities of gait and mobility (R26.89);Pain Pain - Right/Left: Left Pain - part of body: Ankle and joints of foot    Time: 4920-1007 PT Time Calculation (min) (ACUTE ONLY): 25 min   Charges:   PT Evaluation $PT Eval Low Complexity: 1 Low PT Treatments $Gait Training: 8-22 mins   PT G Codes:        Leighton Ruff, PT, DPT  Acute Rehabilitation Services  Pager: [phone]   Rudean Hitt 12/02/2017, 1:02 PM

## 2017-12-02 NOTE — Progress Notes (Signed)
[facility] [place]  Subjective  - feels ok   Objective 131/67 74 (!) 97.5 F (36.4 C) (Oral) (!) 24 95%  Intake/Output Summary (Last 24 hours) at 12/02/2017 1249 Last data filed at 12/02/2017 0300 Gross per 24 hour  Intake 750 ml  Output 525 ml  Net 225 ml   TMA dressing dry 2+ graft pulse  Assessment/Planning: Change dressing tomorrow Most likely d/c home tomorrow  Ruta Hinds 12/02/2017 12:49 PM --  Laboratory Lab Results: Recent Labs    11/30/17 0249 12/01/17 0424  WBC 8.3 7.9  HGB 11.2* 11.8*  HCT 35.2* 35.9*  PLT 272 262   BMET Recent Labs    11/30/17 0249 12/01/17 0424  NA 139 140  K 4.5 4.0  CL 106 105  CO2 24 23  GLUCOSE 139* 130*  BUN 13 13  CREATININE 1.51* 1.57*  CALCIUM 8.6* 8.6*    COAG Lab Results  Component Value Date   INR 1.0 08/26/2008   No results found for: PTT

## 2017-12-03 ENCOUNTER — Telehealth: Payer: Self-pay | Admitting: Vascular Surgery

## 2017-12-03 LAB — GLUCOSE, CAPILLARY
GLUCOSE-CAPILLARY: 160 mg/dL — AB (ref 65–99)
GLUCOSE-CAPILLARY: 178 mg/dL — AB (ref 65–99)

## 2017-12-03 MED ORDER — GLUCERNA SHAKE PO LIQD: 237.0000 mL | Freq: Three times a day (TID) | ORAL | 0 refills | Status: DC

## 2017-12-03 MED ORDER — OXYCODONE-ACETAMINOPHEN 5-325 MG PO TABS: 1.0000 | ORAL_TABLET | Freq: Four times a day (QID) | ORAL | 0 refills | Status: DC | PRN

## 2017-12-03 NOTE — Progress Notes (Signed)
[facility] [place]  Subjective  - feels ok   Objective (!) 96/51 78 98.3 F (36.8 C) (Oral) 17 93%  Intake/Output Summary (Last 24 hours) at 12/03/2017 1428 Last data filed at 12/03/2017 0906 Gross per 24 hour  Intake 567 ml  Output 200 ml  Net 367 ml   TMA healing + graft pulse Assessment/Planning: D/c home today  Ruta Hinds 12/03/2017 2:28 PM --  Laboratory Lab Results: Recent Labs    12/01/17 0424  WBC 7.9  HGB 11.8*  HCT 35.9*  PLT 262   BMET Recent Labs    12/01/17 0424  NA 140  K 4.0  CL 105  CO2 23  GLUCOSE 130*  BUN 13  CREATININE 1.57*  CALCIUM 8.6*    COAG Lab Results  Component Value Date   INR 1.0 08/26/2008   No results found for: PTT

## 2017-12-03 NOTE — Care Management Note (Signed)
Case Management Note Marvetta Gibbons RN, BSN Unit 4E-Case Manager [phone]  Patient Details  Name: [name] MRN: [id number] Date of Birth: 08-30-1941  Subjective/Objective:   Pt admitted with gangrene of foot s/p TMA                 Action/Plan: PTA pt lived at home with spouse- orders placed for [facility] needs- spoke with pt and wife at bedside for transition of care needs- per conversation pt has cane, BSC, walker at home- no DME needs identified- discussed HH needs- order placed for HHPT- pt and wife also state that they discussed [place] with MD/PA- will page PA to add [facility] to orders- choice offered for [facility] agency- they think they have used [facility] in past would like to check with them first and second choice would be [place] [facility]- call made to [facility] with [facility]- however they do not service where pt lives anymore- call then made to [facility] who states that they can service where pt lives and are able to accept referral- orders and paperwork faxed to [facility] at [phone] via epic. Pt's wife to f/u with vascular office regarding dressing supplies.   Expected Discharge Date:     12/03/17             Expected Discharge Plan:  [place]  In-House Referral:     Discharge planning Services  CM Consult  Post Acute Care Choice:  Home Health Choice offered to:  Patient  DME Arranged:    DME Agency:     HH Arranged:  RN, PT [place] Agency:  [facility]  Status of Service:  Completed, signed off  If discussed at [employer] of Stay Meetings, dates discussed:    Discharge Disposition: home /home health   Additional Comments:  Dawayne Patricia, RN 12/03/2017, 2:08 PM

## 2017-12-03 NOTE — Telephone Encounter (Signed)
-----   Message from Mena Goes, RN sent at 12/03/2017  9:15 AM EST ----- Regarding: 2-3 weeks for postop suture removal   ----- Message ----- From: Gabriel Earing, PA-C Sent: 12/03/2017   9:12 AM To: Vvs Charge Pool  S/p left TMA.  F/u with Dr. Oneida Alar in 2-3 weeks.  Will need sutures removed.   Thanks

## 2017-12-03 NOTE — Telephone Encounter (Signed)
Sched appt 12/25/17 at 1:30. Lm on hm# to inform pt of appt.

## 2017-12-03 NOTE — Discharge Summary (Signed)
Discharge Summary    [name] Jan 12, 1941 77 y.o. male  [id number]  Admission Date: 11/28/2017  Discharge Date: 12/03/17  Physician: Elam Dutch, MD  Admission Diagnosis: Gangrene of left foot [facility]) [I96]   HPI:   This is a 77 y.o. male who is referred for evaluation of gangrene left foot.  Patient states he had fairly sudden onset of pain in his left foot about 2 weeks ago.  3-5 days ago toes 3 and 5 on the left foot turned black.  He was briefly admitted to [facility].  He was given antibiotics at that point.  He was then discharged.  He currently is on Augmentin for antibiotic coverage.  He has been on this for 4 days.  He had ABIs performed at [facility] which showed the left leg ABI had dropped from greater than 1 to 0.6.  Right ABI was 0.87.  He also had an MRI scan of the left foot which showed edema consistent with cellulitis.  He previously underwent a left superficial femoral to dorsalis pedis bypass with vein September 2006.  He underwent amputation of his left first toe as well.  He subsequently underwent a left superficial femoral artery endarterectomy with bovine pericardial patch in 2009.  He was last seen in our office in May 2018.  At that time he had no graft stenosis in his bypass.  ABI on the left was 1.03 right was 0.93 .  He has also had a prior left carotid endarterectomy in [place] 2000.  Other medical problems include coronary artery disease, diabetes, arthritis, hyperlipidemia, hypertension all of which have been currently controlled.  He is on aspirin and a statin.  Hospital Course:  The patient was admitted to the hospital and placed on IV abx and taken to the [place] lab on 11/28/17 and underwent: Ultrasound right groin abdominal aortogram with bilateral lower extremity runoff  With finding of the following: #1 patent left superficial femoral to dorsalis pedis artery bypass but occluded distal outflow 2.  Multilevel right superficial femoral  artery stenosis with one-vessel peroneal runoff to the right foot  He was kept over the weekend for IV abx prior to surgery.   On POD 1, he was doing well.  His groin was soft without hematoma.    On 12/01/17, he was taken to the operating room on 12/01/2017 and underwent: Left transmetatarsal amputation.  He tolerated this well.  The pt tolerated the procedure well and was transported to the PACU in good condition.   By POD 1, he was doing well.  His dressing was removed and incision was clean with some mild bloody ooze at the edge medially and laterally.  He had a 2+ graft pulse.    On POD 2, he was doing well.  He continues to have a palpable graft pulse.  TMA incision looks good and he was discharged home.   PT worked with the pt and recommended HHPT.    The remainder of the hospital course consisted of increasing mobilization and increasing intake of solids without difficulty.  CBC    Component Value Date/Time   WBC 7.9 12/01/2017 0424   RBC 3.81 (L) 12/01/2017 0424   HGB 11.8 (L) 12/01/2017 0424   HCT 35.9 (L) 12/01/2017 0424   PLT 262 12/01/2017 0424   MCV 94.2 12/01/2017 0424   MCH 31.0 12/01/2017 0424   MCHC 32.9 12/01/2017 0424   RDW 14.3 12/01/2017 0424    BMET    Component Value  Date/Time   NA 140 12/01/2017 0424   K 4.0 12/01/2017 0424   CL 105 12/01/2017 0424   CO2 23 12/01/2017 0424   GLUCOSE 130 (H) 12/01/2017 0424   BUN 13 12/01/2017 0424   CREATININE 1.57 (H) 12/01/2017 0424   CALCIUM 8.6 (L) 12/01/2017 0424   GFRNONAA 42 (L) 12/01/2017 0424   GFRAA 48 (L) 12/01/2017 0424      Discharge Instructions    Call MD for:  redness, tenderness, or signs of infection (pain, swelling, bleeding, redness, odor or green/yellow discharge around incision site)   Complete by:  As directed    Call MD for:  severe or increased pain, loss or decreased feeling  in affected limb(s)   Complete by:  As directed    Call MD for:  temperature >100.5   Complete by:  As  directed    Discharge instructions   Complete by:  As directed    Heel weight bearing only with post op shoe.   Discharge wound care:   Complete by:  As directed    Shower daily with soap and water.   Driving Restrictions   Complete by:  As directed    Do not drive for 2 weeks and while taking pain medication.   Lifting restrictions   Complete by:  As directed    No heavy lifting for 3 weeks   Resume previous diet   Complete by:  As directed       Discharge Diagnosis:  Gangrene of left foot ([place]) [I96]  Secondary Diagnosis: Patient Active Problem List   Diagnosis Date Noted  . Gangrene of left foot ([place]) 11/28/2017  . Hx of CABG 06/25/2017  . ATN (acute tubular necrosis) ([place]) 03/28/2016  . Hematuria 03/28/2016  . Hyperkalemia 03/28/2016  . Benign hypertension with CKD (chronic kidney disease) stage III ([place]) 03/13/2016  . Type 2 diabetes mellitus ([place]) 03/13/2016  . CAD (coronary artery disease) 12/14/2015  . Dyslipidemia 09/25/2015  . Impaired renal function 09/25/2015  . Peripheral vascular disease, unspecified ([place]) 09/02/2012   Past Medical History:  Diagnosis Date  . Arthritis   . CAD (coronary artery disease)   . Carotid artery occlusion   . Diabetes mellitus Age 43  . Hyperlipidemia   . Hypertension   . Joint pain   . Peripheral vascular disease ([place])   . Ulcer      Allergies as of 12/03/2017   No Known Allergies     Medication List    TAKE these medications   aspirin EC 325 MG tablet Take 325 mg by mouth daily.   AUGMENTIN PO Take by mouth.   carvedilol 12.5 MG tablet Commonly known as:  COREG Take 1 tablet (12.5 mg total) by mouth 2 (two) times daily with a meal.   ezetimibe 10 MG tablet Commonly known as:  ZETIA Take 1 tablet (10 mg total) by mouth daily.   feeding supplement (GLUCERNA SHAKE) Liqd Take 237 mLs by mouth 3 (three) times daily between meals.   fenofibrate 160 MG tablet Take 160 mg by mouth daily.   linagliptin 5 MG  Tabs tablet Commonly known as:  TRADJENTA Take 5 mg by mouth.   lisinopril 40 MG tablet Commonly known as:  PRINIVIL,ZESTRIL Take 20 mg by mouth daily.   nitroGLYCERIN 0.4 MG SL tablet Commonly known as:  NITROSTAT Place 1 tablet (0.4 mg total) under the tongue every 5 (five) minutes as needed for chest pain.   oxyCODONE-acetaminophen 5-325 MG tablet Commonly known  as:  PERCOCET Take 1 tablet by mouth every 6 (six) hours as needed for severe pain.   pioglitazone 45 MG tablet Commonly known as:  ACTOS Take 45 mg by mouth every morning.   rosuvastatin 40 MG tablet Commonly known as:  CRESTOR Take 20 mg by mouth daily.   Vitamin D (Ergocalciferol) 50000 units Caps capsule Commonly known as:  DRISDOL Take 50,000 Units by mouth every 7 (seven) days. Takes on Monday   vitamin E 400 UNIT capsule Take 400 Units by mouth daily.            Discharge Care Instructions  (From admission, onward)        Start     Ordered   12/03/17 0000  Discharge wound care:    Comments:  Shower daily with soap and water.   12/03/17 0914      Prescriptions given: Roxicet #20 No Refill  Instructions: 1.  No driving for 2 weeks and while taking pain medication 2.  No heavy lifting x 3 weeks 3.  Shower daily starting 12/04/17 with soap and water. 4.  Heel weight bearing only with post op shoe.  Disposition: home  Patient's condition: is Good  Follow up: 1. Dr. Oneida Alar in 2-3 weeks-will need suture removal   Leontine Locket, PA-C [facility] [phone] 12/03/2017  9:14 AM

## 2017-12-03 NOTE — Evaluation (Signed)
Occupational Therapy Evaluation and Discharge Patient Details Name: [name] MRN: [id number] DOB: 01/09/1941 Today's Date: 12/03/2017    History of Present Illness Pt is a 77 y/o male s/p L transmetatarsal amputation. PMH inlcudes CAD, DM, HTN, PVD s/p L femoral bypass graft.    Clinical Impression   PTA Pt independent in ADL and functional transfers. Pt is currently set up for seated ADL and mod A for LB ADL (dressing/donning Darco shoe) with wife independent in assisting him. Ot educated on compensatory strategies, safety with sequencing, reinforced WB precautions. Pt has appropriate DME at home. Education complete. No further questions from Pt or wife, OT to sign off at this time as Pt is going home today. Thank you for the opportunity to serve this patient.     Follow Up Recommendations  No OT follow up    Equipment Recommendations  None recommended by OT(Pt has appropriate DME)    Recommendations for Other Services       Precautions / Restrictions Precautions Precautions: Fall Required Braces or Orthoses: Other Brace/Splint Other Brace/Splint: Darco shoe  Restrictions Weight Bearing Restrictions: Yes LLE Weight Bearing: (Heel only )      Mobility Bed Mobility               General bed mobility comments: In chair upon entry   Transfers Overall transfer level: Needs assistance Equipment used: Rolling walker (2 wheeled) Transfers: Sit to/from Stand Sit to Stand: Min guard         General transfer comment: Min guard for safety. Demonstrated safe hand placement.     Balance Overall balance assessment: Needs assistance Sitting-balance support: No upper extremity supported;Feet supported Sitting balance-Leahy Scale: Good Sitting balance - Comments: good balance during attempts at LB dressing   Standing balance support: Bilateral upper extremity supported;During functional activity Standing balance-Leahy Scale: Poor Standing balance comment: Reliant on BUE  support, or leaning against sink                           ADL either performed or assessed with clinical judgement   ADL Overall ADL's : Needs assistance/impaired Eating/Feeding: Modified independent   Grooming: Set up   Upper Body Bathing: With caregiver independent assisting;Minimal assistance;Sitting   Lower Body Bathing: Moderate assistance;With caregiver independent assisting;Sitting/lateral leans   Upper Body Dressing : Set up   Lower Body Dressing: Moderate assistance Lower Body Dressing Details (indicate cue type and reason): wife able to don darco shoe without assist from staff Toilet Transfer: Min guard;Ambulation;Comfort height toilet;Grab bars;RW Armed forces technical officer Details (indicate cue type and reason): safe hand placement Toileting- Clothing Manipulation and Hygiene: Min guard;Sit to/from stand       Functional mobility during ADLs: Min guard;Rolling walker(cues for WB through heel) General ADL Comments: educated in compensatory strategies and sequencing (dress operated leg first) wife willing and able to assist as needed     Vision Baseline Vision/History: Wears glasses Wears Glasses: At all times Patient Visual Report: No change from baseline       Perception     Praxis      Pertinent Vitals/Pain Pain Assessment: Faces Faces Pain Scale: Hurts little more Pain Location: L foot  Pain Descriptors / Indicators: Aching;Operative site guarding;Sore Pain Intervention(s): Limited activity within patient's tolerance;Monitored during session;Repositioned;Other (comment)(elevation)     Hand Dominance Right   Extremity/Trunk Assessment Upper Extremity Assessment Upper Extremity Assessment: Overall WFL for tasks assessed   Lower Extremity Assessment Lower Extremity Assessment:  LLE deficits/detail LLE Deficits / Details: s/p transmetatarsal amputation.    Cervical / Trunk Assessment Cervical / Trunk Assessment: Normal   Communication  Communication Communication: No difficulties   Cognition Arousal/Alertness: Awake/alert Behavior During Therapy: WFL for tasks assessed/performed Overall Cognitive Status: Within Functional Limits for tasks assessed                                     General Comments  Wife present during education; willing and able to assist husband as needed    Exercises     Shoulder Instructions      Home Living Family/patient expects to be discharged to:: Private residence Living Arrangements: Spouse/significant other Available Help at Discharge: Family;Available 24 hours/day Type of Home: House Home Access: Stairs to enter [employer] of Steps: 3 Entrance Stairs-Rails: Left;Right;Can reach both Home Layout: One level     Bathroom Shower/Tub: Occupational psychologist: Handicapped height Bathroom Accessibility: Yes How Accessible: Accessible via walker Home Equipment: [place] - 2 wheels;Cane - single point;Bedside commode;Shower seat;Grab bars - tub/shower          Prior Functioning/Environment Level of Independence: Independent                 OT Problem List: Decreased activity tolerance;Impaired balance (sitting and/or standing);Decreased knowledge of use of DME or AE;Decreased knowledge of precautions      OT Treatment/Interventions:      OT Goals(Current goals can be found in the care plan section) Acute Rehab OT Goals Patient Stated Goal: to go home  OT Goal Formulation: With patient/family Time For Goal Achievement: 12/17/17 Potential to Achieve Goals: Good  OT Frequency:     Barriers to D/C:            Co-evaluation              AM-PAC PT "6 Clicks" Daily Activity     Outcome Measure Help from another person eating meals?: None Help from another person taking care of personal grooming?: A Little Help from another person toileting, which includes using toliet, bedpan, or urinal?: A Little Help from another person  bathing (including washing, rinsing, drying)?: A Lot Help from another person to put on and taking off regular upper body clothing?: None Help from another person to put on and taking off regular lower body clothing?: A Lot 6 Click Score: 18   End of Session Equipment Utilized During Treatment: Gait belt;Rolling walker;Other (comment)(darco shoe) Nurse Communication: Mobility status  Activity Tolerance: Patient tolerated treatment well Patient left: in chair;with call bell/phone within reach;with family/visitor present  OT Visit Diagnosis: Unsteadiness on feet (R26.81);Other abnormalities of gait and mobility (R26.89);Pain Pain - Right/Left: Left Pain - part of body: Ankle and joints of foot                Time: 1415-1435 OT Time Calculation (min): 20 min Charges:  OT General Charges $OT Visit: 1 Visit OT Evaluation $OT Eval Low Complexity: 1 Low G-Codes:    Hulda Humphrey OTR/L [place] 12/03/2017, 4:50 PM

## 2017-12-04 ENCOUNTER — Telehealth: Payer: Self-pay | Admitting: *Deleted

## 2017-12-04 NOTE — Telephone Encounter (Signed)
Patients wife [name] called and left a message asking [place] to return the call, leaving no information of any issue.  I called the # [phone] at 4:16 pm and the line was busy.  I tried calling the cell # and no answer.  I tried the # left by wife again @ 4:42 and no answer again.

## 2017-12-05 DIAGNOSIS — I6529 Occlusion and stenosis of unspecified carotid artery: Secondary | ICD-10-CM | POA: Diagnosis not present

## 2017-12-05 DIAGNOSIS — Z7984 Long term (current) use of oral hypoglycemic drugs: Secondary | ICD-10-CM | POA: Diagnosis not present

## 2017-12-05 DIAGNOSIS — Z89432 Acquired absence of left foot: Secondary | ICD-10-CM | POA: Diagnosis not present

## 2017-12-05 DIAGNOSIS — Z4781 Encounter for orthopedic aftercare following surgical amputation: Secondary | ICD-10-CM | POA: Diagnosis not present

## 2017-12-05 DIAGNOSIS — M1991 Primary osteoarthritis, unspecified site: Secondary | ICD-10-CM | POA: Diagnosis not present

## 2017-12-05 DIAGNOSIS — N183 Chronic kidney disease, stage 3 (moderate): Secondary | ICD-10-CM | POA: Diagnosis not present

## 2017-12-05 DIAGNOSIS — E1151 Type 2 diabetes mellitus with diabetic peripheral angiopathy without gangrene: Secondary | ICD-10-CM | POA: Diagnosis not present

## 2017-12-05 DIAGNOSIS — L03116 Cellulitis of left lower limb: Secondary | ICD-10-CM | POA: Diagnosis not present

## 2017-12-05 DIAGNOSIS — E1122 Type 2 diabetes mellitus with diabetic chronic kidney disease: Secondary | ICD-10-CM | POA: Diagnosis not present

## 2017-12-05 DIAGNOSIS — I129 Hypertensive chronic kidney disease with stage 1 through stage 4 chronic kidney disease, or unspecified chronic kidney disease: Secondary | ICD-10-CM | POA: Diagnosis not present

## 2017-12-05 DIAGNOSIS — I2510 Atherosclerotic heart disease of native coronary artery without angina pectoris: Secondary | ICD-10-CM | POA: Diagnosis not present

## 2017-12-05 DIAGNOSIS — Z951 Presence of aortocoronary bypass graft: Secondary | ICD-10-CM | POA: Diagnosis not present

## 2017-12-05 DIAGNOSIS — Z9181 History of falling: Secondary | ICD-10-CM | POA: Diagnosis not present

## 2017-12-06 DIAGNOSIS — Z4781 Encounter for orthopedic aftercare following surgical amputation: Secondary | ICD-10-CM | POA: Diagnosis not present

## 2017-12-06 DIAGNOSIS — I129 Hypertensive chronic kidney disease with stage 1 through stage 4 chronic kidney disease, or unspecified chronic kidney disease: Secondary | ICD-10-CM | POA: Diagnosis not present

## 2017-12-06 DIAGNOSIS — E1122 Type 2 diabetes mellitus with diabetic chronic kidney disease: Secondary | ICD-10-CM | POA: Diagnosis not present

## 2017-12-06 DIAGNOSIS — L03116 Cellulitis of left lower limb: Secondary | ICD-10-CM | POA: Diagnosis not present

## 2017-12-06 DIAGNOSIS — E1151 Type 2 diabetes mellitus with diabetic peripheral angiopathy without gangrene: Secondary | ICD-10-CM | POA: Diagnosis not present

## 2017-12-06 DIAGNOSIS — I2510 Atherosclerotic heart disease of native coronary artery without angina pectoris: Secondary | ICD-10-CM | POA: Diagnosis not present

## 2017-12-08 DIAGNOSIS — Z4781 Encounter for orthopedic aftercare following surgical amputation: Secondary | ICD-10-CM | POA: Diagnosis not present

## 2017-12-08 DIAGNOSIS — E1151 Type 2 diabetes mellitus with diabetic peripheral angiopathy without gangrene: Secondary | ICD-10-CM | POA: Diagnosis not present

## 2017-12-08 DIAGNOSIS — I2510 Atherosclerotic heart disease of native coronary artery without angina pectoris: Secondary | ICD-10-CM | POA: Diagnosis not present

## 2017-12-08 DIAGNOSIS — L03116 Cellulitis of left lower limb: Secondary | ICD-10-CM | POA: Diagnosis not present

## 2017-12-08 DIAGNOSIS — I129 Hypertensive chronic kidney disease with stage 1 through stage 4 chronic kidney disease, or unspecified chronic kidney disease: Secondary | ICD-10-CM | POA: Diagnosis not present

## 2017-12-08 DIAGNOSIS — E1122 Type 2 diabetes mellitus with diabetic chronic kidney disease: Secondary | ICD-10-CM | POA: Diagnosis not present

## 2017-12-12 ENCOUNTER — Ambulatory Visit: Payer: Medicare Other | Admitting: Sports Medicine

## 2017-12-12 DIAGNOSIS — L03116 Cellulitis of left lower limb: Secondary | ICD-10-CM | POA: Diagnosis not present

## 2017-12-12 DIAGNOSIS — Z4781 Encounter for orthopedic aftercare following surgical amputation: Secondary | ICD-10-CM | POA: Diagnosis not present

## 2017-12-12 DIAGNOSIS — I129 Hypertensive chronic kidney disease with stage 1 through stage 4 chronic kidney disease, or unspecified chronic kidney disease: Secondary | ICD-10-CM | POA: Diagnosis not present

## 2017-12-12 DIAGNOSIS — I2510 Atherosclerotic heart disease of native coronary artery without angina pectoris: Secondary | ICD-10-CM | POA: Diagnosis not present

## 2017-12-12 DIAGNOSIS — E1151 Type 2 diabetes mellitus with diabetic peripheral angiopathy without gangrene: Secondary | ICD-10-CM | POA: Diagnosis not present

## 2017-12-12 DIAGNOSIS — E1122 Type 2 diabetes mellitus with diabetic chronic kidney disease: Secondary | ICD-10-CM | POA: Diagnosis not present

## 2017-12-16 DIAGNOSIS — E1151 Type 2 diabetes mellitus with diabetic peripheral angiopathy without gangrene: Secondary | ICD-10-CM | POA: Diagnosis not present

## 2017-12-16 DIAGNOSIS — Z4781 Encounter for orthopedic aftercare following surgical amputation: Secondary | ICD-10-CM | POA: Diagnosis not present

## 2017-12-16 DIAGNOSIS — I2510 Atherosclerotic heart disease of native coronary artery without angina pectoris: Secondary | ICD-10-CM | POA: Diagnosis not present

## 2017-12-16 DIAGNOSIS — I129 Hypertensive chronic kidney disease with stage 1 through stage 4 chronic kidney disease, or unspecified chronic kidney disease: Secondary | ICD-10-CM | POA: Diagnosis not present

## 2017-12-16 DIAGNOSIS — E1122 Type 2 diabetes mellitus with diabetic chronic kidney disease: Secondary | ICD-10-CM | POA: Diagnosis not present

## 2017-12-16 DIAGNOSIS — L03116 Cellulitis of left lower limb: Secondary | ICD-10-CM | POA: Diagnosis not present

## 2017-12-18 DIAGNOSIS — L03116 Cellulitis of left lower limb: Secondary | ICD-10-CM | POA: Diagnosis not present

## 2017-12-18 DIAGNOSIS — I2510 Atherosclerotic heart disease of native coronary artery without angina pectoris: Secondary | ICD-10-CM | POA: Diagnosis not present

## 2017-12-18 DIAGNOSIS — Z4781 Encounter for orthopedic aftercare following surgical amputation: Secondary | ICD-10-CM | POA: Diagnosis not present

## 2017-12-18 DIAGNOSIS — I129 Hypertensive chronic kidney disease with stage 1 through stage 4 chronic kidney disease, or unspecified chronic kidney disease: Secondary | ICD-10-CM | POA: Diagnosis not present

## 2017-12-18 DIAGNOSIS — E1122 Type 2 diabetes mellitus with diabetic chronic kidney disease: Secondary | ICD-10-CM | POA: Diagnosis not present

## 2017-12-18 DIAGNOSIS — E1151 Type 2 diabetes mellitus with diabetic peripheral angiopathy without gangrene: Secondary | ICD-10-CM | POA: Diagnosis not present

## 2017-12-23 DIAGNOSIS — I2510 Atherosclerotic heart disease of native coronary artery without angina pectoris: Secondary | ICD-10-CM | POA: Diagnosis not present

## 2017-12-23 DIAGNOSIS — E1122 Type 2 diabetes mellitus with diabetic chronic kidney disease: Secondary | ICD-10-CM | POA: Diagnosis not present

## 2017-12-23 DIAGNOSIS — E1151 Type 2 diabetes mellitus with diabetic peripheral angiopathy without gangrene: Secondary | ICD-10-CM | POA: Diagnosis not present

## 2017-12-23 DIAGNOSIS — I129 Hypertensive chronic kidney disease with stage 1 through stage 4 chronic kidney disease, or unspecified chronic kidney disease: Secondary | ICD-10-CM | POA: Diagnosis not present

## 2017-12-23 DIAGNOSIS — Z4781 Encounter for orthopedic aftercare following surgical amputation: Secondary | ICD-10-CM | POA: Diagnosis not present

## 2017-12-23 DIAGNOSIS — L03116 Cellulitis of left lower limb: Secondary | ICD-10-CM | POA: Diagnosis not present

## 2017-12-25 ENCOUNTER — Encounter: Payer: Self-pay | Admitting: Vascular Surgery

## 2017-12-25 ENCOUNTER — Ambulatory Visit (INDEPENDENT_AMBULATORY_CARE_PROVIDER_SITE_OTHER): Payer: Medicare Other | Admitting: Vascular Surgery

## 2017-12-25 VITALS — BP 124/70 | HR 78 | Temp 97.9°F | Resp 20 | Ht 69.0 in | Wt 201.8 lb

## 2017-12-25 DIAGNOSIS — I739 Peripheral vascular disease, unspecified: Secondary | ICD-10-CM

## 2017-12-25 NOTE — Progress Notes (Signed)
Patient returns today for follow-up after recent left transmetatarsal amputation.  He has also previously had a dorsalis pedis bypass on the left leg.  He reports a small amount of drainage from the lateral aspect of the foot.  He has no real significant pain.  He is trying to bear weight only on the heel.  Physical exam:  Vitals:   12/25/17 1355  BP: 124/70  Pulse: 78  Resp: 20  Temp: 97.9 F (36.6 C)  TempSrc: Oral  SpO2: 96%  Weight: 201 lb 12.8 oz (91.5 kg)  Height: 5\' 9"  (1.753 m)    Left foot easily palpable dorsalis pedis pulse some slight separation of the skin in the lateral aspect of the transmetatarsal amputation.  Otherwise the posterior flap appears healthy and viable.  Assessment: Tenuous but healing left transmetatarsal amputation.  The patient will continue to wash this daily with soap and water and weight-bear on the heel only.  He will follow-up with [place] for recheck in 1 month.  He was given a note to return to work full duty today.  However he is to be weightbearing on the heel only.  Ruta Hinds, MD [facility] Office: [phone] Pager: [phone]

## 2017-12-29 DIAGNOSIS — E1122 Type 2 diabetes mellitus with diabetic chronic kidney disease: Secondary | ICD-10-CM | POA: Diagnosis not present

## 2017-12-29 DIAGNOSIS — L03116 Cellulitis of left lower limb: Secondary | ICD-10-CM | POA: Diagnosis not present

## 2017-12-29 DIAGNOSIS — I2510 Atherosclerotic heart disease of native coronary artery without angina pectoris: Secondary | ICD-10-CM | POA: Diagnosis not present

## 2017-12-29 DIAGNOSIS — I129 Hypertensive chronic kidney disease with stage 1 through stage 4 chronic kidney disease, or unspecified chronic kidney disease: Secondary | ICD-10-CM | POA: Diagnosis not present

## 2017-12-29 DIAGNOSIS — Z4781 Encounter for orthopedic aftercare following surgical amputation: Secondary | ICD-10-CM | POA: Diagnosis not present

## 2017-12-29 DIAGNOSIS — E1151 Type 2 diabetes mellitus with diabetic peripheral angiopathy without gangrene: Secondary | ICD-10-CM | POA: Diagnosis not present

## 2018-01-01 DIAGNOSIS — Z125 Encounter for screening for malignant neoplasm of prostate: Secondary | ICD-10-CM | POA: Diagnosis not present

## 2018-01-01 DIAGNOSIS — Z1211 Encounter for screening for malignant neoplasm of colon: Secondary | ICD-10-CM | POA: Diagnosis not present

## 2018-01-01 DIAGNOSIS — Z9181 History of falling: Secondary | ICD-10-CM | POA: Diagnosis not present

## 2018-01-01 DIAGNOSIS — E785 Hyperlipidemia, unspecified: Secondary | ICD-10-CM | POA: Diagnosis not present

## 2018-01-01 DIAGNOSIS — J189 Pneumonia, unspecified organism: Secondary | ICD-10-CM | POA: Diagnosis not present

## 2018-01-01 DIAGNOSIS — Z1389 Encounter for screening for other disorder: Secondary | ICD-10-CM | POA: Diagnosis not present

## 2018-01-01 DIAGNOSIS — J181 Lobar pneumonia, unspecified organism: Secondary | ICD-10-CM | POA: Diagnosis not present

## 2018-01-01 DIAGNOSIS — Z136 Encounter for screening for cardiovascular disorders: Secondary | ICD-10-CM | POA: Diagnosis not present

## 2018-01-01 DIAGNOSIS — Z6831 Body mass index (BMI) 31.0-31.9, adult: Secondary | ICD-10-CM | POA: Diagnosis not present

## 2018-01-01 DIAGNOSIS — E669 Obesity, unspecified: Secondary | ICD-10-CM | POA: Diagnosis not present

## 2018-01-01 DIAGNOSIS — Z0000 Encounter for general adult medical examination without abnormal findings: Secondary | ICD-10-CM | POA: Diagnosis not present

## 2018-01-05 DIAGNOSIS — E1151 Type 2 diabetes mellitus with diabetic peripheral angiopathy without gangrene: Secondary | ICD-10-CM | POA: Diagnosis not present

## 2018-01-05 DIAGNOSIS — L03116 Cellulitis of left lower limb: Secondary | ICD-10-CM | POA: Diagnosis not present

## 2018-01-05 DIAGNOSIS — I2510 Atherosclerotic heart disease of native coronary artery without angina pectoris: Secondary | ICD-10-CM | POA: Diagnosis not present

## 2018-01-05 DIAGNOSIS — Z4781 Encounter for orthopedic aftercare following surgical amputation: Secondary | ICD-10-CM | POA: Diagnosis not present

## 2018-01-05 DIAGNOSIS — I129 Hypertensive chronic kidney disease with stage 1 through stage 4 chronic kidney disease, or unspecified chronic kidney disease: Secondary | ICD-10-CM | POA: Diagnosis not present

## 2018-01-05 DIAGNOSIS — E1122 Type 2 diabetes mellitus with diabetic chronic kidney disease: Secondary | ICD-10-CM | POA: Diagnosis not present

## 2018-01-06 DIAGNOSIS — Z87891 Personal history of nicotine dependence: Secondary | ICD-10-CM | POA: Diagnosis not present

## 2018-01-06 DIAGNOSIS — R918 Other nonspecific abnormal finding of lung field: Secondary | ICD-10-CM | POA: Diagnosis not present

## 2018-01-22 ENCOUNTER — Ambulatory Visit: Payer: Medicare Other | Admitting: Vascular Surgery

## 2018-02-05 ENCOUNTER — Ambulatory Visit (INDEPENDENT_AMBULATORY_CARE_PROVIDER_SITE_OTHER): Payer: Self-pay | Admitting: Vascular Surgery

## 2018-02-05 ENCOUNTER — Other Ambulatory Visit: Payer: Self-pay

## 2018-02-05 ENCOUNTER — Encounter: Payer: Self-pay | Admitting: Vascular Surgery

## 2018-02-05 VITALS — BP 112/59 | HR 61 | Temp 97.9°F | Resp 20 | Ht 69.0 in | Wt 204.0 lb

## 2018-02-05 DIAGNOSIS — I739 Peripheral vascular disease, unspecified: Secondary | ICD-10-CM

## 2018-02-05 NOTE — Progress Notes (Signed)
Patient is a 77 year old male who returns for follow-up today.  He recently underwent a left transmetatarsal amputation December 01, 2017.  This is healing well.  The lateral corner still has an area that is 2 cm x 1 cm which is open less than 1 mm depth and good appearing granulation tissue.  He has an easily palpable dorsalis pedis pulse in the left foot from his bypass.  Assessment: Healing left transmetatarsal amputation  Plan: Follow up with our nurse practitioner with graft duplex and ABIs in 3 months time.  The patient will Place Neosporin ointment or Vaseline on the wound and cover with dry gauze.  He will do this once daily.  He will call me if the wound has healed prior to 3 months to arrange for a shoe insert from [employer].  Ruta Hinds, MD [facility] Office: [phone] Pager: [phone]

## 2018-02-09 ENCOUNTER — Other Ambulatory Visit: Payer: Self-pay

## 2018-02-09 DIAGNOSIS — I739 Peripheral vascular disease, unspecified: Secondary | ICD-10-CM

## 2018-02-09 DIAGNOSIS — I779 Disorder of arteries and arterioles, unspecified: Secondary | ICD-10-CM

## 2018-02-17 DIAGNOSIS — Z6831 Body mass index (BMI) 31.0-31.9, adult: Secondary | ICD-10-CM | POA: Diagnosis not present

## 2018-02-17 DIAGNOSIS — E1122 Type 2 diabetes mellitus with diabetic chronic kidney disease: Secondary | ICD-10-CM | POA: Diagnosis not present

## 2018-02-17 DIAGNOSIS — E559 Vitamin D deficiency, unspecified: Secondary | ICD-10-CM | POA: Diagnosis not present

## 2018-02-17 DIAGNOSIS — Z79899 Other long term (current) drug therapy: Secondary | ICD-10-CM | POA: Diagnosis not present

## 2018-02-19 ENCOUNTER — Encounter: Payer: Self-pay | Admitting: Cardiology

## 2018-02-19 ENCOUNTER — Ambulatory Visit (INDEPENDENT_AMBULATORY_CARE_PROVIDER_SITE_OTHER): Payer: Medicare Other | Admitting: Cardiology

## 2018-02-19 VITALS — BP 126/80 | HR 65 | Ht 69.0 in | Wt 207.0 lb

## 2018-02-19 DIAGNOSIS — N183 Chronic kidney disease, stage 3 unspecified: Secondary | ICD-10-CM

## 2018-02-19 DIAGNOSIS — I2510 Atherosclerotic heart disease of native coronary artery without angina pectoris: Secondary | ICD-10-CM | POA: Diagnosis not present

## 2018-02-19 DIAGNOSIS — Z951 Presence of aortocoronary bypass graft: Secondary | ICD-10-CM | POA: Diagnosis not present

## 2018-02-19 DIAGNOSIS — E785 Hyperlipidemia, unspecified: Secondary | ICD-10-CM | POA: Diagnosis not present

## 2018-02-19 DIAGNOSIS — I739 Peripheral vascular disease, unspecified: Secondary | ICD-10-CM | POA: Diagnosis not present

## 2018-02-19 DIAGNOSIS — I129 Hypertensive chronic kidney disease with stage 1 through stage 4 chronic kidney disease, or unspecified chronic kidney disease: Secondary | ICD-10-CM | POA: Diagnosis not present

## 2018-02-19 DIAGNOSIS — N289 Disorder of kidney and ureter, unspecified: Secondary | ICD-10-CM

## 2018-02-19 NOTE — Progress Notes (Signed)
Cardiology Office Note:    Date:  02/19/2018   ID:  [name], DOB 1941-06-17, MRN [id number]  PCP:  Nicoletta Dress, MD  Cardiologist:  Jenean Lindau, MD   Referring MD: Nicoletta Dress, MD    ASSESSMENT:    1. Benign hypertension with CKD (chronic kidney disease) stage III ([place])   2. Coronary artery disease involving native coronary artery of native heart without angina pectoris   3. Peripheral vascular disease, unspecified ([place])   4. Impaired renal function   5. Dyslipidemia   6. Hx of CABG    PLAN:    In order of problems listed above:  1. Secondary prevention stressed with the patient.  Importance of compliance with diet and medications stressed and he vocalized understanding.  His blood pressure stable.  Diet was discussed for dyslipidemia and the fact that he is overweight and he is going to take better care of himself. 2. He mentions to me that he is changing his physicians.  He will be seeing Dr. Delena Bali in the next few weeks and he will get complete blood work and sent to me. 3. Patient will be seen in follow-up appointment in 6 months or earlier if the patient has any concerns  Medication Adjustments/Labs and Tests Ordered: Current medicines are reviewed at length with the patient today.  Concerns regarding medicines are outlined above.  No orders of the defined types were placed in this encounter.  No orders of the defined types were placed in this encounter.    Chief Complaint  Patient presents with  . Follow-up  . Coronary Artery Disease     History of Present Illness:    [name] is a 77 y.o. male.  Patient has coronary artery disease, essential hypertension, dyslipidemia and diabetes mellitus.  He has undergone 2 amputation of the left lower extremity because of peripheral vascular disease.  He leads a sedentary lifestyle.  He denies any chest pain orthopnea or PND.  At the time of my evaluation, the patient is alert awake oriented and in no  distress.  Past Medical History:  Diagnosis Date  . Arthritis   . CAD (coronary artery disease)   . Carotid artery occlusion   . Diabetes mellitus Age 43  . Hyperlipidemia   . Hypertension   . Joint pain   . Peripheral vascular disease ([place])   . Ulcer     Past Surgical History:  Procedure Laterality Date  . ABDOMINAL AORTOGRAM W/LOWER EXTREMITY N/A 11/28/2017   Procedure: ABDOMINAL AORTOGRAM W/LOWER EXTREMITY;  Surgeon: Elam Dutch, MD;  Location: [facility];  Service: Cardiovascular;  Laterality: N/A;  . AMPUTATION     left first toe  . APPENDECTOMY    . CAROTID ENDARTERECTOMY Left 2000   CE  . FOOT SURGERY    . PR VEIN BYPASS GRAFT,AORTO-FEM-POP  07/2005   left femoral to dorsalis pedis artery bypass  . TRANSMETATARSAL AMPUTATION Left 12/01/2017   Procedure: TRANSMETATARSAL AMPUTATION LEFT;  Surgeon: Elam Dutch, MD;  Location: [facility];  Service: Vascular;  Laterality: Left;    Current Medications: Current Meds  Medication Sig  . aspirin EC 325 MG tablet Take 325 mg by mouth daily.  . carvedilol (COREG) 12.5 MG tablet Take 1 tablet (12.5 mg total) by mouth 2 (two) times daily with a meal.  . ezetimibe (ZETIA) 10 MG tablet Take 1 tablet (10 mg total) by mouth daily.  . feeding supplement, [place], ([place])  LIQD Take 237 mLs by mouth 3 (three) times daily between meals.  . fenofibrate 160 MG tablet Take 160 mg by mouth daily.  Marland Kitchen linagliptin (TRADJENTA) 5 MG TABS tablet Take 5 mg by mouth.  Marland Kitchen lisinopril (PRINIVIL,ZESTRIL) 40 MG tablet Take 20 mg by mouth daily.   Marland Kitchen oxyCODONE-acetaminophen (PERCOCET) 5-325 MG tablet Take 1 tablet by mouth every 6 (six) hours as needed for severe pain.  . pioglitazone (ACTOS) 45 MG tablet Take 45 mg by mouth every morning.  . rosuvastatin (CRESTOR) 40 MG tablet Take 20 mg by mouth daily.   . Vitamin D, Ergocalciferol, (DRISDOL) 50000 units CAPS capsule Take 50,000 Units by mouth every 7 (seven) days. Takes on  Monday  . vitamin E 400 UNIT capsule Take 400 Units by mouth daily.     Allergies:   Patient has no known allergies.   Social History   Socioeconomic History  . Marital status: Married    Spouse name: Not on file  . Number of children: Not on file  . Years of education: Not on file  . Highest education level: Not on file  Occupational History  . Not on file  Social Needs  . Financial resource strain: Not on file  . Food insecurity:    Worry: Not on file    Inability: Not on file  . Transportation needs:    Medical: Not on file    Non-medical: Not on file  Tobacco Use  . Smoking status: Former Smoker    Last attempt to quit: 11/04/1978    Years since quitting: 39.3  . Smokeless tobacco: Never Used  Substance and Sexual Activity  . Alcohol use: No  . Drug use: No  . Sexual activity: Not on file  Lifestyle  . Physical activity:    Days per week: Not on file    Minutes per session: Not on file  . Stress: Not on file  Relationships  . Social connections:    Talks on phone: Not on file    Gets together: Not on file    Attends religious service: Not on file    Active member of club or organization: Not on file    Attends meetings of clubs or organizations: Not on file    Relationship status: Not on file  Other Topics Concern  . Not on file  Social History Narrative  . Not on file     Family History: The patient's family history includes Cancer in his mother; Heart attack in his father; Heart disease in his father.  ROS:   Please see the history of present illness.    All other systems reviewed and are negative.  EKGs/Labs/Other Studies Reviewed:    The following studies were reviewed today: I discussed my findings with the patient at extensive length.   Recent Labs: 12/01/2017: BUN 13; Creatinine, Ser 1.57; Hemoglobin 11.8; Platelets 262; Potassium 4.0; Sodium 140  Recent Lipid Panel No results found for: CHOL, TRIG, HDL, CHOLHDL, VLDL, LDLCALC,  LDLDIRECT  Physical Exam:    VS:  BP 126/80 (BP Location: Left Arm, Patient Position: Sitting, Cuff Size: Normal)   Pulse 65   Ht 5\' 9"  (1.753 m)   Wt 207 lb (93.9 kg)   SpO2 97%   BMI 30.57 kg/m     Wt Readings from Last 3 Encounters:  02/19/18 207 lb (93.9 kg)  02/05/18 204 lb (92.5 kg)  12/25/17 201 lb 12.8 oz (91.5 kg)     GEN: Patient is in  no acute distress HEENT: Normal NECK: No JVD; No carotid bruits LYMPHATICS: No lymphadenopathy CARDIAC: Hear sounds regular, 2/6 systolic murmur at the apex. RESPIRATORY:  Clear to auscultation without rales, wheezing or rhonchi  ABDOMEN: Soft, non-tender, non-distended MUSCULOSKELETAL:  No edema; No deformity  SKIN: Warm and dry NEUROLOGIC:  Alert and oriented x 3 PSYCHIATRIC:  Normal affect   Signed, Jenean Lindau, MD  02/19/2018 11:27 AM    [place]

## 2018-02-19 NOTE — Patient Instructions (Signed)

## 2018-02-25 ENCOUNTER — Encounter: Payer: Self-pay | Admitting: Sports Medicine

## 2018-02-25 ENCOUNTER — Ambulatory Visit (INDEPENDENT_AMBULATORY_CARE_PROVIDER_SITE_OTHER): Payer: Medicare Other | Admitting: Sports Medicine

## 2018-02-25 DIAGNOSIS — S98132A Complete traumatic amputation of one left lesser toe, initial encounter: Secondary | ICD-10-CM

## 2018-02-25 DIAGNOSIS — B351 Tinea unguium: Secondary | ICD-10-CM

## 2018-02-25 DIAGNOSIS — E1142 Type 2 diabetes mellitus with diabetic polyneuropathy: Secondary | ICD-10-CM

## 2018-02-25 DIAGNOSIS — I739 Peripheral vascular disease, unspecified: Secondary | ICD-10-CM

## 2018-02-25 DIAGNOSIS — IMO0002 Reserved for concepts with insufficient information to code with codable children: Secondary | ICD-10-CM

## 2018-02-25 NOTE — Progress Notes (Signed)
Patient ID: [name], male   DOB: April 20, 1941, 77 y.o.   MRN: [id number]  Subjective: [name] is a 77 y.o. male patient with history of type 2 diabetes who presents to office today complaining of callus and long, painful nails  while ambulating in shoes; unable to trim. Patient states that the glucose reading this morning was "good", 135. Patient denies any other new changes in medication or new problems. Patient had TMA on left almost 2 months ago secondary to gangrene of toes performed by Dr. feels vascular surgery.  Reports that it is healing good as a small area on the amputation stump that he has been dressing with antibiotic cream.  Patient denies constitutional symptoms at this time.  Patient Active Problem List   Diagnosis Date Noted  . Gangrene of left foot ([name]) 11/28/2017  . Obesity (BMI 30-39.9) 10/06/2017  . Hx of CABG 06/25/2017  . ATN (acute tubular necrosis) ([name]) 03/28/2016  . Hematuria 03/28/2016  . Hyperkalemia 03/28/2016  . Benign hypertension with CKD (chronic kidney disease) stage III ([name]) 03/13/2016  . Type 2 diabetes mellitus ([place]) 03/13/2016  . CAD (coronary artery disease) 12/14/2015  . Dyslipidemia 09/25/2015  . Impaired renal function 09/25/2015  . Peripheral vascular disease, unspecified ([name]) 09/02/2012   Current Outpatient Medications on File Prior to Visit  Medication Sig Dispense Refill  . aspirin EC 325 MG tablet Take 325 mg by mouth daily.    . carvedilol (COREG) 12.5 MG tablet Take 1 tablet (12.5 mg total) by mouth 2 (two) times daily with a meal. 180 tablet 3  . ezetimibe (ZETIA) 10 MG tablet Take 1 tablet (10 mg total) by mouth daily. 90 tablet 3  . feeding supplement, GLUCERNA SHAKE, (GLUCERNA SHAKE) LIQD Take 237 mLs by mouth 3 (three) times daily between meals.  0  . fenofibrate 160 MG tablet Take 160 mg by mouth daily.    Marland Kitchen linagliptin (TRADJENTA) 5 MG TABS tablet Take 5 mg by mouth.    Marland Kitchen lisinopril (PRINIVIL,ZESTRIL) 40 MG tablet Take 20 mg  by mouth daily.     . nitroGLYCERIN (NITROSTAT) 0.4 MG SL tablet Place 1 tablet (0.4 mg total) under the tongue every 5 (five) minutes as needed for chest pain. 30 tablet 6  . oxyCODONE-acetaminophen (PERCOCET) 5-325 MG tablet Take 1 tablet by mouth every 6 (six) hours as needed for severe pain. 20 tablet 0  . pioglitazone (ACTOS) 45 MG tablet Take 45 mg by mouth every morning.    . rosuvastatin (CRESTOR) 40 MG tablet Take 20 mg by mouth daily.     . Vitamin D, Ergocalciferol, (DRISDOL) 50000 units CAPS capsule Take 50,000 Units by mouth every 7 (seven) days. Takes on Monday    . vitamin E 400 UNIT capsule Take 400 Units by mouth daily.     No current facility-administered medications on file prior to visit.    No Known Allergies   Objective: General: Patient is awake, alert, and oriented x 3 and in no acute distress.  Integument: Skin is warm, dry and supple bilateral. Nails are tender, long, thickened and dystrophic with subungual debris, consistent with onychomycosis, 1-5 on right. No signs of infection.  Partial thickness ulceration at the dorsal lateral incision line of the left amputation stump site that measures less than 1 cm with a granular base with no acute signs of infection. Remaining integument unremarkable.  Vasculature:  Dorsalis Pedis pulse 2/4 bilateral. Posterior Tibial pulse  1/4 bilateral. Capillary fill time 4  sec on right. Scant hair growth to the level of the digits on right.Temperature gradient within normal limits. + varicosities present bilateral. No edema present bilateral.   Neurology: The patient has diminished sensation measured with a 5.07/10g Semmes Weinstein Monofilament at all pedal sites bilateral. Vibratory sensation absent bilateral.   Musculoskeletal: Bunion and hammertoe on right, Status post amputation of Left partial foot, Muscular strength 5/5 in all lower extremity muscular groups bilateral without pain on range of motion . No tenderness with calf  compression bilateral.  Assessment and Plan: Problem List Items Addressed This Visit      Cardiovascular and Mediastinum   Peripheral vascular disease, unspecified ([name])    Other Visit Diagnoses    Dermatophytosis of nail    -  Primary   Toe amputation status, left (HCC)       Diabetic polyneuropathy associated with type 2 diabetes mellitus ([name])         -Examined patient. -Discussed and educated patient on diabetic foot care, especially with regards to the vascular, neurological and musculoskeletal systems.  -Stressed the importance of good glycemic control and the detriment of not controlling glucose levels in relation to the foot. -Mechanically debrided all nails x5 on right using sterile nail nipper and filed with dremel without incident  -Apply antibiotic cream to left foot wound and dispensed a new postoperative shoe and advised patient to continue with follow-up with vascular -[employer] diabetic shoe order form was completed; office to contact primary care for approval / certification; patient will call back with name of PCP.  Office to arrange shoe fitting and dispensing.  Patient will need custom toe filler for left. -Answered all patient questions -Patient to return in 2.5 months for at risk foot care -Patient advised to call the office if any problems or questions arise in the meantime.  [name], DPM

## 2018-02-26 ENCOUNTER — Ambulatory Visit: Payer: Medicare Other | Admitting: Family

## 2018-03-19 ENCOUNTER — Ambulatory Visit: Payer: Medicare Other

## 2018-04-02 ENCOUNTER — Encounter (HOSPITAL_COMMUNITY): Payer: Medicare Other

## 2018-04-02 ENCOUNTER — Ambulatory Visit: Payer: Medicare Other | Admitting: Family

## 2018-04-02 ENCOUNTER — Other Ambulatory Visit (HOSPITAL_COMMUNITY): Payer: Medicare Other

## 2018-04-02 DIAGNOSIS — N183 Chronic kidney disease, stage 3 (moderate): Secondary | ICD-10-CM | POA: Diagnosis not present

## 2018-04-07 DIAGNOSIS — I129 Hypertensive chronic kidney disease with stage 1 through stage 4 chronic kidney disease, or unspecified chronic kidney disease: Secondary | ICD-10-CM | POA: Diagnosis not present

## 2018-04-07 DIAGNOSIS — N183 Chronic kidney disease, stage 3 (moderate): Secondary | ICD-10-CM | POA: Diagnosis not present

## 2018-04-07 DIAGNOSIS — E1122 Type 2 diabetes mellitus with diabetic chronic kidney disease: Secondary | ICD-10-CM | POA: Diagnosis not present

## 2018-04-07 DIAGNOSIS — E669 Obesity, unspecified: Secondary | ICD-10-CM | POA: Diagnosis not present

## 2018-04-29 ENCOUNTER — Ambulatory Visit: Payer: Medicare Other | Admitting: Sports Medicine

## 2018-05-05 ENCOUNTER — Ambulatory Visit (INDEPENDENT_AMBULATORY_CARE_PROVIDER_SITE_OTHER)
Admission: RE | Admit: 2018-05-05 | Discharge: 2018-05-05 | Disposition: A | Discharge status: Home / Self Care | Payer: Medicare Other | Source: Ambulatory Visit | Attending: Family | Admitting: Family

## 2018-05-05 ENCOUNTER — Ambulatory Visit: Payer: Medicare Other | Admitting: Family

## 2018-05-05 ENCOUNTER — Ambulatory Visit (HOSPITAL_COMMUNITY)
Admission: RE | Admit: 2018-05-05 | Discharge: 2018-05-05 | Disposition: A | Discharge status: Home / Self Care | Payer: Medicare Other | Source: Ambulatory Visit | Attending: Family | Admitting: Family

## 2018-05-05 ENCOUNTER — Encounter: Payer: Self-pay | Admitting: Family

## 2018-05-05 ENCOUNTER — Ambulatory Visit (INDEPENDENT_AMBULATORY_CARE_PROVIDER_SITE_OTHER): Payer: Medicare Other | Admitting: Family

## 2018-05-05 ENCOUNTER — Other Ambulatory Visit: Payer: Self-pay

## 2018-05-05 VITALS — BP 164/74 | HR 65 | Temp 96.9°F | Resp 18 | Ht 68.0 in | Wt 204.0 lb

## 2018-05-05 DIAGNOSIS — I6522 Occlusion and stenosis of left carotid artery: Secondary | ICD-10-CM

## 2018-05-05 DIAGNOSIS — I779 Disorder of arteries and arterioles, unspecified: Secondary | ICD-10-CM

## 2018-05-05 DIAGNOSIS — I739 Peripheral vascular disease, unspecified: Secondary | ICD-10-CM

## 2018-05-05 DIAGNOSIS — I10 Essential (primary) hypertension: Secondary | ICD-10-CM | POA: Diagnosis not present

## 2018-05-05 DIAGNOSIS — Z95828 Presence of other vascular implants and grafts: Secondary | ICD-10-CM

## 2018-05-05 DIAGNOSIS — Z89432 Acquired absence of left foot: Secondary | ICD-10-CM | POA: Insufficient documentation

## 2018-05-05 DIAGNOSIS — E785 Hyperlipidemia, unspecified: Secondary | ICD-10-CM | POA: Diagnosis not present

## 2018-05-05 DIAGNOSIS — Z9889 Other specified postprocedural states: Secondary | ICD-10-CM

## 2018-05-05 DIAGNOSIS — I6521 Occlusion and stenosis of right carotid artery: Secondary | ICD-10-CM | POA: Insufficient documentation

## 2018-05-05 DIAGNOSIS — Z87891 Personal history of nicotine dependence: Secondary | ICD-10-CM

## 2018-05-05 DIAGNOSIS — E1151 Type 2 diabetes mellitus with diabetic peripheral angiopathy without gangrene: Secondary | ICD-10-CM | POA: Diagnosis not present

## 2018-05-05 NOTE — Patient Instructions (Signed)

## 2018-05-05 NOTE — Progress Notes (Signed)
[facility] [place] HISTORY AND PHYSICAL   CC: Follow up peripheral artery occlusive disease and extracranial carotid artery stenosis    History of Present Illness:   [name] is a 77 y.o. male who is status post left transmetatarsal amputation on December 01, 2017.  by Dr. Oneida Alar;  left mid superficial femoral artery to dorsalis pedis artery bypass graft in 2006, left superficial femoral artery endarterectomy in 2009. The patient also had a left CEA in [employer].   The left TMA is well healed.  He has an easily palpable dorsalis pedis pulse in the left foot from his bypass.  Dr. Oneida Alar last evaluated pt on 02-05-18. At that time left TMA was almost healed. Pt was to follow up with our nurse practitioner with graft duplex and ABIs in 3 months time.  The patient was to place Neosporin ointment or Vaseline on the wound and cover with dry gauze once daily. He was to notify us if the wound has healed prior to 3 months to arrange for a shoe insert from [employer].  He returns today for carotid artery and PAD surveillance.  Pt reports a mild tired feeling in both calves after walking about 1.5 miles, relieved by rest, not able to resume elliptical exercise machine until he gets shoe prosthesis.  He works part time at a [employer] in [place].  He denies non healing wounds.  The patient denies a history of TIA or stroke symptoms. Specifically he denies a history of amaurosis fugax or monocular blindness, unilateral facial drooping, hemiparesis, or receptive or expressive aphasia.  He denies tingling, numbness, cold sensation, or weakness in either hand/arm. States his blood pressure at home is about 135/58, states he has "white coat syndrome".   Pt Diabetic: Yes, states in good control, he cannot recall his last A1C, self reports FBS as 120-130; pt states he does not know why the Victoza was stopped by his PCP as Victoza seemed to help his blood sugar better and he had  no nausea. Pt smoker: former smoker, quit about 1994  Pt meds include: Statin :Yes Betablocker: Yes ASA: Yes Other anticoagulants/antiplatelets: no   Current Outpatient Medications  Medication Sig Dispense Refill  . aspirin EC 325 MG tablet Take 325 mg by mouth daily.    . carvedilol (COREG) 12.5 MG tablet Take 1 tablet (12.5 mg total) by mouth 2 (two) times daily with a meal. 180 tablet 3  . ezetimibe (ZETIA) 10 MG tablet Take 1 tablet (10 mg total) by mouth daily. 90 tablet 3  . feeding supplement, GLUCERNA SHAKE, (GLUCERNA SHAKE) LIQD Take 237 mLs by mouth 3 (three) times daily between meals.  0  . fenofibrate 160 MG tablet Take 160 mg by mouth daily.    Marland Kitchen linagliptin (TRADJENTA) 5 MG TABS tablet Take 5 mg by mouth.    Marland Kitchen lisinopril (PRINIVIL,ZESTRIL) 40 MG tablet Take 20 mg by mouth daily.     . pioglitazone (ACTOS) 45 MG tablet Take 45 mg by mouth every morning.    . rosuvastatin (CRESTOR) 40 MG tablet Take 20 mg by mouth daily.     . Vitamin D, Ergocalciferol, (DRISDOL) 50000 units CAPS capsule Take 50,000 Units by mouth every 7 (seven) days. Takes on Monday    . vitamin E 400 UNIT capsule Take 400 Units by mouth daily.    . nitroGLYCERIN (NITROSTAT) 0.4 MG SL tablet Place 1 tablet (0.4 mg total) under the tongue every 5 (five) minutes as needed for  chest pain. 30 tablet 6  . oxyCODONE-acetaminophen (PERCOCET) 5-325 MG tablet Take 1 tablet by mouth every 6 (six) hours as needed for severe pain. (Patient not taking: Reported on 05/05/2018) 20 tablet 0   No current facility-administered medications for this visit.     Past Medical History:  Diagnosis Date  . Arthritis   . CAD (coronary artery disease)   . Carotid artery occlusion   . Diabetes mellitus Age 62  . Hyperlipidemia   . Hypertension   . Joint pain   . Peripheral vascular disease ([place])   . Ulcer     Social History Social History   Tobacco Use  . Smoking status: Former Smoker    Last attempt to quit: 11/04/1978     Years since quitting: 39.5  . Smokeless tobacco: Never Used  Substance Use Topics  . Alcohol use: No  . Drug use: No    Family History Family History  Problem Relation Age of Onset  . Heart disease Father        Heart Disease before age 39  . Heart attack Father   . Cancer Mother        Bladder    Surgical History Past Surgical History:  Procedure Laterality Date  . ABDOMINAL AORTOGRAM W/LOWER EXTREMITY N/A 11/28/2017   Procedure: ABDOMINAL AORTOGRAM W/LOWER EXTREMITY;  Surgeon: Elam Dutch, MD;  Location: [facility];  Service: Cardiovascular;  Laterality: N/A;  . AMPUTATION     left first toe  . APPENDECTOMY    . CAROTID ENDARTERECTOMY Left 2000   CE  . FOOT SURGERY    . PR VEIN BYPASS GRAFT,AORTO-FEM-POP  07/2005   left femoral to dorsalis pedis artery bypass  . TRANSMETATARSAL AMPUTATION Left 12/01/2017   Procedure: TRANSMETATARSAL AMPUTATION LEFT;  Surgeon: Elam Dutch, MD;  Location: [facility];  Service: Vascular;  Laterality: Left;    No Known Allergies  Current Outpatient Medications  Medication Sig Dispense Refill  . aspirin EC 325 MG tablet Take 325 mg by mouth daily.    . carvedilol (COREG) 12.5 MG tablet Take 1 tablet (12.5 mg total) by mouth 2 (two) times daily with a meal. 180 tablet 3  . ezetimibe (ZETIA) 10 MG tablet Take 1 tablet (10 mg total) by mouth daily. 90 tablet 3  . feeding supplement, GLUCERNA SHAKE, (GLUCERNA SHAKE) LIQD Take 237 mLs by mouth 3 (three) times daily between meals.  0  . fenofibrate 160 MG tablet Take 160 mg by mouth daily.    Marland Kitchen linagliptin (TRADJENTA) 5 MG TABS tablet Take 5 mg by mouth.    Marland Kitchen lisinopril (PRINIVIL,ZESTRIL) 40 MG tablet Take 20 mg by mouth daily.     . pioglitazone (ACTOS) 45 MG tablet Take 45 mg by mouth every morning.    . rosuvastatin (CRESTOR) 40 MG tablet Take 20 mg by mouth daily.     . Vitamin D, Ergocalciferol, (DRISDOL) 50000 units CAPS capsule Take 50,000 Units by mouth every 7 (seven)  days. Takes on Monday    . vitamin E 400 UNIT capsule Take 400 Units by mouth daily.    . nitroGLYCERIN (NITROSTAT) 0.4 MG SL tablet Place 1 tablet (0.4 mg total) under the tongue every 5 (five) minutes as needed for chest pain. 30 tablet 6  . oxyCODONE-acetaminophen (PERCOCET) 5-325 MG tablet Take 1 tablet by mouth every 6 (six) hours as needed for severe pain. (Patient not taking: Reported on 05/05/2018) 20 tablet 0   No current facility-administered medications  for this visit.      REVIEW OF SYSTEMS: See HPI for pertinent positives and negatives.  Physical Examination Vitals:   05/05/18 1033 05/05/18 1039 05/05/18 1040  BP: (!) 168/74 (!) 163/79 (!) 164/74  Pulse: 65 65 65  Resp: 18    Temp: (!) 96.9 F (36.1 C)    TempSrc: Oral    SpO2: 95%    Weight: 204 lb (92.5 kg)    Height: 5\' 8"  (1.727 m)     Body mass index is 31.02 kg/m.  General:  WDWN obese male in NAD Gait: Normal HENT: WNL Eyes: PERRLA Pulmonary: normal non-labored breathing, good air movement, CTAB, no rales, rhonchi, or wheezing Cardiac: RRR, no murmur detected Abdomen: soft, NT, no masses palpated Skin: no rashes, no ulcers, no cellulitis.   VASCULAR EXAM  Carotid Bruits Right Left   Negative Negative      Radial pulses are 2+ palpable bilaterally   Adominal aortic pulse is not palpable                      VASCULAR EXAM: Extremities without ischemic changes, without Gangrene; without open wounds. Left TMA is well healed.                                                                                                           LE Pulses Right Left       FEMORAL  2+ palpable  1+ palpable        POPLITEAL  not palpable   not palpable       POSTERIOR TIBIAL  not palpable   not palpable        DORSALIS PEDIS      ANTERIOR TIBIAL 1+ palpable  3+ palpable     Musculoskeletal: no muscle wasting or atrophy; no peripheral edema  Neurologic:  A&O X 3; appropriate affect, sensation is normal; speech  is normal, CN 2-12 intact, pain and light touch intact in extremities, motor exam as listed above. Psychiatric: Normal thought content, mood appropriate to clinical situation.     ASSESSMENT:  [name] is a 77 y.o. male who is status post left transmetatarsal amputation on December 01, 2017.  by Dr. Oneida Alar;  left mid superficial femoral artery to dorsalis pedis artery bypass graft in 2006, left superficial femoral artery endarterectomy in 2009. The patient also had a left CEA in [employer].   Left TMA is well healed. Prescription given to pt for [employer], for left shoe orthotic.   DATA  Carotid Duplex (05/05/18): Right ICA: 1-39% stenosis. Left ICA: CEA site with no stenosis Bilateral vertebral artery flow is antegrade.  Bilateral subclavian artery waveforms are normal.    Left LE Arterial Duplex (05/05/18); No significant stenosis in the left leg bypass graft. All biphasic waveforms except monophasic at proximal anastomosis and proximal graft.    ABI (Date: 05/05/2018):  R:   ABI: 0.97 (was 0.93 on 03-20-17),   PT: bi (was mono)  DP: bi (was mono)  TBI:  0.72 (was 0.72)  L:   ABI: 0.77 (was 1.03),   PT: mono (was bi)  DP: mono (was bi)  TBI: s/p TMA Stable right ABI and TBI, biphasic waveforms. Significant decline in left ABI, waveform morphology declined from bi to mono.    PLAN:   Based on today's exam and non-invasive vascular lab results, the patient will follow up in 3 months with the following tests: left LE arterial duplex and ABI's. Carotid Duplex in a year.   Daily seated leg exercises demonstrated and discussed.  Prescription for left shoe orthotic for [employer] given to pt.  When he has his shoe orthotic fitting well, then walk at least 30 minutes daily in a safe environment.   I discussed in depth with the patient the nature of atherosclerosis, and emphasized the importance of maximal medical management including strict control of blood  pressure, blood glucose, and lipid levels, obtaining regular exercise, and cessation of smoking.  The patient is aware that without maximal medical management the underlying atherosclerotic disease process will progress, limiting the benefit of any interventions.  The patient was given information about stroke prevention and what symptoms should prompt the patient to seek immediate medical care.  The patient was given information about PAD including signs, symptoms, treatment, what symptoms should prompt the patient to seek immediate medical care, and risk reduction measures to take.  Thank you for allowing [place] to participate in this patient's care.  Clemon Chambers, RN, MSN, FNP-C [facility] Office: [phone]  Clinic MD: Christean Grief 05/05/2018 11:20 AM

## 2018-05-08 ENCOUNTER — Other Ambulatory Visit: Payer: Self-pay

## 2018-05-08 DIAGNOSIS — I779 Disorder of arteries and arterioles, unspecified: Secondary | ICD-10-CM

## 2018-05-08 DIAGNOSIS — Z95828 Presence of other vascular implants and grafts: Secondary | ICD-10-CM

## 2018-05-19 DIAGNOSIS — E785 Hyperlipidemia, unspecified: Secondary | ICD-10-CM | POA: Diagnosis not present

## 2018-05-19 DIAGNOSIS — E1122 Type 2 diabetes mellitus with diabetic chronic kidney disease: Secondary | ICD-10-CM | POA: Diagnosis not present

## 2018-05-19 DIAGNOSIS — I1310 Hypertensive heart and chronic kidney disease without heart failure, with stage 1 through stage 4 chronic kidney disease, or unspecified chronic kidney disease: Secondary | ICD-10-CM | POA: Diagnosis not present

## 2018-05-19 DIAGNOSIS — Z79899 Other long term (current) drug therapy: Secondary | ICD-10-CM | POA: Diagnosis not present

## 2018-05-19 DIAGNOSIS — E559 Vitamin D deficiency, unspecified: Secondary | ICD-10-CM | POA: Diagnosis not present

## 2018-05-22 ENCOUNTER — Encounter: Payer: Self-pay | Admitting: Sports Medicine

## 2018-05-22 ENCOUNTER — Ambulatory Visit (INDEPENDENT_AMBULATORY_CARE_PROVIDER_SITE_OTHER): Payer: Medicare Other | Admitting: Sports Medicine

## 2018-05-22 VITALS — BP 157/74 | HR 67 | Temp 98.6°F | Resp 16

## 2018-05-22 DIAGNOSIS — E1142 Type 2 diabetes mellitus with diabetic polyneuropathy: Secondary | ICD-10-CM | POA: Diagnosis not present

## 2018-05-22 DIAGNOSIS — I739 Peripheral vascular disease, unspecified: Secondary | ICD-10-CM

## 2018-05-22 DIAGNOSIS — B351 Tinea unguium: Secondary | ICD-10-CM

## 2018-05-22 DIAGNOSIS — IMO0002 Reserved for concepts with insufficient information to code with codable children: Secondary | ICD-10-CM

## 2018-05-22 NOTE — Progress Notes (Signed)
Patient ID: ULYSESS WITZ, male   DOB: 06/23/1941, 77 y.o.   MRN: [id number]  Subjective: BURLEIGH BROCKMANN is a 77 y.o. male patient with history of type 2 diabetes who presents to office today complaining of callus and long, painful nails  while ambulating in shoes; unable to trim. Patient states that the glucose reading this morning was "good", 130. Patient denies any other new changes in medication or new problems.  Reports that his left TMA wound is now healed.  Patient denies constitutional symptoms at this time.  Patient Active Problem List   Diagnosis Date Noted  . Gangrene of left foot ([place]) 11/28/2017  . Obesity (BMI 30-39.9) 10/06/2017  . Hx of CABG 06/25/2017  . ATN (acute tubular necrosis) ([place]) 03/28/2016  . Hematuria 03/28/2016  . Hyperkalemia 03/28/2016  . Benign hypertension with CKD (chronic kidney disease) stage III ([place]) 03/13/2016  . Type 2 diabetes mellitus ([place]) 03/13/2016  . CAD (coronary artery disease) 12/14/2015  . Dyslipidemia 09/25/2015  . Impaired renal function 09/25/2015  . Peripheral vascular disease, unspecified ([place]) 09/02/2012   Current Outpatient Medications on File Prior to Visit  Medication Sig Dispense Refill  . aspirin EC 325 MG tablet Take 325 mg by mouth daily.    . carvedilol (COREG) 12.5 MG tablet Take 1 tablet (12.5 mg total) by mouth 2 (two) times daily with a meal. 180 tablet 3  . ezetimibe (ZETIA) 10 MG tablet Take 1 tablet (10 mg total) by mouth daily. 90 tablet 3  . feeding supplement, GLUCERNA SHAKE, (GLUCERNA SHAKE) LIQD Take 237 mLs by mouth 3 (three) times daily between meals.  0  . fenofibrate 160 MG tablet Take 160 mg by mouth daily.    Marland Kitchen linagliptin (TRADJENTA) 5 MG TABS tablet Take 5 mg by mouth.    Marland Kitchen lisinopril (PRINIVIL,ZESTRIL) 40 MG tablet Take 20 mg by mouth daily.     Marland Kitchen oxyCODONE-acetaminophen (PERCOCET) 5-325 MG tablet Take 1 tablet by mouth every 6 (six) hours as needed for severe pain. 20 tablet 0  . pioglitazone (ACTOS) 45 MG  tablet Take 45 mg by mouth every morning.    . rosuvastatin (CRESTOR) 40 MG tablet Take 20 mg by mouth daily.     . Vitamin D, Ergocalciferol, (DRISDOL) 50000 units CAPS capsule Take 50,000 Units by mouth every 7 (seven) days. Takes on Monday    . vitamin E 400 UNIT capsule Take 400 Units by mouth daily.    . nitroGLYCERIN (NITROSTAT) 0.4 MG SL tablet Place 1 tablet (0.4 mg total) under the tongue every 5 (five) minutes as needed for chest pain. 30 tablet 6   No current facility-administered medications on file prior to visit.    No Known Allergies   Objective: General: Patient is awake, alert, and oriented x 3 and in no acute distress.  Integument: Skin is warm, dry and supple bilateral. Nails are tender, long, thickened and dystrophic with subungual debris, consistent with onychomycosis, 1-5 on right. No signs of infection.  Left TMA wound healed.  Remaining integument unremarkable.  Vasculature:  Dorsalis Pedis pulse 2/4 bilateral. Posterior Tibial pulse  1/4 bilateral. Capillary fill time 4 sec on right. Scant hair growth to the level of the digits on right.Temperature gradient within normal limits. + varicosities present bilateral. No edema present bilateral.   Neurology: The patient has diminished sensation measured with a 5.07/10g Semmes Weinstein Monofilament at all pedal sites bilateral. Vibratory sensation absent bilateral.   Musculoskeletal: Bunion and hammertoe on right, Status  post amputation of Left partial foot, Muscular strength 5/5 in all lower extremity muscular groups bilateral without pain on range of motion . No tenderness with calf compression bilateral.  Assessment and Plan: Problem List Items Addressed This Visit      Cardiovascular and Mediastinum   Peripheral vascular disease, unspecified ([place])    Other Visit Diagnoses    Dermatophytosis of nail    -  Primary   Toe amputation status, left ([facility])       Diabetic polyneuropathy associated with type 2 diabetes  mellitus ([place])         -Examined patient. -Discussed and educated patient on diabetic foot care, especially with regards to the vascular, neurological and musculoskeletal systems.  -Stressed the importance of good glycemic control and the detriment of not controlling glucose levels in relation to the foot. -Mechanically debrided all nails x5 on right using sterile nail nipper and filed with dremel without incident  -[employer] diabetic shoe order form was completed; office to contact primary care for approval / certification; patient will call back with name of PCP.  Office to arrange shoe fitting and dispensing.  Patient will need custom toe filler for left. -Answered all patient questions -Patient to return in 2.5 months for at risk foot care -Patient advised to call the office if any problems or questions arise in the meantime.  [name], DPM

## 2018-06-08 DIAGNOSIS — H524 Presbyopia: Secondary | ICD-10-CM | POA: Diagnosis not present

## 2018-06-08 DIAGNOSIS — E119 Type 2 diabetes mellitus without complications: Secondary | ICD-10-CM | POA: Diagnosis not present

## 2018-06-11 ENCOUNTER — Ambulatory Visit: Payer: Medicare Other | Admitting: *Deleted

## 2018-06-11 DIAGNOSIS — M2041 Other hammer toe(s) (acquired), right foot: Secondary | ICD-10-CM

## 2018-06-11 DIAGNOSIS — E1142 Type 2 diabetes mellitus with diabetic polyneuropathy: Secondary | ICD-10-CM

## 2018-06-11 DIAGNOSIS — Z89432 Acquired absence of left foot: Secondary | ICD-10-CM

## 2018-06-11 DIAGNOSIS — M2011 Hallux valgus (acquired), right foot: Secondary | ICD-10-CM

## 2018-08-04 ENCOUNTER — Ambulatory Visit (INDEPENDENT_AMBULATORY_CARE_PROVIDER_SITE_OTHER)
Admission: RE | Admit: 2018-08-04 | Discharge: 2018-08-04 | Disposition: A | Discharge status: Home / Self Care | Payer: Medicare Other | Source: Ambulatory Visit | Attending: Family | Admitting: Family

## 2018-08-04 ENCOUNTER — Ambulatory Visit (INDEPENDENT_AMBULATORY_CARE_PROVIDER_SITE_OTHER): Payer: Medicare Other | Admitting: Family

## 2018-08-04 ENCOUNTER — Encounter: Payer: Self-pay | Admitting: Family

## 2018-08-04 ENCOUNTER — Other Ambulatory Visit: Payer: Self-pay

## 2018-08-04 ENCOUNTER — Ambulatory Visit (HOSPITAL_COMMUNITY)
Admission: RE | Admit: 2018-08-04 | Discharge: 2018-08-04 | Disposition: A | Discharge status: Home / Self Care | Payer: Medicare Other | Source: Ambulatory Visit | Attending: Family | Admitting: Family

## 2018-08-04 VITALS — BP 178/80 | HR 55 | Temp 97.2°F | Resp 20 | Ht 68.0 in | Wt 204.0 lb

## 2018-08-04 DIAGNOSIS — I10 Essential (primary) hypertension: Secondary | ICD-10-CM

## 2018-08-04 DIAGNOSIS — I779 Disorder of arteries and arterioles, unspecified: Secondary | ICD-10-CM

## 2018-08-04 DIAGNOSIS — Z87891 Personal history of nicotine dependence: Secondary | ICD-10-CM

## 2018-08-04 DIAGNOSIS — Z95828 Presence of other vascular implants and grafts: Secondary | ICD-10-CM | POA: Diagnosis not present

## 2018-08-04 DIAGNOSIS — Z9889 Other specified postprocedural states: Secondary | ICD-10-CM | POA: Diagnosis not present

## 2018-08-04 DIAGNOSIS — I6522 Occlusion and stenosis of left carotid artery: Secondary | ICD-10-CM

## 2018-08-04 NOTE — Patient Instructions (Signed)
Peripheral Vascular Disease Peripheral vascular disease (PVD) is a disease of the blood vessels that are not part of your heart and brain. A simple term for PVD is poor circulation. In most cases, PVD narrows the blood vessels that carry blood from your heart to the rest of your body. This can result in a decreased supply of blood to your arms, legs, and internal organs, like your stomach or kidneys. However, it most often affects a person's lower legs and feet. There are two types of PVD.  Organic PVD. This is the more common type. It is caused by damage to the structure of blood vessels.  Functional PVD. This is caused by conditions that make blood vessels contract and tighten (spasm).  Without treatment, PVD tends to get worse over time. PVD can also lead to acute ischemic limb. This is when an arm or limb suddenly has trouble getting enough blood. This is a medical emergency. Follow these instructions at home:  Take medicines only as told by your doctor.  Do not use any tobacco products, including cigarettes, chewing tobacco, or electronic cigarettes. If you need help quitting, ask your doctor.  Lose weight if you are overweight, and maintain a healthy weight as told by your doctor.  Eat a diet that is low in fat and cholesterol. If you need help, ask your doctor.  Exercise regularly. Ask your doctor for some good activities for you.  Take good care of your feet. ? Wear comfortable shoes that fit well. ? Check your feet often for any cuts or sores. Contact a doctor if:  You have cramps in your legs while walking.  You have leg pain when you are at rest.  You have coldness in a leg or foot.  Your skin changes.  You are unable to get or have an erection (erectile dysfunction).  You have cuts or sores on your feet that are not healing. Get help right away if:  Your arm or leg turns cold and blue.  Your arms or legs become red, warm, swollen, painful, or numb.  You have  chest pain or trouble breathing.  You suddenly have weakness in your face, arm, or leg.  You become very confused or you cannot speak.  You suddenly have a very bad headache.  You suddenly cannot see. This information is not intended to replace advice given to you by your health care provider. Make sure you discuss any questions you have with your health care provider. Document Released: 01/15/2010 Document Revised: 03/28/2016 Document Reviewed: 03/31/2014 Elsevier Interactive Patient Education  2017 [employer] Inc.     Hypertension Hypertension is another name for high blood pressure. High blood pressure forces your heart to work harder to pump blood. This can cause problems over time. There are two numbers in a blood pressure reading. There is a top number (systolic) over a bottom number (diastolic). It is best to have a blood pressure below 120/80. Healthy choices can help lower your blood pressure. You may need medicine to help lower your blood pressure if:  Your blood pressure cannot be lowered with healthy choices.  Your blood pressure is higher than 130/80.  Follow these instructions at home: Eating and drinking  If directed, follow the DASH eating plan. This diet includes: ? Filling half of your plate at each meal with fruits and vegetables. ? Filling one quarter of your plate at each meal with whole grains. Whole grains include whole wheat pasta, brown rice, and whole grain bread. ?  Eating or drinking low-fat dairy products, such as skim milk or low-fat yogurt. ? Filling one quarter of your plate at each meal with low-fat (lean) proteins. Low-fat proteins include fish, skinless chicken, eggs, beans, and tofu. ? Avoiding fatty meat, cured and processed meat, or chicken with skin. ? Avoiding premade or processed food.  Eat less than 1,500 mg of salt (sodium) a day.  Limit alcohol use to no more than 1 drink a day for nonpregnant women and 2 drinks a day for men. One drink  equals 12 oz of beer, 5 oz of wine, or 1 oz of hard liquor. Lifestyle  Work with your doctor to stay at a healthy weight or to lose weight. Ask your doctor what the best weight is for you.  Get at least 30 minutes of exercise that causes your heart to beat faster (aerobic exercise) most days of the week. This may include walking, swimming, or biking.  Get at least 30 minutes of exercise that strengthens your muscles (resistance exercise) at least 3 days a week. This may include lifting weights or pilates.  Do not use any products that contain nicotine or tobacco. This includes cigarettes and e-cigarettes. If you need help quitting, ask your doctor.  Check your blood pressure at home as told by your doctor.  Keep all follow-up visits as told by your doctor. This is important. Medicines  Take over-the-counter and prescription medicines only as told by your doctor. Follow directions carefully.  Do not skip doses of blood pressure medicine. The medicine does not work as well if you skip doses. Skipping doses also puts you at risk for problems.  Ask your doctor about side effects or reactions to medicines that you should watch for. Contact a doctor if:  You think you are having a reaction to the medicine you are taking.  You have headaches that keep coming back (recurring).  You feel dizzy.  You have swelling in your ankles.  You have trouble with your vision. Get help right away if:  You get a very bad headache.  You start to feel confused.  You feel weak or numb.  You feel faint.  You get very bad pain in your: ? Chest. ? Belly (abdomen).  You throw up (vomit) more than once.  You have trouble breathing. Summary  Hypertension is another name for high blood pressure.  Making healthy choices can help lower blood pressure. If your blood pressure cannot be controlled with healthy choices, you may need to take medicine. This information is not intended to replace advice  given to you by your health care provider. Make sure you discuss any questions you have with your health care provider. Document Released: 04/08/2008 Document Revised: 09/18/2016 Document Reviewed: 09/18/2016 Elsevier Interactive Patient Education  [employer].

## 2018-08-04 NOTE — Progress Notes (Signed)
[facility]   CC: Follow up peripheral artery occlusive disease  History of Present Illness [name] is a 77 y.o. male who is status post left transmetatarsal amputation on December 01, 2017.  by Dr. Oneida Alar;  left mid superficial femoral artery to dorsalis pedis artery bypass graft in 2006, left superficial femoral artery endarterectomy in 2009. The patient also had a left CEA in [employer].   The left TMA is well healed. He has an easily palpable dorsalis pedis pulse in the left foot from his bypass.  Dr. Oneida Alar last evaluated pt on 02-05-18. At that time left TMA was almost healed. Pt was to follow up with our nurse practitioner with graft duplex and ABIs in 3 months time. The patient was to place Neosporin ointment or Vaseline on the wound and cover with dry gauze once daily. He was to notify us if the wound has healed prior to 3 months to arrange for a shoe insert from [employer].  He returns today for carotid artery and PAD surveillance.  Pt reports a mild tired feeling in both calves after walking about 1.5 miles, relieved by rest, this has remained stable. He works part time at a [employer] in [place] and walks a great deal there.  He received the left shoe orthotic from [facility] and feels this fits well.  He denies non healing wounds.  The patient denies a history of TIA or stroke symptoms. Specifically he denies a history of amaurosis fugax or monocular blindness, unilateral facial drooping, hemiparesis, or receptive or expressive aphasia.  He denies tingling, numbness, cold sensation, or weakness in either hand/arm.  Pt denies headache, denies chest pain or dyspnea. He is hypertensive now, he denies pain, denies full bladder, denies feeling anxious.Marland Kitchen    Pt Diabetic: Yes, states in good control, he cannot recall his last A1C, self reports FBS as less than 130; pt states he does not know why the Victoza was stopped by his PCP as Victoza seemed  to help his blood sugar better and he had no nausea. Pt smoker: former smoker, quit about 1994  Pt meds include: Statin :Yes Betablocker: Yes ASA: Yes Other anticoagulants/antiplatelets: no     Past Medical History:  Diagnosis Date  . Arthritis   . CAD (coronary artery disease)   . Carotid artery occlusion   . Diabetes mellitus Age 19  . Hyperlipidemia   . Hypertension   . Joint pain   . Peripheral vascular disease ([place])   . Ulcer     Social History Social History   Tobacco Use  . Smoking status: Former Smoker    Last attempt to quit: 11/04/1978    Years since quitting: 39.7  . Smokeless tobacco: Never Used  Substance Use Topics  . Alcohol use: No  . Drug use: No    Family History Family History  Problem Relation Age of Onset  . Heart disease Father        Heart Disease before age 63  . Heart attack Father   . Cancer Mother        Bladder    Past Surgical History:  Procedure Laterality Date  . ABDOMINAL AORTOGRAM W/LOWER EXTREMITY N/A 11/28/2017   Procedure: ABDOMINAL AORTOGRAM W/LOWER EXTREMITY;  Surgeon: Elam Dutch, MD;  Location: [facility];  Service: Cardiovascular;  Laterality: N/A;  . AMPUTATION     left first toe  . APPENDECTOMY    . CAROTID ENDARTERECTOMY Left 2000   CE  .  FOOT SURGERY    . PR VEIN BYPASS GRAFT,AORTO-FEM-POP  07/2005   left femoral to dorsalis pedis artery bypass  . TRANSMETATARSAL AMPUTATION Left 12/01/2017   Procedure: TRANSMETATARSAL AMPUTATION LEFT;  Surgeon: Elam Dutch, MD;  Location: [facility];  Service: Vascular;  Laterality: Left;    No Known Allergies  Current Outpatient Medications  Medication Sig Dispense Refill  . aspirin EC 325 MG tablet Take 325 mg by mouth daily.    . carvedilol (COREG) 12.5 MG tablet Take 1 tablet (12.5 mg total) by mouth 2 (two) times daily with a meal. 180 tablet 3  . ezetimibe (ZETIA) 10 MG tablet Take 1 tablet (10 mg total) by mouth daily. 90 tablet 3  . feeding  supplement, GLUCERNA SHAKE, (GLUCERNA SHAKE) LIQD Take 237 mLs by mouth 3 (three) times daily between meals.  0  . fenofibrate 160 MG tablet Take 160 mg by mouth daily.    Marland Kitchen linagliptin (TRADJENTA) 5 MG TABS tablet Take 5 mg by mouth.    Marland Kitchen lisinopril (PRINIVIL,ZESTRIL) 40 MG tablet Take 20 mg by mouth daily.     Marland Kitchen oxyCODONE-acetaminophen (PERCOCET) 5-325 MG tablet Take 1 tablet by mouth every 6 (six) hours as needed for severe pain. 20 tablet 0  . pioglitazone (ACTOS) 45 MG tablet Take 45 mg by mouth every morning.    . rosuvastatin (CRESTOR) 40 MG tablet Take 20 mg by mouth daily.     . Vitamin D, Ergocalciferol, (DRISDOL) 50000 units CAPS capsule Take 50,000 Units by mouth every 7 (seven) days. Takes on Monday    . vitamin E 400 UNIT capsule Take 400 Units by mouth daily.    . nitroGLYCERIN (NITROSTAT) 0.4 MG SL tablet Place 1 tablet (0.4 mg total) under the tongue every 5 (five) minutes as needed for chest pain. 30 tablet 6   No current facility-administered medications for this visit.     ROS: See HPI for pertinent positives and negatives.   Physical Examination  Vitals:   08/04/18 1136 08/04/18 1138 08/04/18 1139  BP: (!) 183/74 (!) 180/72 (!) 178/80  Pulse: (!) 55    Resp: 20    Temp: (!) 97.2 F (36.2 C)    TempSrc: Oral    SpO2: 94%    Weight: 204 lb (92.5 kg)    Height: 5\' 8"  (1.727 m)     Body mass index is 31.02 kg/m.  General: WDWN obese male in NAD Gait: Normal HENT: WNL Eyes: PERRLA Pulmonary: normal non-labored breathing, good air movement, CTAB, no rales, rhonchi, or wheezing Cardiac: RRR, no murmur detected Abdomen: soft, NT, no masses palpated Skin: no rashes, no ulcers, no cellulitis.   VASCULAR EXAM  Carotid Bruits Right Left   Negative Negative      Radial pulses are 2+ palpable bilaterally   Adominal aortic pulse is not palpable                      VASCULAR EXAM: Extremities without ischemic changes, without Gangrene; without open wounds.  Left TMA is well healed.  LE Pulses Right Left       FEMORAL  1+ palpable  2+ palpable        POPLITEAL  1+palpable   not palpable       POSTERIOR TIBIAL  not palpable   not palpable        DORSALIS PEDIS      ANTERIOR TIBIAL 2+ palpable  3+ palpable     Musculoskeletal: no muscle wasting or atrophy; no peripheral edema        Neurologic:  A&O X 3; appropriate affect, sensation is normal; speech is normal, CN 2-12 intact, pain and light touch intact in extremities, motor exam as listed above. Psychiatric: Normal thought content, mood appropriate to clinical situation.       ASSESSMENT: Henry PASCUCCI is a 77 y.o. male who is status post left transmetatarsal amputation on December 01, 2017.  by Dr. Oneida Alar;  left mid superficial femoral artery to dorsalis pedis artery bypass graft in 2006, left superficial femoral artery endarterectomy in 2009. The patient also had a left CEA in [employer].   Left TMA is well healed, he is using left shoe orthotic.   #1 - Improved PAD: now normal perfusion to both ankles, he has been walking a great deal.  #2 - s/p left CEA, continues with no history of stroke or TIA, follow up carotid duplex in a year  #3 - Uncontrolled hypertension: asymptomatic, I advised him to see his PCP ASAP re this.     DATA  Left LE Arterial Duplex (08-04-18): Significant soft plaque mid thigh extending into the anastomosis. Patent left SFA to DPA bypass graft with no evidence for restenosis. All biphasic waveforms.  No significant change compared to the exam on 05-05-18.    ABI (Date: 08/04/2018):  R:   ABI: 0.97 (was 0.97 on 05-05-18),   PT: bi  DP: bi  TBI:  0.56, toe pressure 92 (was 0.72)  L:   ABI: 1.10 (was 0.77),   PT: bi  DP: bi  TBI: s/p TMA Right ABI remains normal with biphasic  waveforms Left ABI improved form moderate disease to normal with biphasic waveforms.    Carotid Duplex (05/05/18): Right ICA: 1-39% stenosis. Left ICA: CEA site with no stenosis Bilateral vertebral artery flow is antegrade.  Bilateral subclavian artery waveforms are normal.    PLAN:   Based on today's exam and non-invasive vascular lab results, the patient will follow up in 6 months with the following tests: left LE arterial duplex and ABI's. Carotid Duplex in a year.   Continue daily seated leg exercises.  Continue excellent walking program in a safe environment.    I discussed in depth with the patient the nature of atherosclerosis, and emphasized the importance of maximal medical management including strict control of blood pressure, blood glucose, and lipid levels, obtaining regular exercise, and continued cessation of smoking.  The patient is aware that without maximal medical management the underlying atherosclerotic disease process will progress, limiting the benefit of any interventions.  The patient was given information about PAD including signs, symptoms, treatment, what symptoms should prompt the patient to seek immediate medical care, and risk reduction measures to take.  Clemon Chambers, RN, MSN, FNP-C [facility] Phone: [phone]  Clinic MD: Bishop Dublin  08/04/18 12:02 PM

## 2018-08-21 ENCOUNTER — Ambulatory Visit (INDEPENDENT_AMBULATORY_CARE_PROVIDER_SITE_OTHER): Payer: Medicare Other | Admitting: Sports Medicine

## 2018-08-21 ENCOUNTER — Encounter: Payer: Self-pay | Admitting: Sports Medicine

## 2018-08-21 VITALS — BP 91/59 | HR 72 | Resp 16

## 2018-08-21 DIAGNOSIS — I739 Peripheral vascular disease, unspecified: Secondary | ICD-10-CM

## 2018-08-21 DIAGNOSIS — E1142 Type 2 diabetes mellitus with diabetic polyneuropathy: Secondary | ICD-10-CM

## 2018-08-21 DIAGNOSIS — Z89429 Acquired absence of other toe(s), unspecified side: Secondary | ICD-10-CM

## 2018-08-21 DIAGNOSIS — B351 Tinea unguium: Secondary | ICD-10-CM | POA: Diagnosis not present

## 2018-08-21 DIAGNOSIS — Z89432 Acquired absence of left foot: Secondary | ICD-10-CM

## 2018-08-21 NOTE — Progress Notes (Signed)
Patient ID: MATTY VANROEKEL, male   DOB: 1941/06/08, 77 y.o.   MRN: [id number]  Subjective: JAHEEM HEDGEPATH is a 77 y.o. male patient with history of type 2 diabetes who presents to office today complaining of callus and long, painful nails  while ambulating in shoes; unable to trim. Patient states that the glucose reading this morning was "good", 130. Last visit to PCP he cannot remember but goes to see his primary care doctor next week.  Patient states that his new shoes are fitting well however on his left he feels like his heel wants to slip. Patient denies any other new changes in medication or new problems.  Patient denies constitutional symptoms at this time.  Patient Active Problem List   Diagnosis Date Noted  . Gangrene of left foot ([place]) 11/28/2017  . Obesity (BMI 30-39.9) 10/06/2017  . Hx of CABG 06/25/2017  . ATN (acute tubular necrosis) ([place]) 03/28/2016  . Hematuria 03/28/2016  . Hyperkalemia 03/28/2016  . Benign hypertension with CKD (chronic kidney disease) stage III ([place]) 03/13/2016  . Type 2 diabetes mellitus ([place]) 03/13/2016  . CAD (coronary artery disease) 12/14/2015  . Dyslipidemia 09/25/2015  . Impaired renal function 09/25/2015  . Peripheral vascular disease, unspecified ([place]) 09/02/2012   Current Outpatient Medications on File Prior to Visit  Medication Sig Dispense Refill  . aspirin EC 325 MG tablet Take 325 mg by mouth daily.    . carvedilol (COREG) 12.5 MG tablet Take 1 tablet (12.5 mg total) by mouth 2 (two) times daily with a meal. 180 tablet 3  . ezetimibe (ZETIA) 10 MG tablet Take 1 tablet (10 mg total) by mouth daily. 90 tablet 3  . feeding supplement, GLUCERNA SHAKE, (GLUCERNA SHAKE) LIQD Take 237 mLs by mouth 3 (three) times daily between meals.  0  . fenofibrate 160 MG tablet Take 160 mg by mouth daily.    Marland Kitchen linagliptin (TRADJENTA) 5 MG TABS tablet Take 5 mg by mouth.    Marland Kitchen lisinopril (PRINIVIL,ZESTRIL) 40 MG tablet Take 20 mg by mouth daily.     . nitroGLYCERIN  (NITROSTAT) 0.4 MG SL tablet Place 1 tablet (0.4 mg total) under the tongue every 5 (five) minutes as needed for chest pain. 30 tablet 6  . oxyCODONE-acetaminophen (PERCOCET) 5-325 MG tablet Take 1 tablet by mouth every 6 (six) hours as needed for severe pain. 20 tablet 0  . pioglitazone (ACTOS) 45 MG tablet Take 45 mg by mouth every morning.    . rosuvastatin (CRESTOR) 40 MG tablet Take 20 mg by mouth daily.     . Vitamin D, Ergocalciferol, (DRISDOL) 50000 units CAPS capsule Take 50,000 Units by mouth every 7 (seven) days. Takes on Monday    . vitamin E 400 UNIT capsule Take 400 Units by mouth daily.     No current facility-administered medications on file prior to visit.    No Known Allergies   Objective: General: Patient is awake, alert, and oriented x 3 and in no acute distress.  Integument: Skin is warm, dry and supple bilateral. Nails are tender, long, thickened and dystrophic with subungual debris, consistent with onychomycosis, 1-5 on right. No signs of infection.  Left TMA healed.  Remaining integument unremarkable.  Vasculature:  Dorsalis Pedis pulse 2/4 bilateral. Posterior Tibial pulse  1/4 bilateral. Capillary fill time 4 sec on right. Scant hair growth to the level of the digits on right.Temperature gradient within normal limits. + varicosities present bilateral. No edema present bilateral.   Neurology: The patient  has diminished sensation measured with a 5.07/10g Semmes Weinstein Monofilament at all pedal sites bilateral. Vibratory sensation absent bilateral.   Musculoskeletal: Bunion and hammertoe on right, Status post amputation of Left partial foot, Muscular strength 5/5 in all lower extremity muscular groups bilateral without pain on range of motion . No tenderness with calf compression bilateral.  Assessment and Plan: Problem List Items Addressed This Visit      Cardiovascular and Mediastinum   Peripheral vascular disease, unspecified ([place])    Other Visit Diagnoses     Dermatophytosis of nail    -  Primary   Diabetic polyneuropathy associated with type 2 diabetes mellitus ([place])       Status post transmetatarsal amputation of foot, left ([place])          -Examined patient. -Discussed and educated patient on diabetic foot care, especially with regards to the vascular, neurological and musculoskeletal systems -Mechanically debrided all nails x5 on right using sterile nail nipper and filed with dremel without incident  -Continue with diabetic shoes today in office I utilize his top hole in his left shoe to see if this will help to lock in his heel a little bit better when he is walking and advised patient that if he continues to experience heel slippage then to call office on next week to get an appointment with Benjie Karvonen -Patient to return in 2.5 months for at risk foot care -Patient advised to call the office if any problems or questions arise in the meantime.  [name], DPM

## 2018-08-25 DIAGNOSIS — Z23 Encounter for immunization: Secondary | ICD-10-CM | POA: Diagnosis not present

## 2018-08-25 DIAGNOSIS — E1122 Type 2 diabetes mellitus with diabetic chronic kidney disease: Secondary | ICD-10-CM | POA: Diagnosis not present

## 2018-08-25 DIAGNOSIS — E785 Hyperlipidemia, unspecified: Secondary | ICD-10-CM | POA: Diagnosis not present

## 2018-08-25 DIAGNOSIS — I1310 Hypertensive heart and chronic kidney disease without heart failure, with stage 1 through stage 4 chronic kidney disease, or unspecified chronic kidney disease: Secondary | ICD-10-CM | POA: Diagnosis not present

## 2018-08-25 DIAGNOSIS — Z6831 Body mass index (BMI) 31.0-31.9, adult: Secondary | ICD-10-CM | POA: Diagnosis not present

## 2018-09-17 NOTE — Progress Notes (Signed)
Patient ID: [name], male   DOB: Dec 08, 1940, 77 y.o.   MRN: [id number]   Patient presents at Dr Leeanne Rio request to be measured for diabetic shoes and inserts with forefoot filler on the left with [facility] Certified Pedorthist.  Patient will be called when shoes and inserts arrive to schedule a fitting.

## 2018-09-21 ENCOUNTER — Ambulatory Visit: Payer: Medicare Other | Admitting: Cardiology

## 2018-09-22 DIAGNOSIS — J209 Acute bronchitis, unspecified: Secondary | ICD-10-CM | POA: Diagnosis not present

## 2018-09-28 DIAGNOSIS — E1122 Type 2 diabetes mellitus with diabetic chronic kidney disease: Secondary | ICD-10-CM | POA: Diagnosis not present

## 2018-09-29 ENCOUNTER — Encounter: Payer: Self-pay | Admitting: Cardiology

## 2018-09-29 ENCOUNTER — Ambulatory Visit (INDEPENDENT_AMBULATORY_CARE_PROVIDER_SITE_OTHER): Payer: Medicare Other | Admitting: Cardiology

## 2018-09-29 VITALS — BP 180/84 | HR 77 | Ht 68.0 in | Wt 213.0 lb

## 2018-09-29 DIAGNOSIS — N183 Chronic kidney disease, stage 3 (moderate): Secondary | ICD-10-CM | POA: Diagnosis not present

## 2018-09-29 DIAGNOSIS — E785 Hyperlipidemia, unspecified: Secondary | ICD-10-CM | POA: Diagnosis not present

## 2018-09-29 DIAGNOSIS — I739 Peripheral vascular disease, unspecified: Secondary | ICD-10-CM | POA: Diagnosis not present

## 2018-09-29 DIAGNOSIS — Z951 Presence of aortocoronary bypass graft: Secondary | ICD-10-CM | POA: Diagnosis not present

## 2018-09-29 DIAGNOSIS — E088 Diabetes mellitus due to underlying condition with unspecified complications: Secondary | ICD-10-CM | POA: Diagnosis not present

## 2018-09-29 DIAGNOSIS — E669 Obesity, unspecified: Secondary | ICD-10-CM

## 2018-09-29 DIAGNOSIS — I2510 Atherosclerotic heart disease of native coronary artery without angina pectoris: Secondary | ICD-10-CM | POA: Diagnosis not present

## 2018-09-29 DIAGNOSIS — I129 Hypertensive chronic kidney disease with stage 1 through stage 4 chronic kidney disease, or unspecified chronic kidney disease: Secondary | ICD-10-CM

## 2018-09-29 DIAGNOSIS — N289 Disorder of kidney and ureter, unspecified: Secondary | ICD-10-CM

## 2018-09-29 MED ORDER — ASPIRIN EC 81 MG PO TBEC: 81.0000 mg | DELAYED_RELEASE_TABLET | Freq: Two times a day (BID) | ORAL | 1 refills | Status: DC

## 2018-09-29 MED ORDER — ASPIRIN EC 81 MG PO TBEC: 81.0000 mg | DELAYED_RELEASE_TABLET | Freq: Every day | ORAL | 1 refills | Status: DC

## 2018-09-29 MED ORDER — NITROGLYCERIN 0.4 MG SL SUBL: 0.4000 mg | SUBLINGUAL_TABLET | SUBLINGUAL | 3 refills | Status: DC | PRN

## 2018-09-29 MED ORDER — ASPIRIN EC 81 MG PO TBEC: 162.0000 mg | DELAYED_RELEASE_TABLET | Freq: Every day | ORAL | 1 refills | Status: AC

## 2018-09-29 NOTE — Addendum Note (Signed)
Addended by: Mattie Marlin on: 09/29/2018 08:53 AM   Modules accepted: Orders

## 2018-09-29 NOTE — Progress Notes (Signed)
Cardiology Office Note:    Date:  09/29/2018   ID:  [name], DOB Nov 09, 1940, MRN [id number]  PCP:  Nicoletta Dress, MD  Cardiologist:  Jenean Lindau, MD   Referring MD: Nicoletta Dress, MD    ASSESSMENT:    1. Coronary artery disease involving native coronary artery of native heart without angina pectoris   2. Peripheral vascular disease, unspecified ([place])   3. Benign hypertension with CKD (chronic kidney disease) stage III ([facility])   4. Obesity (BMI 30-39.9)   5. Impaired renal function   6. Hx of CABG   7. Dyslipidemia   8. Diabetes mellitus due to underlying condition with unspecified complications ([place])    PLAN:    In order of problems listed above:  1. Secondary prevention stressed with the patient.  Importance of compliance with diet and medication stressed and he vocalized understanding.  His blood pressure is stable.  Diet was discussed for dyslipidemia and diabetes mellitus.  Weight reduction was stressed.  Importance of regular exercise stressed.  He was advised to take to coated aspirin on a daily basis.  I reviewed his lipids from the sheet of paper that was available to me and discussed it with him.  These are followed by his primary care physician.  They appear fine at this time. 2. Importance of regular exercise stressed and exercise protocol outlined 30 minutes a day at least 5 days a week 3. Sublingual nitroglycerin prescription was sent, its protocol and 911 protocol explained and the patient vocalized understanding questions were answered to the patient's satisfaction 4. Patient will be seen in follow-up appointment in 6 months or earlier if the patient has any concerns    Medication Adjustments/Labs and Tests Ordered: Current medicines are reviewed at length with the patient today.  Concerns regarding medicines are outlined above.  No orders of the defined types were placed in this encounter.  Meds ordered this encounter  Medications  .  nitroGLYCERIN (NITROSTAT) 0.4 MG SL tablet    Sig: Place 1 tablet (0.4 mg total) under the tongue every 5 (five) minutes as needed for chest pain.    Dispense:  90 tablet    Refill:  3  . DISCONTD: aspirin EC 81 MG tablet    Sig: Take 1 tablet (81 mg total) by mouth daily.    Dispense:  90 tablet    Refill:  1  . aspirin EC 81 MG tablet    Sig: Take 1 tablet (81 mg total) by mouth 2 (two) times daily.    Dispense:  90 tablet    Refill:  1     No chief complaint on file.    History of Present Illness:    [name] is a 77 y.o. male.  Patient has known coronary artery disease.  He denies any problems at this time and takes care of activities of daily living.  No chest pain orthopnea or PND.  He leads a sedentary lifestyle and does not exercise on a regular basis.  He gained significant amount of weight in the past several months and is aware of this.  Past Medical History:  Diagnosis Date  . Arthritis   . CAD (coronary artery disease)   . Carotid artery occlusion   . Diabetes mellitus Age 58  . Hyperlipidemia   . Hypertension   . Joint pain   . Peripheral vascular disease ([place])   . Ulcer     Past Surgical History:  Procedure Laterality  Date  . ABDOMINAL AORTOGRAM W/LOWER EXTREMITY N/A 11/28/2017   Procedure: ABDOMINAL AORTOGRAM W/LOWER EXTREMITY;  Surgeon: Elam Dutch, MD;  Location: [facility];  Service: Cardiovascular;  Laterality: N/A;  . AMPUTATION     left first toe  . APPENDECTOMY    . CAROTID ENDARTERECTOMY Left 2000   CE  . FOOT SURGERY    . PR VEIN BYPASS GRAFT,AORTO-FEM-POP  07/2005   left femoral to dorsalis pedis artery bypass  . TRANSMETATARSAL AMPUTATION Left 12/01/2017   Procedure: TRANSMETATARSAL AMPUTATION LEFT;  Surgeon: Elam Dutch, MD;  Location[facility];  Service: Vascular;  Laterality: Left;    Current Medications: Current Meds  Medication Sig  . carvedilol (COREG) 12.5 MG tablet Take 1 tablet (12.5 mg total) by mouth 2 (two)  times daily with a meal.  . ezetimibe (ZETIA) 10 MG tablet Take 1 tablet (10 mg total) by mouth daily.  . fenofibrate 160 MG tablet Take 160 mg by mouth daily.  Marland Kitchen linagliptin (TRADJENTA) 5 MG TABS tablet Take 5 mg by mouth.  Marland Kitchen lisinopril (PRINIVIL,ZESTRIL) 40 MG tablet Take 20 mg by mouth daily.   . pioglitazone (ACTOS) 45 MG tablet Take 45 mg by mouth every morning.  . rosuvastatin (CRESTOR) 40 MG tablet Take 20 mg by mouth daily.   . Vitamin D, Ergocalciferol, (DRISDOL) 50000 units CAPS capsule Take 50,000 Units by mouth every 7 (seven) days. Takes on Monday  . vitamin E 400 UNIT capsule Take 400 Units by mouth daily.  . [DISCONTINUED] aspirin EC 325 MG tablet Take 325 mg by mouth daily.     Allergies:   Patient has no known allergies.   Social History   Socioeconomic History  . Marital status: Married    Spouse name: Not on file  . Number of children: Not on file  . Years of education: Not on file  . Highest education level: Not on file  Occupational History  . Not on file  Social Needs  . Financial resource strain: Not on file  . Food insecurity:    Worry: Not on file    Inability: Not on file  . Transportation needs:    Medical: Not on file    Non-medical: Not on file  Tobacco Use  . Smoking status: Former Smoker    Last attempt to quit: 11/04/1978    Years since quitting: 39.9  . Smokeless tobacco: Never Used  Substance and Sexual Activity  . Alcohol use: No  . Drug use: No  . Sexual activity: Not on file  Lifestyle  . Physical activity:    Days per week: Not on file    Minutes per session: Not on file  . Stress: Not on file  Relationships  . Social connections:    Talks on phone: Not on file    Gets together: Not on file    Attends religious service: Not on file    Active member of club or organization: Not on file    Attends meetings of clubs or organizations: Not on file    Relationship status: Not on file  Other Topics Concern  . Not on file  Social  History Narrative  . Not on file     Family History: The patient's family history includes Cancer in his mother; Heart attack in his father; Heart disease in his father.  ROS:   Please see the history of present illness.    All other systems reviewed and are negative.  EKGs/Labs/Other Studies  Reviewed:    The following studies were reviewed today: I discussed my findings with the patient at extensive length.   Recent Labs: 12/01/2017: BUN 13; Creatinine, Ser 1.57; Hemoglobin 11.8; Platelets 262; Potassium 4.0; Sodium 140  Recent Lipid Panel No results found for: CHOL, TRIG, HDL, CHOLHDL, VLDL, LDLCALC, LDLDIRECT  Physical Exam:    VS:  BP (!) 180/84 (BP Location: Right Arm, Patient Position: Sitting, Cuff Size: Normal)   Pulse 77   Ht 5\' 8"  (1.727 m)   Wt 213 lb (96.6 kg)   SpO2 93%   BMI 32.39 kg/m     Wt Readings from Last 3 Encounters:  09/29/18 213 lb (96.6 kg)  08/04/18 204 lb (92.5 kg)  05/05/18 204 lb (92.5 kg)     GEN: Patient is in no acute distress HEENT: Normal NECK: No JVD; No carotid bruits LYMPHATICS: No lymphadenopathy CARDIAC: Hear sounds regular, 2/6 systolic murmur at the apex. RESPIRATORY:  Clear to auscultation without rales, wheezing or rhonchi  ABDOMEN: Soft, non-tender, non-distended MUSCULOSKELETAL:  No edema; No deformity  SKIN: Warm and dry NEUROLOGIC:  Alert and oriented x 3 PSYCHIATRIC:  Normal affect   Signed, Jenean Lindau, MD  09/29/2018 8:41 AM    [place]

## 2018-09-29 NOTE — Patient Instructions (Signed)
Medication Instructions:  Your physician has recommended you make the following change in your medication:  START: Aspirin 81 mg two times daily  Nitroglycerin 0.4 mg sublingual tablet every 5 min PRN   If you need a refill on your cardiac medications before your next appointment, please call your pharmacy.   Lab work: None If you have labs (blood work) drawn today and your tests are completely normal, you will receive your results only by: Marland Kitchen MyChart Message (if you have MyChart) OR . A paper copy in the mail If you have any lab test that is abnormal or we need to change your treatment, we will call you to review the results.  Testing/Procedures: None  Follow-Up: At [facility], you and your health needs are our priority.  As part of our continuing mission to provide you with exceptional heart care, we have created designated Provider Care Teams.  These Care Teams include your primary Cardiologist (physician) and Advanced Practice Providers (APPs -  Physician Assistants and Nurse Practitioners) who all work together to provide you with the care you need, when you need it. You will need a follow up appointment in 6 months.  Please call our office 2 months in advance to schedule this appointment.  You may see No primary care provider on file. or another member of our [employer] Provider Team in [place]: Jenne Campus, MD . Shirlee More, MD  Any Other Special Instructions Will Be Listed Below (If Applicable).

## 2018-10-05 DIAGNOSIS — N183 Chronic kidney disease, stage 3 (moderate): Secondary | ICD-10-CM | POA: Diagnosis not present

## 2018-10-07 DIAGNOSIS — I129 Hypertensive chronic kidney disease with stage 1 through stage 4 chronic kidney disease, or unspecified chronic kidney disease: Secondary | ICD-10-CM | POA: Diagnosis not present

## 2018-10-07 DIAGNOSIS — N183 Chronic kidney disease, stage 3 (moderate): Secondary | ICD-10-CM | POA: Diagnosis not present

## 2018-10-07 DIAGNOSIS — E669 Obesity, unspecified: Secondary | ICD-10-CM | POA: Diagnosis not present

## 2018-10-07 DIAGNOSIS — E1122 Type 2 diabetes mellitus with diabetic chronic kidney disease: Secondary | ICD-10-CM | POA: Diagnosis not present

## 2018-11-06 ENCOUNTER — Ambulatory Visit (INDEPENDENT_AMBULATORY_CARE_PROVIDER_SITE_OTHER): Payer: Medicare Other | Admitting: Sports Medicine

## 2018-11-06 ENCOUNTER — Encounter: Payer: Self-pay | Admitting: Sports Medicine

## 2018-11-06 VITALS — BP 154/72 | HR 75 | Resp 16

## 2018-11-06 DIAGNOSIS — Z89432 Acquired absence of left foot: Secondary | ICD-10-CM | POA: Diagnosis not present

## 2018-11-06 DIAGNOSIS — I739 Peripheral vascular disease, unspecified: Secondary | ICD-10-CM | POA: Diagnosis not present

## 2018-11-06 DIAGNOSIS — B351 Tinea unguium: Secondary | ICD-10-CM | POA: Diagnosis not present

## 2018-11-06 DIAGNOSIS — E1142 Type 2 diabetes mellitus with diabetic polyneuropathy: Secondary | ICD-10-CM | POA: Diagnosis not present

## 2018-11-06 NOTE — Progress Notes (Signed)
Patient ID: [name], male   DOB: 1940-11-25, 78 y.o.   MRN: [id number]  Subjective: [name] is a 78 y.o. male patient with history of type 2 diabetes who presents to office today complaining of callus and long, painful nails  while ambulating in shoes; unable to trim. Patient states that the glucose reading this morning was "good", 130. Last visit to PCP was 6 weeks ago. Patient denies any other new changes in medication or new problems.  Patient denies constitutional symptoms at this time.  Patient Active Problem List   Diagnosis Date Noted  . Diabetes mellitus due to underlying condition with unspecified complications ([place]) 45/80/9983  . Gangrene of left foot ([place]) 11/28/2017  . Obesity (BMI 30-39.9) 10/06/2017  . Hx of CABG 06/25/2017  . ATN (acute tubular necrosis) ([place]) 03/28/2016  . Hematuria 03/28/2016  . Hyperkalemia 03/28/2016  . Benign hypertension with CKD (chronic kidney disease) stage III ([place]) 03/13/2016  . CAD (coronary artery disease) 12/14/2015  . Dyslipidemia 09/25/2015  . Impaired renal function 09/25/2015  . Peripheral vascular disease, unspecified ([place]) 09/02/2012   Current Outpatient Medications on File Prior to Visit  Medication Sig Dispense Refill  . aspirin EC 81 MG tablet Take 2 tablets (162 mg total) by mouth daily. 90 tablet 1  . carvedilol (COREG) 12.5 MG tablet Take 1 tablet (12.5 mg total) by mouth 2 (two) times daily with a meal. 180 tablet 3  . ezetimibe (ZETIA) 10 MG tablet Take 1 tablet (10 mg total) by mouth daily. 90 tablet 3  . fenofibrate 160 MG tablet Take 160 mg by mouth daily.    Marland Kitchen linagliptin (TRADJENTA) 5 MG TABS tablet Take 5 mg by mouth.    Marland Kitchen lisinopril (PRINIVIL,ZESTRIL) 40 MG tablet Take 20 mg by mouth daily.     . nitroGLYCERIN (NITROSTAT) 0.4 MG SL tablet Place 1 tablet (0.4 mg total) under the tongue every 5 (five) minutes as needed for chest pain. 90 tablet 3  . pioglitazone (ACTOS) 45 MG tablet Take 45 mg by mouth every morning.     . rosuvastatin (CRESTOR) 40 MG tablet Take 20 mg by mouth daily.     . Vitamin D, Ergocalciferol, (DRISDOL) 50000 units CAPS capsule Take 50,000 Units by mouth every 7 (seven) days. Takes on Monday    . vitamin E 400 UNIT capsule Take 400 Units by mouth daily.     No current facility-administered medications on file prior to visit.    No Known Allergies   Objective: General: Patient is awake, alert, and oriented x 3 and in no acute distress.  Integument: Skin is warm, dry and supple bilateral. Nails are tender, long, thickened and dystrophic with subungual debris, consistent with onychomycosis, 1-5 on right. No signs of infection.  Left TMA healed.  Remaining integument unremarkable.  Vasculature:  Dorsalis Pedis pulse 2/4 bilateral. Posterior Tibial pulse  1/4 bilateral. Capillary fill time 4 sec on right. Scant hair growth to the level of the digits on right.Temperature gradient within normal limits. + varicosities present bilateral. No edema present bilateral.   Neurology: The patient has diminished sensation measured with a 5.07/10g Semmes Weinstein Monofilament at all pedal sites bilateral. Vibratory sensation absent bilateral.   Musculoskeletal: Bunion and hammertoe on right, Status post amputation of Left partial foot, Muscular strength 5/5 in all lower extremity muscular groups bilateral without pain on range of motion . No tenderness with calf compression bilateral.  Assessment and Plan: Problem List Items Addressed This Visit  Cardiovascular and Mediastinum   Peripheral vascular disease, unspecified ([place])    Other Visit Diagnoses    Dermatophytosis of nail    -  Primary   Diabetic polyneuropathy associated with type 2 diabetes mellitus ([place])       Status post transmetatarsal amputation of foot, left ([place])          -Examined patient. -Discussed and educated patient on diabetic foot care, especially with regards to the vascular, neurological and musculoskeletal  systems -Mechanically debrided all nails x5 on right using sterile nail nipper and filed with dremel without incident  -Patient to return in 2.5 months for at risk foot care -Patient advised to call the office if any problems or questions arise in the meantime.  [name], DPM

## 2018-11-10 ENCOUNTER — Telehealth: Payer: Self-pay | Admitting: Cardiology

## 2018-11-10 NOTE — Telephone Encounter (Signed)
Legs and feet are swelling

## 2018-11-10 NOTE — Telephone Encounter (Signed)
Patients wife reports his legs and feet have been swelling for 3-4 weeks. He denies any shortness of breath. He has been very tired. No diuretics at this time. She reports he takes medications as prescribed. Patients wife is requesting he bee see by Dr. Geraldo Pitter. Will route to Dr. Geraldo Pitter. I will call wife back at [phone]

## 2018-11-10 NOTE — Telephone Encounter (Signed)
Please advise 

## 2018-11-11 NOTE — Telephone Encounter (Signed)
My preference is that he sees his vascular doctor and or primary care doctor first.  If he is any difficulty doing that I will be glad to see him today.  But he showed get in touch with them first.

## 2018-11-11 NOTE — Telephone Encounter (Signed)
Left message for patient/patient's wife to return call

## 2018-11-12 NOTE — Telephone Encounter (Signed)
Patient's wife returned phone call and was advised of Dr. Julien Nordmann advice on seeing vascular or his Primary care first. Patient's wife agreed to to proceed this way.

## 2018-11-24 ENCOUNTER — Ambulatory Visit (INDEPENDENT_AMBULATORY_CARE_PROVIDER_SITE_OTHER): Payer: Medicare Other | Admitting: *Deleted

## 2018-11-24 DIAGNOSIS — M2011 Hallux valgus (acquired), right foot: Secondary | ICD-10-CM | POA: Diagnosis not present

## 2018-11-24 DIAGNOSIS — M2041 Other hammer toe(s) (acquired), right foot: Secondary | ICD-10-CM

## 2018-11-24 DIAGNOSIS — Z89432 Acquired absence of left foot: Secondary | ICD-10-CM

## 2018-11-24 DIAGNOSIS — E1140 Type 2 diabetes mellitus with diabetic neuropathy, unspecified: Secondary | ICD-10-CM

## 2018-11-24 NOTE — Patient Instructions (Signed)

## 2018-12-01 DIAGNOSIS — E1122 Type 2 diabetes mellitus with diabetic chronic kidney disease: Secondary | ICD-10-CM | POA: Diagnosis not present

## 2018-12-01 DIAGNOSIS — E785 Hyperlipidemia, unspecified: Secondary | ICD-10-CM | POA: Diagnosis not present

## 2018-12-01 DIAGNOSIS — R600 Localized edema: Secondary | ICD-10-CM | POA: Diagnosis not present

## 2018-12-01 DIAGNOSIS — I1310 Hypertensive heart and chronic kidney disease without heart failure, with stage 1 through stage 4 chronic kidney disease, or unspecified chronic kidney disease: Secondary | ICD-10-CM | POA: Diagnosis not present

## 2018-12-03 NOTE — Progress Notes (Signed)
Patient ID: [name], male   DOB: 1941/10/12, 78 y.o.   MRN: [id number]   Patient presents for diabetic shoe pick up, shoes are tried on for good fit.  Patient received 1 pair Apex Mens - Athletic Walker White/Blue Lace [phone] in 9 extra wide and 3 right custom molded diabetic inserts. Custom molded left insert with forefoot filler was not shipped as requested patient was remolded and mold was shipped to lab patient will be notified when insert arrives. Verbal and written break in and wear instructions given.  Patient will follow up for scheduled routine care.

## 2018-12-11 ENCOUNTER — Other Ambulatory Visit: Payer: Self-pay

## 2018-12-11 DIAGNOSIS — I739 Peripheral vascular disease, unspecified: Secondary | ICD-10-CM

## 2018-12-11 DIAGNOSIS — Z95828 Presence of other vascular implants and grafts: Secondary | ICD-10-CM

## 2018-12-11 DIAGNOSIS — I779 Disorder of arteries and arterioles, unspecified: Secondary | ICD-10-CM

## 2018-12-16 NOTE — Progress Notes (Signed)
HISTORY AND PHYSICAL     CC:  follow up. Requesting Provider:  Nicoletta Dress, MD  HPI: This is a 78 y.o. male who is here today for follow up.  He has a hx of left TMA January 2019 by Dr. Oneida Alar.  He underwent left mid SFA to DP bypass in 2006, left SFA endarterectomy in 2009 and had a left CEA in 2000 in [place].    He was last seen in October 2019 and at that time, his TMA was healed with easily palpable DP pulse on the left. His LLE duplex revealed Significant soft plaque mid thigh extending into the anastomosis. Patent left SFA to DPA bypass graft with no evidence for restenosis.  All biphasic waveforms and no significant change compared to the exam on 05-05-18.   His ABI's were normal bilaterally.   His carotid duplex revealed 1-39% right ICA stenosis and patent left CEA site.   The pt returns today for follow up.  He states that he is doing well.  He does not have any claudication sx.  He state he does have some leg swelling.  This is equal in both legs and improved in the mornings when he wakes up.  He has some mild shortness of breath with activity.  He states he has gained some weight during these winter months.  The pt is on a statin for cholesterol management.    The pt does have diabetes. The pt is on a BB and ACEI for hypertension.  The pt is on an aspirin.  Tobacco hx:  Remote-quit ~1994 Other AC:  none  Past Medical History:  Diagnosis Date  . Arthritis   . CAD (coronary artery disease)   . Carotid artery occlusion   . Diabetes mellitus Age 88  . Hyperlipidemia   . Hypertension   . Joint pain   . Peripheral vascular disease ([place])   . Ulcer     Past Surgical History:  Procedure Laterality Date  . ABDOMINAL AORTOGRAM W/LOWER EXTREMITY N/A 11/28/2017   Procedure: ABDOMINAL AORTOGRAM W/LOWER EXTREMITY;  Surgeon: Elam Dutch, MD;  Location: [facility];  Service: Cardiovascular;  Laterality: N/A;  . AMPUTATION     left first toe  . APPENDECTOMY      . CAROTID ENDARTERECTOMY Left 2000   CE  . FOOT SURGERY    . PR VEIN BYPASS GRAFT,AORTO-FEM-POP  07/2005   left femoral to dorsalis pedis artery bypass  . TRANSMETATARSAL AMPUTATION Left 12/01/2017   Procedure: TRANSMETATARSAL AMPUTATION LEFT;  Surgeon: Elam Dutch, MD;  Location: [facility];  Service: Vascular;  Laterality: Left;    No Known Allergies  Current Outpatient Medications  Medication Sig Dispense Refill  . aspirin EC 81 MG tablet Take 2 tablets (162 mg total) by mouth daily. 90 tablet 1  . carvedilol (COREG) 12.5 MG tablet Take 1 tablet (12.5 mg total) by mouth 2 (two) times daily with a meal. 180 tablet 3  . ezetimibe (ZETIA) 10 MG tablet Take 1 tablet (10 mg total) by mouth daily. 90 tablet 3  . fenofibrate 160 MG tablet Take 160 mg by mouth daily.    Marland Kitchen linagliptin (TRADJENTA) 5 MG TABS tablet Take 5 mg by mouth.    Marland Kitchen lisinopril (PRINIVIL,ZESTRIL) 40 MG tablet Take 20 mg by mouth daily.     . nitroGLYCERIN (NITROSTAT) 0.4 MG SL tablet Place 1 tablet (0.4 mg total) under the tongue every 5 (five) minutes as needed for chest pain. 90 tablet  3  . pioglitazone (ACTOS) 45 MG tablet Take 45 mg by mouth every morning.    . rosuvastatin (CRESTOR) 40 MG tablet Take 20 mg by mouth daily.     . Vitamin D, Ergocalciferol, (DRISDOL) 50000 units CAPS capsule Take 50,000 Units by mouth every 7 (seven) days. Takes on Monday    . vitamin E 400 UNIT capsule Take 400 Units by mouth daily.     No current facility-administered medications for this visit.     Family History  Problem Relation Age of Onset  . Heart disease Father        Heart Disease before age 75  . Heart attack Father   . Cancer Mother        Bladder    Social History   Socioeconomic History  . Marital status: Married    Spouse name: Not on file  . Number of children: Not on file  . Years of education: Not on file  . Highest education level: Not on file  Occupational History  . Not on file  Social Needs  .  Financial resource strain: Not on file  . Food insecurity:    Worry: Not on file    Inability: Not on file  . Transportation needs:    Medical: Not on file    Non-medical: Not on file  Tobacco Use  . Smoking status: Former Smoker    Last attempt to quit: 11/04/1978    Years since quitting: 40.1  . Smokeless tobacco: Never Used  Substance and Sexual Activity  . Alcohol use: No  . Drug use: No  . Sexual activity: Not on file  Lifestyle  . Physical activity:    Days per week: Not on file    Minutes per session: Not on file  . Stress: Not on file  Relationships  . Social connections:    Talks on phone: Not on file    Gets together: Not on file    Attends religious service: Not on file    Active member of club or organization: Not on file    Attends meetings of clubs or organizations: Not on file    Relationship status: Not on file  . Intimate partner violence:    Fear of current or ex partner: Not on file    Emotionally abused: Not on file    Physically abused: Not on file    Forced sexual activity: Not on file  Other Topics Concern  . Not on file  Social History Narrative  . Not on file     REVIEW OF SYSTEMS:   [X]  denotes positive finding, [ ]  denotes negative finding Cardiac  Comments:  Chest pain or chest pressure:    Shortness of breath upon exertion: x   Short of breath when lying flat:    Irregular heart rhythm:        Vascular    Pain in calf, thigh, or hip brought on by ambulation:    Pain in feet at night that wakes you up from your sleep:     Blood clot in your veins:    Leg swelling:  x       Pulmonary    Oxygen at home:    Productive cough:     Wheezing:         Neurologic    Sudden weakness in arms or legs:     Sudden numbness in arms or legs:     Sudden onset of difficulty speaking or slurred  speech:    Temporary loss of vision in one eye:     Problems with dizziness:         Gastrointestinal    Blood in stool:     Vomited blood:           Genitourinary    Burning when urinating:     Blood in urine:        Psychiatric    Major depression:         Hematologic    Bleeding problems:    Problems with blood clotting too easily:        Skin    Rashes or ulcers:        Constitutional    Fever or chills:      PHYSICAL EXAMINATION:  Today's Vitals   12/17/18 1454  BP: (!) 158/73  Pulse: 74  Resp: 20  Temp: (!) 97.1 F (36.2 C)  SpO2: 95%  Weight: 229 lb 4.5 oz (104 kg)  Height: 5\' 8"  (1.727 m)   Body mass index is 34.86 kg/m.   General:  WDWN in NAD; vital signs documented above Gait: Not observed HENT: WNL, normocephalic Pulmonary: normal non-labored breathing , without Rales, rhonchi,  wheezing Cardiac: regular HR, without  Murmurs; without carotid bruits Abdomen: soft, NT, no masses Skin: without rashes Vascular Exam/Pulses:  Right Left  Radial 2+ (normal) Unable to palpate  Ulnar Unable to palpate  Unable to palpate  Femoral 2+ (normal) 2+ (normal)  Popliteal Unable to palpate  Unable to palpate   DP 1+ (weak) 2+ (normal)  PT Unable to palpate  Unable to palpate    Extremities: without ischemic changes, without Gangrene , without cellulitis; without open wounds; left TMA is well healed. 1+ pitting edema BLE Musculoskeletal: no muscle wasting or atrophy  Neurologic: A&O X 3;  No focal weakness or paresthesias are detected Psychiatric:  The pt has Normal affect.   Non-Invasive Vascular Imaging:   ABI's/TBI's on 12/17/2018: Right:  0.73/unable to obtain TBI Left:  1.09/absent due to TMA  Previous ABI's/TBI's on 08/04/18: Right:  0.97/0.56  PT/DP (B) Left:  1.10  PT/DP (B) Right ABI remains normal with biphasic waveforms Left ABI improved form moderate disease to normal with biphasic waveforms  Carotid duplex July 2019: 1-39% right Patent left CEA site Vertebrals:  Bilateral vertebral arteries demonstrate antegrade flow. Subclavians: Normal flow hemodynamics were seen in bilateral  subclavian arteries.  ASSESSMENT/PLAN:: 78 y.o. male here for follow up for  left TMA January 2019 by Dr. Oneida Alar.  He underwent left mid SFA to DP bypass in 2006, left SFA endarterectomy in 2009 and had a left CEA in 2000 in [place].     -pt doing well and bypass is patent with excellent DP pulse.  TMA continues to be well healed.  Right ABI decreased today-pt does not have any claudication sx or non healing wounds.  -he does have bilateral 1+ pitting edema.  Discussed with Dr. Oneida Alar and pt can wear 15-86mmHg knee high compression stockings.  Also discussed leg elevation with pt.   -pt's last carotid duplex in July 2019 had 1-39% right ICA stenosis and patent CEA site on the left.  Will have him return in one year from now with carotid duplex, ABI and LLE arterial duplex.   -discussed s/s of stroke and he knows to go to the ER should he have any of these.  He will contact us sooner if he has any issues.  He has appt with  cardiologist at the end of the month.  Leontine Locket, PA-C [facility] [phone]  Clinic MD:   Oneida Alar

## 2018-12-17 ENCOUNTER — Ambulatory Visit (INDEPENDENT_AMBULATORY_CARE_PROVIDER_SITE_OTHER): Payer: Medicare Other | Admitting: Physician Assistant

## 2018-12-17 ENCOUNTER — Ambulatory Visit (HOSPITAL_COMMUNITY)
Admission: RE | Admit: 2018-12-17 | Discharge: 2018-12-17 | Disposition: A | Discharge status: Home / Self Care | Payer: Medicare Other | Source: Ambulatory Visit | Attending: Vascular Surgery | Admitting: Vascular Surgery

## 2018-12-17 ENCOUNTER — Ambulatory Visit (INDEPENDENT_AMBULATORY_CARE_PROVIDER_SITE_OTHER)
Admission: RE | Admit: 2018-12-17 | Discharge: 2018-12-17 | Disposition: A | Discharge status: Home / Self Care | Payer: Medicare Other | Source: Ambulatory Visit | Attending: Vascular Surgery | Admitting: Vascular Surgery

## 2018-12-17 ENCOUNTER — Encounter: Payer: Self-pay | Admitting: Physician Assistant

## 2018-12-17 ENCOUNTER — Other Ambulatory Visit: Payer: Self-pay

## 2018-12-17 VITALS — BP 158/73 | HR 74 | Temp 97.1°F | Resp 20 | Ht 68.0 in | Wt 229.3 lb

## 2018-12-17 DIAGNOSIS — I739 Peripheral vascular disease, unspecified: Secondary | ICD-10-CM

## 2018-12-17 DIAGNOSIS — Z95828 Presence of other vascular implants and grafts: Secondary | ICD-10-CM | POA: Diagnosis not present

## 2018-12-17 DIAGNOSIS — I779 Disorder of arteries and arterioles, unspecified: Secondary | ICD-10-CM

## 2018-12-22 ENCOUNTER — Encounter: Payer: Self-pay | Admitting: Cardiology

## 2018-12-22 ENCOUNTER — Ambulatory Visit (INDEPENDENT_AMBULATORY_CARE_PROVIDER_SITE_OTHER): Payer: Medicare Other | Admitting: Cardiology

## 2018-12-22 VITALS — BP 136/72 | HR 71 | Ht 68.0 in | Wt 220.2 lb

## 2018-12-22 DIAGNOSIS — I2510 Atherosclerotic heart disease of native coronary artery without angina pectoris: Secondary | ICD-10-CM | POA: Diagnosis not present

## 2018-12-22 DIAGNOSIS — I739 Peripheral vascular disease, unspecified: Secondary | ICD-10-CM | POA: Diagnosis not present

## 2018-12-22 DIAGNOSIS — Z951 Presence of aortocoronary bypass graft: Secondary | ICD-10-CM | POA: Diagnosis not present

## 2018-12-22 DIAGNOSIS — I129 Hypertensive chronic kidney disease with stage 1 through stage 4 chronic kidney disease, or unspecified chronic kidney disease: Secondary | ICD-10-CM | POA: Diagnosis not present

## 2018-12-22 DIAGNOSIS — N183 Chronic kidney disease, stage 3 unspecified: Secondary | ICD-10-CM

## 2018-12-22 DIAGNOSIS — E088 Diabetes mellitus due to underlying condition with unspecified complications: Secondary | ICD-10-CM

## 2018-12-22 DIAGNOSIS — E785 Hyperlipidemia, unspecified: Secondary | ICD-10-CM | POA: Diagnosis not present

## 2018-12-22 DIAGNOSIS — R600 Localized edema: Secondary | ICD-10-CM | POA: Diagnosis not present

## 2018-12-22 MED ORDER — NITROGLYCERIN 0.4 MG SL SUBL: 0.4000 mg | SUBLINGUAL_TABLET | SUBLINGUAL | 11 refills | Status: AC | PRN

## 2018-12-22 NOTE — Progress Notes (Signed)
Cardiology Office Note:    Date:  12/22/2018   ID:  [name], DOB February 16, 1941, MRN [id number]  PCP:  Nicoletta Dress, MD  Cardiologist:  Jenean Lindau, MD   Referring MD: Nicoletta Dress, MD    ASSESSMENT:    1. Benign hypertension with CKD (chronic kidney disease) stage III ([place])   2. Coronary artery disease involving native coronary artery of native heart without angina pectoris   3. Peripheral vascular disease, unspecified ([place])   4. Diabetes mellitus due to underlying condition with unspecified complications ([place])   5. Dyslipidemia   6. Hx of CABG    PLAN:    In order of problems listed above:  1. Secondary prevention stressed with the patient.  Importance of compliance with diet and medication stressed and he vocalized understanding.  His blood pressure is stable.  Diet was discussed for dyslipidemia and diabetes mellitus. 2. Sublingual nitroglycerin prescription was sent, its protocol and 911 protocol explained and the patient vocalized understanding questions were answered to the patient's satisfaction 3. In view of the patient's bilateral pedal edema and the fact that he has not undergone evaluation for quite some time he will undergo Lexiscan sestamibi.  He leads a sedentary lifestyle overall.  Echocardiogram will be done to assess murmur heard on auscultation and left ventricular systolic function.  His pedal edema might be factor of his renal insufficiency. 4. We will do a BNP today to understand if he has any heart failure issues. 5. Patient will be seen in follow-up appointment in 2 months or earlier if the patient has any concerns    Medication Adjustments/Labs and Tests Ordered: Current medicines are reviewed at length with the patient today.  Concerns regarding medicines are outlined above.  No orders of the defined types were placed in this encounter.  No orders of the defined types were placed in this encounter.    Chief Complaint  Patient presents  with  . Follow-up    No Concerns at this time      History of Present Illness:    [name] is a 78 y.o. male.  Patient has known coronary artery disease, essential hypertension, dyslipidemia and diabetes mellitus.  He has coronary artery disease post CABG surgery in the remote past and also has peripheral vascular disease.  He denies any problems at this time and takes care of activities of daily living.  No chest pain orthopnea or PND.  His only issue is bilateral pedal edema.  He also has renal insufficiency.  At the time of my evaluation, the patient is alert awake oriented and in no distress.  Past Medical History:  Diagnosis Date  . Arthritis   . CAD (coronary artery disease)   . Carotid artery occlusion   . Diabetes mellitus Age 79  . Hyperlipidemia   . Hypertension   . Joint pain   . Peripheral vascular disease ([place])   . Ulcer     Past Surgical History:  Procedure Laterality Date  . ABDOMINAL AORTOGRAM W/LOWER EXTREMITY N/A 11/28/2017   Procedure: ABDOMINAL AORTOGRAM W/LOWER EXTREMITY;  Surgeon: Elam Dutch, MD;  Location: [facility];  Service: Cardiovascular;  Laterality: N/A;  . AMPUTATION     left first toe  . APPENDECTOMY    . CAROTID ENDARTERECTOMY Left 2000   CE  . FOOT SURGERY    . PR VEIN BYPASS GRAFT,AORTO-FEM-POP  07/2005   left femoral to dorsalis pedis artery bypass  . TRANSMETATARSAL AMPUTATION  Left 12/01/2017   Procedure: TRANSMETATARSAL AMPUTATION LEFT;  Surgeon: Elam Dutch, MD;  Location: [facility];  Service: Vascular;  Laterality: Left;    Current Medications: Current Meds  Medication Sig  . aspirin EC 81 MG tablet Take 2 tablets (162 mg total) by mouth daily.  . carvedilol (COREG) 12.5 MG tablet Take 1 tablet (12.5 mg total) by mouth 2 (two) times daily with a meal.  . ezetimibe (ZETIA) 10 MG tablet Take 1 tablet (10 mg total) by mouth daily.  . fenofibrate 160 MG tablet Take 160 mg by mouth daily.  Marland Kitchen linagliptin (TRADJENTA) 5 MG  TABS tablet Take 5 mg by mouth.  Marland Kitchen lisinopril (PRINIVIL,ZESTRIL) 40 MG tablet Take 20 mg by mouth daily.   . nitroGLYCERIN (NITROSTAT) 0.4 MG SL tablet Place 1 tablet (0.4 mg total) under the tongue every 5 (five) minutes as needed for chest pain.  . pioglitazone (ACTOS) 45 MG tablet Take 45 mg by mouth every morning.  . rosuvastatin (CRESTOR) 40 MG tablet Take 20 mg by mouth daily.   . traZODone (DESYREL) 50 MG tablet Take 50-100 mg by mouth at bedtime.  . Vitamin D, Ergocalciferol, (DRISDOL) 50000 units CAPS capsule Take 50,000 Units by mouth every 7 (seven) days. Takes on Monday  . vitamin E 400 UNIT capsule Take 400 Units by mouth daily.     Allergies:   Patient has no known allergies.   Social History   Socioeconomic History  . Marital status: Married    Spouse name: Not on file  . Number of children: Not on file  . Years of education: Not on file  . Highest education level: Not on file  Occupational History  . Not on file  Social Needs  . Financial resource strain: Not on file  . Food insecurity:    Worry: Not on file    Inability: Not on file  . Transportation needs:    Medical: Not on file    Non-medical: Not on file  Tobacco Use  . Smoking status: Former Smoker    Last attempt to quit: 11/04/1978    Years since quitting: 40.1  . Smokeless tobacco: Never Used  Substance and Sexual Activity  . Alcohol use: No  . Drug use: No  . Sexual activity: Not on file  Lifestyle  . Physical activity:    Days per week: Not on file    Minutes per session: Not on file  . Stress: Not on file  Relationships  . Social connections:    Talks on phone: Not on file    Gets together: Not on file    Attends religious service: Not on file    Active member of club or organization: Not on file    Attends meetings of clubs or organizations: Not on file    Relationship status: Not on file  Other Topics Concern  . Not on file  Social History Narrative  . Not on file     Family  History: The patient's family history includes Cancer in his mother; Heart attack in his father; Heart disease in his father.  ROS:   Please see the history of present illness.    All other systems reviewed and are negative.  EKGs/Labs/Other Studies Reviewed:    The following studies were reviewed today: I discussed my findings with the patient at extensive length.   Recent Labs: No results found for requested labs within last 8760 hours.  Recent Lipid Panel No results found for: CHOL,  TRIG, HDL, CHOLHDL, VLDL, LDLCALC, LDLDIRECT  Physical Exam:    VS:  BP 136/72   Pulse 71   Ht 5\' 8"  (1.727 m)   Wt 220 lb 3.2 oz (99.9 kg)   SpO2 93%   BMI 33.48 kg/m     Wt Readings from Last 3 Encounters:  12/22/18 220 lb 3.2 oz (99.9 kg)  12/17/18 229 lb 4.5 oz (104 kg)  09/29/18 213 lb (96.6 kg)     GEN: Patient is in no acute distress HEENT: Normal NECK: No JVD; No carotid bruits LYMPHATICS: No lymphadenopathy CARDIAC: Hear sounds regular, 2/6 systolic murmur at the apex. RESPIRATORY:  Clear to auscultation without rales, wheezing or rhonchi  ABDOMEN: Soft, non-tender, non-distended MUSCULOSKELETAL:  No edema; No deformity  SKIN: Warm and dry NEUROLOGIC:  Alert and oriented x 3 PSYCHIATRIC:  Normal affect   Signed, Jenean Lindau, MD  12/22/2018 8:40 AM    [place]

## 2018-12-22 NOTE — Patient Instructions (Signed)
Medication Instructions:   Your physician recommends that you continue on your current medications as directed. Please refer to the Current Medication list given to you today.   If you need a refill on your cardiac medications before your next appointment, please call your pharmacy.   Lab work:  Your physician recommends that you return for lab work today: BNP   If you have labs (blood work) drawn today and your tests are completely normal, you will receive your results only by: Marland Kitchen MyChart Message (if you have MyChart) OR . A paper copy in the mail If you have any lab test that is abnormal or we need to change your treatment, we will call you to review the results.  Testing/Procedures:  Your physician has requested that you have a lexiscan myoview. For further information please visit [email or web address]. Please follow instruction sheet, as given.  Your physician has requested that you have an echocardiogram. Echocardiography is a painless test that uses sound waves to create images of your heart. It provides your doctor with information about the size and shape of your heart and how well your heart's chambers and valves are working. This procedure takes approximately one hour. There are no restrictions for this procedure.    Follow-Up: At [facility], you and your health needs are our priority.  As part of our continuing mission to provide you with exceptional heart care, we have created designated Provider Care Teams.  These Care Teams include your primary Cardiologist (physician) and Advanced Practice Providers (APPs -  Physician Assistants and Nurse Practitioners) who all work together to provide you with the care you need, when you need it.  You will need a follow up appointment in 2 months.

## 2018-12-23 ENCOUNTER — Telehealth: Payer: Self-pay

## 2018-12-23 LAB — PRO B NATRIURETIC PEPTIDE: NT-Pro BNP: 2360 pg/mL — ABNORMAL HIGH (ref 0–486)

## 2018-12-23 NOTE — Telephone Encounter (Signed)
Attempted to call patients home and cell with no response. Let message on home line to return call for labs and medication information.

## 2018-12-23 NOTE — Telephone Encounter (Signed)
-----   Message from Jenean Lindau, MD sent at 12/23/2018  3:42 PM EST ----- Reduce lisinopril to half dose. Lasix 40mg  daily .Marland Kitchen bmp in one week. Jenean Lindau, MD 12/23/2018 3:41 PM

## 2018-12-24 ENCOUNTER — Other Ambulatory Visit: Payer: Medicare Other

## 2018-12-24 MED ORDER — FUROSEMIDE 40 MG PO TABS: 40.0000 mg | ORAL_TABLET | Freq: Every day | ORAL | 3 refills | Status: DC

## 2018-12-24 MED ORDER — LISINOPRIL 20 MG PO TABS: 20.0000 mg | ORAL_TABLET | Freq: Every day | ORAL | 3 refills | Status: DC

## 2018-12-24 NOTE — Telephone Encounter (Signed)
Spoke with patients wife and results relayed. Instructed to decrease lisinopril dose to 20 mg daily and patient agrees to start furosemide 40 mg daily. Follow up labs to be completed in 1 week. No further questions at this time.

## 2018-12-24 NOTE — Telephone Encounter (Signed)
-----   Message from Jenean Lindau, MD sent at 12/23/2018  3:42 PM EST ----- Reduce lisinopril to half dose. Lasix 40mg  daily .Marland Kitchen bmp in one week. Jenean Lindau, MD 12/23/2018 3:41 PM

## 2018-12-25 MED ORDER — LISINOPRIL 20 MG PO TABS: 10.0000 mg | ORAL_TABLET | Freq: Every day | ORAL | 3 refills | Status: DC

## 2018-12-25 NOTE — Addendum Note (Signed)
Addended by: Stevan Born on: 12/25/2018 12:23 PM   Modules accepted: Orders

## 2018-12-25 NOTE — Telephone Encounter (Signed)
Patient's wife [name] called to the office to notify our office that patient was already taking Lisinopril 20mg  daily at the last appointment and at the time of the lab work.  [name] was advised to have patient only take Lisinopril 10mg  as directed per Dr Julien Nordmann advice after recent lab work.

## 2019-01-06 ENCOUNTER — Telehealth: Payer: Self-pay | Admitting: *Deleted

## 2019-01-06 ENCOUNTER — Other Ambulatory Visit: Payer: Self-pay

## 2019-01-06 DIAGNOSIS — N183 Chronic kidney disease, stage 3 unspecified: Secondary | ICD-10-CM

## 2019-01-06 DIAGNOSIS — I129 Hypertensive chronic kidney disease with stage 1 through stage 4 chronic kidney disease, or unspecified chronic kidney disease: Secondary | ICD-10-CM | POA: Diagnosis not present

## 2019-01-06 NOTE — Telephone Encounter (Signed)
Left message on voicemail per DPR in reference to upcoming appointment scheduled on 01/13/19 at 1115 with detailed instructions given per Myocardial Perfusion Study Information Sheet for the test. LM to arrive 15 minutes early, and that it is imperative to arrive on time for appointment to keep from having the test rescheduled. If you need to cancel or reschedule your appointment, please call the office within 24 hours of your appointment. Failure to do so may result in a cancellation of your appointment, and a $50 no show fee. Phone number given for call back for any questions. Roxann Vierra, Ranae Palms No mychart

## 2019-01-07 ENCOUNTER — Telehealth: Payer: Self-pay | Admitting: Emergency Medicine

## 2019-01-07 DIAGNOSIS — J9601 Acute respiratory failure with hypoxia: Secondary | ICD-10-CM | POA: Diagnosis not present

## 2019-01-07 DIAGNOSIS — J181 Lobar pneumonia, unspecified organism: Secondary | ICD-10-CM | POA: Diagnosis not present

## 2019-01-07 DIAGNOSIS — N183 Chronic kidney disease, stage 3 unspecified: Secondary | ICD-10-CM

## 2019-01-07 DIAGNOSIS — R0602 Shortness of breath: Secondary | ICD-10-CM | POA: Diagnosis not present

## 2019-01-07 DIAGNOSIS — I129 Hypertensive chronic kidney disease with stage 1 through stage 4 chronic kidney disease, or unspecified chronic kidney disease: Secondary | ICD-10-CM

## 2019-01-07 LAB — BASIC METABOLIC PANEL
BUN/Creatinine Ratio: 12 (ref 10–24)
BUN: 21 mg/dL (ref 8–27)
CALCIUM: 8.9 mg/dL (ref 8.6–10.2)
CO2: 23 mmol/L (ref 20–29)
CREATININE: 1.73 mg/dL — AB (ref 0.76–1.27)
Chloride: 107 mmol/L — ABNORMAL HIGH (ref 96–106)
GFR, EST AFRICAN AMERICAN: 44 mL/min/{1.73_m2} — AB (ref >59)
GFR, EST NON AFRICAN AMERICAN: 38 mL/min/{1.73_m2} — AB (ref >59)
Glucose: 116 mg/dL — ABNORMAL HIGH (ref 65–99)
Potassium: 4.3 mmol/L (ref 3.5–5.2)
Sodium: 145 mmol/L — ABNORMAL HIGH (ref 134–144)

## 2019-01-07 NOTE — Telephone Encounter (Signed)
Opened in error

## 2019-01-07 NOTE — Telephone Encounter (Signed)
Patient's wife was informed of results per dpr and advised to have him repeat lab work in 2 months. She verbally understands. Labs sent to primary care physician.

## 2019-01-08 DIAGNOSIS — E1169 Type 2 diabetes mellitus with other specified complication: Secondary | ICD-10-CM | POA: Diagnosis not present

## 2019-01-08 DIAGNOSIS — R791 Abnormal coagulation profile: Secondary | ICD-10-CM | POA: Diagnosis present

## 2019-01-08 DIAGNOSIS — J9601 Acute respiratory failure with hypoxia: Secondary | ICD-10-CM | POA: Diagnosis not present

## 2019-01-08 DIAGNOSIS — J189 Pneumonia, unspecified organism: Secondary | ICD-10-CM | POA: Diagnosis not present

## 2019-01-08 DIAGNOSIS — J181 Lobar pneumonia, unspecified organism: Secondary | ICD-10-CM | POA: Diagnosis not present

## 2019-01-08 DIAGNOSIS — R7881 Bacteremia: Secondary | ICD-10-CM | POA: Diagnosis present

## 2019-01-08 DIAGNOSIS — I4891 Unspecified atrial fibrillation: Secondary | ICD-10-CM | POA: Diagnosis present

## 2019-01-08 DIAGNOSIS — E785 Hyperlipidemia, unspecified: Secondary | ICD-10-CM | POA: Diagnosis present

## 2019-01-08 DIAGNOSIS — I272 Pulmonary hypertension, unspecified: Secondary | ICD-10-CM | POA: Diagnosis present

## 2019-01-08 DIAGNOSIS — M1990 Unspecified osteoarthritis, unspecified site: Secondary | ICD-10-CM | POA: Diagnosis present

## 2019-01-08 DIAGNOSIS — B961 Klebsiella pneumoniae [K. pneumoniae] as the cause of diseases classified elsewhere: Secondary | ICD-10-CM | POA: Diagnosis present

## 2019-01-08 DIAGNOSIS — Z79899 Other long term (current) drug therapy: Secondary | ICD-10-CM | POA: Diagnosis not present

## 2019-01-08 DIAGNOSIS — I739 Peripheral vascular disease, unspecified: Secondary | ICD-10-CM | POA: Diagnosis not present

## 2019-01-08 DIAGNOSIS — N183 Chronic kidney disease, stage 3 (moderate): Secondary | ICD-10-CM | POA: Diagnosis not present

## 2019-01-08 DIAGNOSIS — I5033 Acute on chronic diastolic (congestive) heart failure: Secondary | ICD-10-CM | POA: Diagnosis present

## 2019-01-08 DIAGNOSIS — Z7982 Long term (current) use of aspirin: Secondary | ICD-10-CM | POA: Diagnosis not present

## 2019-01-08 DIAGNOSIS — J9691 Respiratory failure, unspecified with hypoxia: Secondary | ICD-10-CM | POA: Diagnosis not present

## 2019-01-08 DIAGNOSIS — Z951 Presence of aortocoronary bypass graft: Secondary | ICD-10-CM | POA: Diagnosis not present

## 2019-01-08 DIAGNOSIS — I509 Heart failure, unspecified: Secondary | ICD-10-CM | POA: Diagnosis not present

## 2019-01-08 DIAGNOSIS — D638 Anemia in other chronic diseases classified elsewhere: Secondary | ICD-10-CM | POA: Diagnosis present

## 2019-01-08 DIAGNOSIS — E1122 Type 2 diabetes mellitus with diabetic chronic kidney disease: Secondary | ICD-10-CM | POA: Diagnosis present

## 2019-01-08 DIAGNOSIS — R0602 Shortness of breath: Secondary | ICD-10-CM | POA: Diagnosis not present

## 2019-01-08 DIAGNOSIS — R740 Nonspecific elevation of levels of transaminase and lactic acid dehydrogenase [LDH]: Secondary | ICD-10-CM | POA: Diagnosis present

## 2019-01-08 DIAGNOSIS — I252 Old myocardial infarction: Secondary | ICD-10-CM | POA: Diagnosis not present

## 2019-01-08 DIAGNOSIS — Z87891 Personal history of nicotine dependence: Secondary | ICD-10-CM | POA: Diagnosis not present

## 2019-01-08 DIAGNOSIS — I2510 Atherosclerotic heart disease of native coronary artery without angina pectoris: Secondary | ICD-10-CM | POA: Diagnosis present

## 2019-01-08 DIAGNOSIS — J150 Pneumonia due to Klebsiella pneumoniae: Secondary | ICD-10-CM | POA: Diagnosis present

## 2019-01-08 DIAGNOSIS — I130 Hypertensive heart and chronic kidney disease with heart failure and stage 1 through stage 4 chronic kidney disease, or unspecified chronic kidney disease: Secondary | ICD-10-CM | POA: Diagnosis present

## 2019-01-08 DIAGNOSIS — D649 Anemia, unspecified: Secondary | ICD-10-CM | POA: Diagnosis not present

## 2019-01-11 ENCOUNTER — Telehealth: Payer: Self-pay

## 2019-01-11 ENCOUNTER — Encounter: Payer: Self-pay | Admitting: Cardiology

## 2019-01-11 ENCOUNTER — Telehealth: Payer: Self-pay | Admitting: Cardiology

## 2019-01-11 NOTE — Telephone Encounter (Signed)
Thursday night-fever chills, nausea vomiting, SOB, BP 84/87, [id number]. Diagnosed with pneumonia. Just got out of [facility] [place]. Patient would like to cancel lexiscan scheduled on 01/13/19 and scheduled office visit with Dr. Docia Furl in next week.Information relayed to Guinevere Scarlet. who agreed to move appt around. Dr.RRR also advised.

## 2019-01-11 NOTE — Telephone Encounter (Signed)
Patient has been in the hospital for some Breathing issue sna dwe will pull off records from [facility], he is scheduled for lexiscan on Wed and they are concnerned it he needs to do ??

## 2019-01-11 NOTE — Telephone Encounter (Signed)
Information relayed to patients wife, no further questions at this time.Copy of results sent to Dr. Delena Bali per Dr. Docia Furl request.

## 2019-01-12 DIAGNOSIS — B961 Klebsiella pneumoniae [K. pneumoniae] as the cause of diseases classified elsewhere: Secondary | ICD-10-CM | POA: Diagnosis not present

## 2019-01-12 DIAGNOSIS — N184 Chronic kidney disease, stage 4 (severe): Secondary | ICD-10-CM | POA: Diagnosis not present

## 2019-01-12 DIAGNOSIS — E1122 Type 2 diabetes mellitus with diabetic chronic kidney disease: Secondary | ICD-10-CM | POA: Diagnosis not present

## 2019-01-12 DIAGNOSIS — Z7984 Long term (current) use of oral hypoglycemic drugs: Secondary | ICD-10-CM | POA: Diagnosis not present

## 2019-01-12 DIAGNOSIS — I2510 Atherosclerotic heart disease of native coronary artery without angina pectoris: Secondary | ICD-10-CM | POA: Diagnosis not present

## 2019-01-12 DIAGNOSIS — E1151 Type 2 diabetes mellitus with diabetic peripheral angiopathy without gangrene: Secondary | ICD-10-CM | POA: Diagnosis not present

## 2019-01-12 DIAGNOSIS — R7881 Bacteremia: Secondary | ICD-10-CM | POA: Diagnosis not present

## 2019-01-12 DIAGNOSIS — I6529 Occlusion and stenosis of unspecified carotid artery: Secondary | ICD-10-CM | POA: Diagnosis not present

## 2019-01-12 DIAGNOSIS — M1991 Primary osteoarthritis, unspecified site: Secondary | ICD-10-CM | POA: Diagnosis not present

## 2019-01-12 DIAGNOSIS — Z89432 Acquired absence of left foot: Secondary | ICD-10-CM | POA: Diagnosis not present

## 2019-01-12 DIAGNOSIS — Z9981 Dependence on supplemental oxygen: Secondary | ICD-10-CM | POA: Diagnosis not present

## 2019-01-12 DIAGNOSIS — I4891 Unspecified atrial fibrillation: Secondary | ICD-10-CM | POA: Diagnosis not present

## 2019-01-12 DIAGNOSIS — D631 Anemia in chronic kidney disease: Secondary | ICD-10-CM | POA: Diagnosis not present

## 2019-01-12 DIAGNOSIS — Z9181 History of falling: Secondary | ICD-10-CM | POA: Diagnosis not present

## 2019-01-12 DIAGNOSIS — I110 Hypertensive heart disease with heart failure: Secondary | ICD-10-CM | POA: Diagnosis not present

## 2019-01-12 DIAGNOSIS — Z7982 Long term (current) use of aspirin: Secondary | ICD-10-CM | POA: Diagnosis not present

## 2019-01-12 DIAGNOSIS — Z951 Presence of aortocoronary bypass graft: Secondary | ICD-10-CM | POA: Diagnosis not present

## 2019-01-12 DIAGNOSIS — I272 Pulmonary hypertension, unspecified: Secondary | ICD-10-CM | POA: Diagnosis not present

## 2019-01-12 DIAGNOSIS — I5033 Acute on chronic diastolic (congestive) heart failure: Secondary | ICD-10-CM | POA: Diagnosis not present

## 2019-01-12 DIAGNOSIS — J189 Pneumonia, unspecified organism: Secondary | ICD-10-CM | POA: Diagnosis not present

## 2019-01-15 ENCOUNTER — Ambulatory Visit: Payer: Medicare Other | Admitting: Sports Medicine

## 2019-01-18 ENCOUNTER — Encounter: Payer: Self-pay | Admitting: Cardiology

## 2019-01-18 ENCOUNTER — Other Ambulatory Visit: Payer: Self-pay

## 2019-01-18 ENCOUNTER — Ambulatory Visit (INDEPENDENT_AMBULATORY_CARE_PROVIDER_SITE_OTHER): Payer: Medicare Other | Admitting: Cardiology

## 2019-01-18 VITALS — BP 146/82 | HR 64 | Ht 68.0 in | Wt 218.0 lb

## 2019-01-18 DIAGNOSIS — I129 Hypertensive chronic kidney disease with stage 1 through stage 4 chronic kidney disease, or unspecified chronic kidney disease: Secondary | ICD-10-CM | POA: Diagnosis not present

## 2019-01-18 DIAGNOSIS — E669 Obesity, unspecified: Secondary | ICD-10-CM | POA: Diagnosis not present

## 2019-01-18 DIAGNOSIS — Z951 Presence of aortocoronary bypass graft: Secondary | ICD-10-CM

## 2019-01-18 DIAGNOSIS — I2510 Atherosclerotic heart disease of native coronary artery without angina pectoris: Secondary | ICD-10-CM

## 2019-01-18 DIAGNOSIS — E088 Diabetes mellitus due to underlying condition with unspecified complications: Secondary | ICD-10-CM | POA: Diagnosis not present

## 2019-01-18 DIAGNOSIS — N289 Disorder of kidney and ureter, unspecified: Secondary | ICD-10-CM | POA: Diagnosis not present

## 2019-01-18 DIAGNOSIS — N183 Chronic kidney disease, stage 3 (moderate): Secondary | ICD-10-CM

## 2019-01-18 NOTE — Progress Notes (Signed)
Cardiology Office Note:    Date:  01/18/2019   ID:  [name], DOB 1941/08/04, MRN [id number]  PCP:  Nicoletta Dress, MD  Cardiologist:  Jenean Lindau, MD   Referring MD: Nicoletta Dress, MD    ASSESSMENT:    1. Coronary artery disease involving native coronary artery of native heart without angina pectoris   2. Benign hypertension with CKD (chronic kidney disease) stage III (HCC)   3. Obesity (BMI 30-39.9)   4. Hx of CABG   5. Impaired renal function   6. Diabetes mellitus due to underlying condition with unspecified complications ([place])    PLAN:    In order of problems listed above:  1. Secondary prevention stressed with the patient.  Importance of compliance with diet and medication stressed and he vocalized understanding.  His blood pressure is stable. 2. He mentions to me that after being treated with antibiotics he has come back to his normal self.  He mentions to me that his oxygen is helping him a lot.  Whenever he uses oxygen he feels comfortable and fine.  He is in touch with his primary care physician about these issues.   Medication Adjustments/Labs and Tests Ordered: Current medicines are reviewed at length with the patient today.  Concerns regarding medicines are outlined above.  No orders of the defined types were placed in this encounter.  No orders of the defined types were placed in this encounter.    No chief complaint on file.    History of Present Illness:    [name] is a 78 y.o. male.  Patient has history of coronary artery disease.  He was evaluated by me for pedal edema.  This was recent.  Subsequently is gone to the hospital.  He was treated for pneumonitis with antibiotics.  Subsequently he has done much better.  No chest pain orthopnea, cough or PND.  Since hospital discharge she denies any issues with fever or shortness of breath.  He appears comfortable.  He denies any other symptoms and is getting significantly better and he tells me  that he is happier that after hospital discharge his quality of life is better and he is getting back to his state of health.  He mentions to me that he has been told to have oxygen.  He also mentions to me that his medications have been adjusted and has multiple questions about his medications.  Past Medical History:  Diagnosis Date  . Arthritis   . CAD (coronary artery disease)   . Carotid artery occlusion   . Diabetes mellitus Age 10  . Hyperlipidemia   . Hypertension   . Joint pain   . Peripheral vascular disease ([place])   . Ulcer     Past Surgical History:  Procedure Laterality Date  . ABDOMINAL AORTOGRAM W/LOWER EXTREMITY N/A 11/28/2017   Procedure: ABDOMINAL AORTOGRAM W/LOWER EXTREMITY;  Surgeon: Elam Dutch, MD;  Location: [facility];  Service: Cardiovascular;  Laterality: N/A;  . AMPUTATION     left first toe  . APPENDECTOMY    . CAROTID ENDARTERECTOMY Left 2000   CE  . FOOT SURGERY    . PR VEIN BYPASS GRAFT,AORTO-FEM-POP  07/2005   left femoral to dorsalis pedis artery bypass  . TRANSMETATARSAL AMPUTATION Left 12/01/2017   Procedure: TRANSMETATARSAL AMPUTATION LEFT;  Surgeon: Elam Dutch, MD;  Location: [facility];  Service: Vascular;  Laterality: Left;    Current Medications: Current Meds  Medication Sig  .  aspirin EC 81 MG tablet Take 2 tablets (162 mg total) by mouth daily.  Marland Kitchen ezetimibe (ZETIA) 10 MG tablet Take 1 tablet (10 mg total) by mouth daily.  . furosemide (LASIX) 40 MG tablet Take 1 tablet (40 mg total) by mouth daily.  Marland Kitchen linagliptin (TRADJENTA) 5 MG TABS tablet Take 5 mg by mouth.  . metoprolol tartrate (LOPRESSOR) 25 MG tablet Take 0.5 tablets by mouth 2 (two) times daily.  . nitroGLYCERIN (NITROSTAT) 0.4 MG SL tablet Place 1 tablet (0.4 mg total) under the tongue every 5 (five) minutes as needed for chest pain.  . rosuvastatin (CRESTOR) 40 MG tablet Take 20 mg by mouth daily.   . Vitamin D, Ergocalciferol, (DRISDOL) 50000 units CAPS capsule  Take 50,000 Units by mouth every 7 (seven) days. Takes on Monday  . vitamin E 400 UNIT capsule Take 400 Units by mouth daily.     Allergies:   Patient has no known allergies.   Social History   Socioeconomic History  . Marital status: Married    Spouse name: Not on file  . Number of children: Not on file  . Years of education: Not on file  . Highest education level: Not on file  Occupational History  . Not on file  Social Needs  . Financial resource strain: Not on file  . Food insecurity:    Worry: Not on file    Inability: Not on file  . Transportation needs:    Medical: Not on file    Non-medical: Not on file  Tobacco Use  . Smoking status: Former Smoker    Last attempt to quit: 11/04/1978    Years since quitting: 40.2  . Smokeless tobacco: Never Used  Substance and Sexual Activity  . Alcohol use: No  . Drug use: No  . Sexual activity: Not on file  Lifestyle  . Physical activity:    Days per week: Not on file    Minutes per session: Not on file  . Stress: Not on file  Relationships  . Social connections:    Talks on phone: Not on file    Gets together: Not on file    Attends religious service: Not on file    Active member of club or organization: Not on file    Attends meetings of clubs or organizations: Not on file    Relationship status: Not on file  Other Topics Concern  . Not on file  Social History Narrative  . Not on file     Family History: The patient's family history includes Cancer in his mother; Heart attack in his father; Heart disease in his father.  ROS:   Please see the history of present illness.    All other systems reviewed and are negative.  EKGs/Labs/Other Studies Reviewed:    The following studies were reviewed today: I discussed my findings with the patient at extensive length.   Recent Labs: 12/22/2018: NT-Pro BNP 2,360 01/06/2019: BUN 21; Creatinine, Ser 1.73; Potassium 4.3; Sodium 145  Recent Lipid Panel No results found for:  CHOL, TRIG, HDL, CHOLHDL, VLDL, LDLCALC, LDLDIRECT  Physical Exam:    VS:  BP (!) 146/82 (BP Location: Right Arm, Patient Position: Sitting, Cuff Size: Normal)   Pulse 64   Ht 5\' 8"  (1.727 m)   Wt 218 lb (98.9 kg)   SpO2 94%   BMI 33.15 kg/m     Wt Readings from Last 3 Encounters:  01/18/19 218 lb (98.9 kg)  12/22/18 220 lb 3.2  oz (99.9 kg)  12/17/18 229 lb 4.5 oz (104 kg)     GEN: Patient is in no acute distress HEENT: Normal NECK: No JVD; No carotid bruits LYMPHATICS: No lymphadenopathy CARDIAC: Hear sounds regular, 2/6 systolic murmur at the apex. RESPIRATORY:  Clear to auscultation without rales, wheezing or rhonchi  ABDOMEN: Soft, non-tender, non-distended MUSCULOSKELETAL:  No edema; No deformity  SKIN: Warm and dry NEUROLOGIC:  Alert and oriented x 3 PSYCHIATRIC:  Normal affect   Signed, Jenean Lindau, MD  01/18/2019 11:45 AM    [place]

## 2019-01-18 NOTE — Patient Instructions (Signed)

## 2019-01-19 DIAGNOSIS — B961 Klebsiella pneumoniae [K. pneumoniae] as the cause of diseases classified elsewhere: Secondary | ICD-10-CM | POA: Diagnosis not present

## 2019-01-19 DIAGNOSIS — R7881 Bacteremia: Secondary | ICD-10-CM | POA: Diagnosis not present

## 2019-01-19 DIAGNOSIS — J189 Pneumonia, unspecified organism: Secondary | ICD-10-CM | POA: Diagnosis not present

## 2019-01-19 DIAGNOSIS — I2510 Atherosclerotic heart disease of native coronary artery without angina pectoris: Secondary | ICD-10-CM | POA: Diagnosis not present

## 2019-01-19 DIAGNOSIS — E1151 Type 2 diabetes mellitus with diabetic peripheral angiopathy without gangrene: Secondary | ICD-10-CM | POA: Diagnosis not present

## 2019-01-19 DIAGNOSIS — E1122 Type 2 diabetes mellitus with diabetic chronic kidney disease: Secondary | ICD-10-CM | POA: Diagnosis not present

## 2019-01-21 DIAGNOSIS — Z09 Encounter for follow-up examination after completed treatment for conditions other than malignant neoplasm: Secondary | ICD-10-CM | POA: Diagnosis not present

## 2019-01-21 DIAGNOSIS — N189 Chronic kidney disease, unspecified: Secondary | ICD-10-CM | POA: Diagnosis not present

## 2019-01-21 DIAGNOSIS — A498 Other bacterial infections of unspecified site: Secondary | ICD-10-CM | POA: Diagnosis not present

## 2019-01-22 DIAGNOSIS — I2510 Atherosclerotic heart disease of native coronary artery without angina pectoris: Secondary | ICD-10-CM | POA: Diagnosis not present

## 2019-01-22 DIAGNOSIS — J189 Pneumonia, unspecified organism: Secondary | ICD-10-CM | POA: Diagnosis not present

## 2019-01-22 DIAGNOSIS — B961 Klebsiella pneumoniae [K. pneumoniae] as the cause of diseases classified elsewhere: Secondary | ICD-10-CM | POA: Diagnosis not present

## 2019-01-22 DIAGNOSIS — E1122 Type 2 diabetes mellitus with diabetic chronic kidney disease: Secondary | ICD-10-CM | POA: Diagnosis not present

## 2019-01-22 DIAGNOSIS — R7881 Bacteremia: Secondary | ICD-10-CM | POA: Diagnosis not present

## 2019-01-22 DIAGNOSIS — E1151 Type 2 diabetes mellitus with diabetic peripheral angiopathy without gangrene: Secondary | ICD-10-CM | POA: Diagnosis not present

## 2019-01-25 DIAGNOSIS — E1122 Type 2 diabetes mellitus with diabetic chronic kidney disease: Secondary | ICD-10-CM | POA: Diagnosis not present

## 2019-01-25 DIAGNOSIS — B961 Klebsiella pneumoniae [K. pneumoniae] as the cause of diseases classified elsewhere: Secondary | ICD-10-CM | POA: Diagnosis not present

## 2019-01-25 DIAGNOSIS — E1151 Type 2 diabetes mellitus with diabetic peripheral angiopathy without gangrene: Secondary | ICD-10-CM | POA: Diagnosis not present

## 2019-01-25 DIAGNOSIS — I2510 Atherosclerotic heart disease of native coronary artery without angina pectoris: Secondary | ICD-10-CM | POA: Diagnosis not present

## 2019-01-25 DIAGNOSIS — J189 Pneumonia, unspecified organism: Secondary | ICD-10-CM | POA: Diagnosis not present

## 2019-01-25 DIAGNOSIS — R7881 Bacteremia: Secondary | ICD-10-CM | POA: Diagnosis not present

## 2019-01-26 ENCOUNTER — Other Ambulatory Visit: Payer: Medicare Other

## 2019-01-26 DIAGNOSIS — E1122 Type 2 diabetes mellitus with diabetic chronic kidney disease: Secondary | ICD-10-CM | POA: Diagnosis not present

## 2019-01-26 DIAGNOSIS — R7881 Bacteremia: Secondary | ICD-10-CM | POA: Diagnosis not present

## 2019-01-26 DIAGNOSIS — B961 Klebsiella pneumoniae [K. pneumoniae] as the cause of diseases classified elsewhere: Secondary | ICD-10-CM | POA: Diagnosis not present

## 2019-01-26 DIAGNOSIS — E1151 Type 2 diabetes mellitus with diabetic peripheral angiopathy without gangrene: Secondary | ICD-10-CM | POA: Diagnosis not present

## 2019-01-26 DIAGNOSIS — J189 Pneumonia, unspecified organism: Secondary | ICD-10-CM | POA: Diagnosis not present

## 2019-01-26 DIAGNOSIS — I2510 Atherosclerotic heart disease of native coronary artery without angina pectoris: Secondary | ICD-10-CM | POA: Diagnosis not present

## 2019-01-28 DIAGNOSIS — E1151 Type 2 diabetes mellitus with diabetic peripheral angiopathy without gangrene: Secondary | ICD-10-CM | POA: Diagnosis not present

## 2019-01-28 DIAGNOSIS — B961 Klebsiella pneumoniae [K. pneumoniae] as the cause of diseases classified elsewhere: Secondary | ICD-10-CM | POA: Diagnosis not present

## 2019-01-28 DIAGNOSIS — I2510 Atherosclerotic heart disease of native coronary artery without angina pectoris: Secondary | ICD-10-CM | POA: Diagnosis not present

## 2019-01-28 DIAGNOSIS — J189 Pneumonia, unspecified organism: Secondary | ICD-10-CM | POA: Diagnosis not present

## 2019-01-28 DIAGNOSIS — E1122 Type 2 diabetes mellitus with diabetic chronic kidney disease: Secondary | ICD-10-CM | POA: Diagnosis not present

## 2019-01-28 DIAGNOSIS — R7881 Bacteremia: Secondary | ICD-10-CM | POA: Diagnosis not present

## 2019-01-29 DIAGNOSIS — E1151 Type 2 diabetes mellitus with diabetic peripheral angiopathy without gangrene: Secondary | ICD-10-CM | POA: Diagnosis not present

## 2019-01-29 DIAGNOSIS — I2510 Atherosclerotic heart disease of native coronary artery without angina pectoris: Secondary | ICD-10-CM | POA: Diagnosis not present

## 2019-01-29 DIAGNOSIS — R7881 Bacteremia: Secondary | ICD-10-CM | POA: Diagnosis not present

## 2019-01-29 DIAGNOSIS — J189 Pneumonia, unspecified organism: Secondary | ICD-10-CM | POA: Diagnosis not present

## 2019-01-29 DIAGNOSIS — E1122 Type 2 diabetes mellitus with diabetic chronic kidney disease: Secondary | ICD-10-CM | POA: Diagnosis not present

## 2019-01-29 DIAGNOSIS — B961 Klebsiella pneumoniae [K. pneumoniae] as the cause of diseases classified elsewhere: Secondary | ICD-10-CM | POA: Diagnosis not present

## 2019-01-29 DIAGNOSIS — I5020 Unspecified systolic (congestive) heart failure: Secondary | ICD-10-CM | POA: Diagnosis not present

## 2019-02-02 DIAGNOSIS — R7881 Bacteremia: Secondary | ICD-10-CM | POA: Diagnosis not present

## 2019-02-02 DIAGNOSIS — I2510 Atherosclerotic heart disease of native coronary artery without angina pectoris: Secondary | ICD-10-CM | POA: Diagnosis not present

## 2019-02-02 DIAGNOSIS — E1122 Type 2 diabetes mellitus with diabetic chronic kidney disease: Secondary | ICD-10-CM | POA: Diagnosis not present

## 2019-02-02 DIAGNOSIS — J189 Pneumonia, unspecified organism: Secondary | ICD-10-CM | POA: Diagnosis not present

## 2019-02-02 DIAGNOSIS — B961 Klebsiella pneumoniae [K. pneumoniae] as the cause of diseases classified elsewhere: Secondary | ICD-10-CM | POA: Diagnosis not present

## 2019-02-02 DIAGNOSIS — E1151 Type 2 diabetes mellitus with diabetic peripheral angiopathy without gangrene: Secondary | ICD-10-CM | POA: Diagnosis not present

## 2019-02-03 ENCOUNTER — Telehealth: Payer: Self-pay | Admitting: *Deleted

## 2019-02-03 NOTE — Telephone Encounter (Signed)
Call from patient's wife. She is questioning/ "very concerned" that  patient is to come back for follow up vascular studies and office visit in one year instead of 6 months. She states that in the past he has "always been checked every 6 months". Read last office appointment notes from 12/2018 appointment that "patient  to return in 1 year". ALSO instructed that she was directed then to call sooner if any problems. TODAY she states that he is doing well no problems, "but that could change over night". I instructed her that checking extremities for temperature, color, sensation, skin intact or developing ulcer is a continual and daily practice. Notify this office if any changes to be seen in office sooner. She verbalized understanding but stressed that she wanted her concerns noted. I repeated that she is to continue monitoring circulation and call this office if any problems.

## 2019-02-04 DIAGNOSIS — R7881 Bacteremia: Secondary | ICD-10-CM | POA: Diagnosis not present

## 2019-02-04 DIAGNOSIS — I5020 Unspecified systolic (congestive) heart failure: Secondary | ICD-10-CM | POA: Diagnosis not present

## 2019-02-04 DIAGNOSIS — J189 Pneumonia, unspecified organism: Secondary | ICD-10-CM | POA: Diagnosis not present

## 2019-02-04 DIAGNOSIS — E1122 Type 2 diabetes mellitus with diabetic chronic kidney disease: Secondary | ICD-10-CM | POA: Diagnosis not present

## 2019-02-04 DIAGNOSIS — B961 Klebsiella pneumoniae [K. pneumoniae] as the cause of diseases classified elsewhere: Secondary | ICD-10-CM | POA: Diagnosis not present

## 2019-02-04 DIAGNOSIS — I2510 Atherosclerotic heart disease of native coronary artery without angina pectoris: Secondary | ICD-10-CM | POA: Diagnosis not present

## 2019-02-04 DIAGNOSIS — E1151 Type 2 diabetes mellitus with diabetic peripheral angiopathy without gangrene: Secondary | ICD-10-CM | POA: Diagnosis not present

## 2019-02-04 IMAGING — CR DG CHEST 2V
2 series · 2 of 2 positions shown · non-contrast
Comparison: 11/18/2017

CLINICAL DATA: Cough for several days

EXAM:
CHEST  2 VIEW

[chest lat]
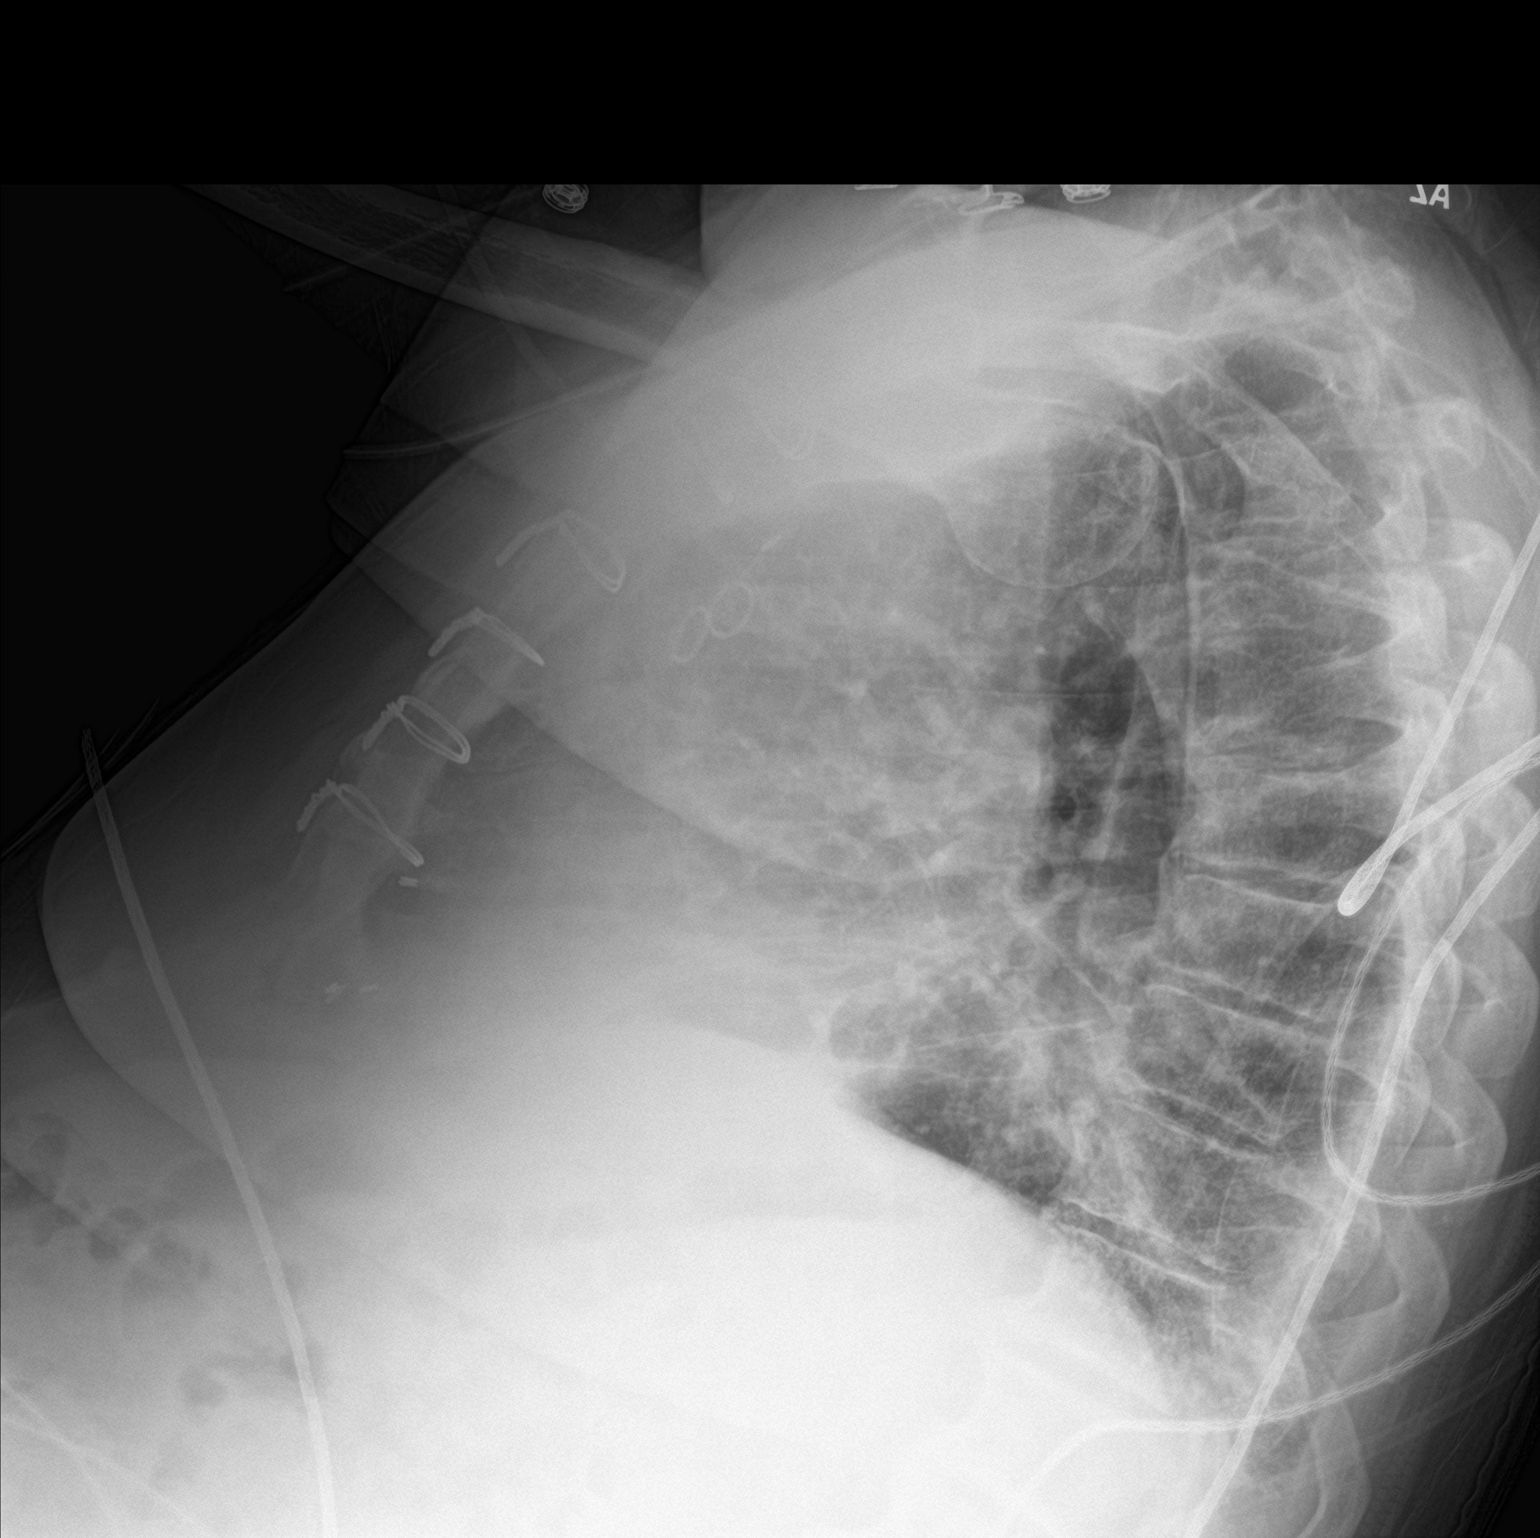

[chest ap]
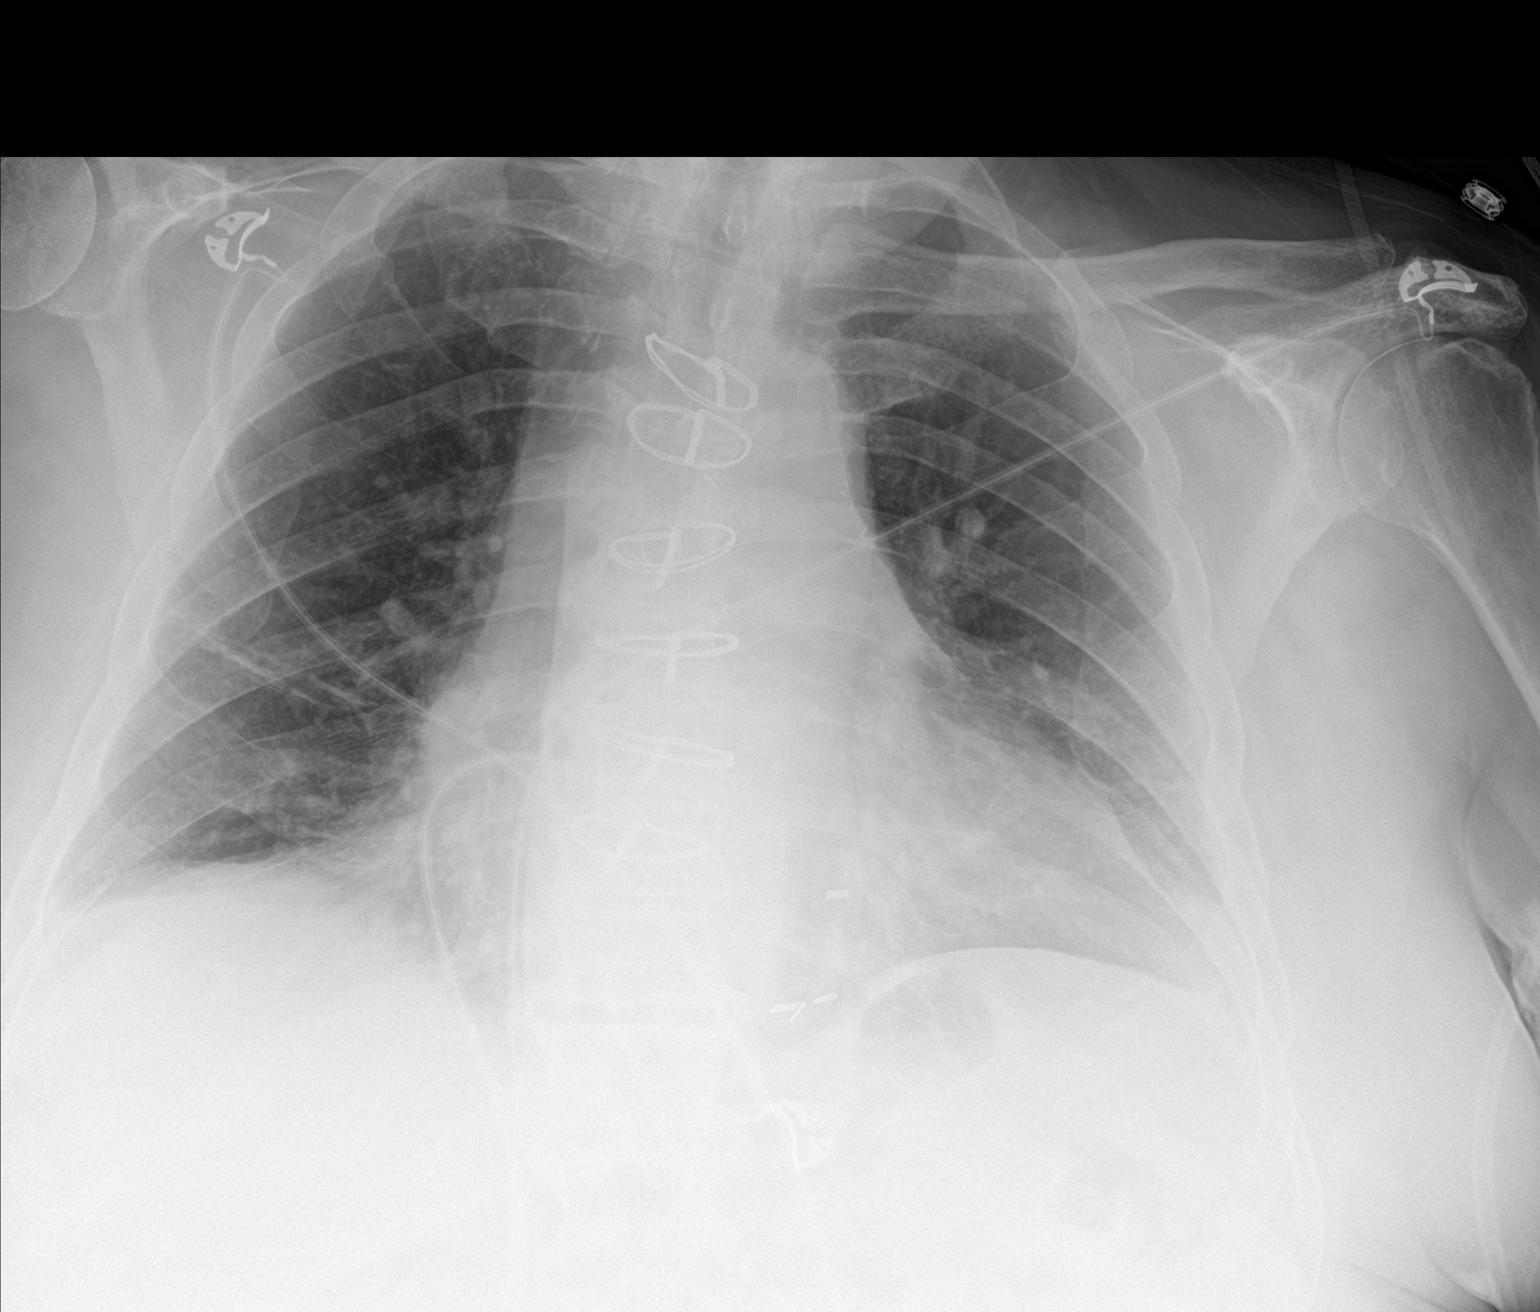

[2 of 2 positions shown; findings below may reference images not displayed]

FINDINGS: Cardiac shadow is prominent but accentuated by the portable
technique. Postsurgical changes are again seen. Mild bibasilar
atelectasis is noted. No sizable effusion is seen no acute bony
abnormality is noted.
IMPRESSION: Stable bibasilar changes likely representing atelectasis.

## 2019-02-05 DIAGNOSIS — I270 Primary pulmonary hypertension: Secondary | ICD-10-CM | POA: Diagnosis not present

## 2019-02-05 DIAGNOSIS — R5383 Other fatigue: Secondary | ICD-10-CM | POA: Diagnosis not present

## 2019-02-05 DIAGNOSIS — R0602 Shortness of breath: Secondary | ICD-10-CM | POA: Diagnosis not present

## 2019-02-05 DIAGNOSIS — G4733 Obstructive sleep apnea (adult) (pediatric): Secondary | ICD-10-CM | POA: Diagnosis not present

## 2019-02-05 DIAGNOSIS — I5042 Chronic combined systolic (congestive) and diastolic (congestive) heart failure: Secondary | ICD-10-CM | POA: Diagnosis not present

## 2019-02-08 ENCOUNTER — Ambulatory Visit: Payer: Medicare Other | Admitting: Podiatry

## 2019-02-08 ENCOUNTER — Ambulatory Visit: Payer: Medicare Other | Admitting: Family

## 2019-02-08 ENCOUNTER — Other Ambulatory Visit (HOSPITAL_COMMUNITY): Payer: Medicare Other

## 2019-02-08 ENCOUNTER — Encounter (HOSPITAL_COMMUNITY): Payer: Medicare Other

## 2019-02-08 DIAGNOSIS — I5020 Unspecified systolic (congestive) heart failure: Secondary | ICD-10-CM | POA: Diagnosis not present

## 2019-02-08 DIAGNOSIS — E1151 Type 2 diabetes mellitus with diabetic peripheral angiopathy without gangrene: Secondary | ICD-10-CM | POA: Diagnosis not present

## 2019-02-08 DIAGNOSIS — B961 Klebsiella pneumoniae [K. pneumoniae] as the cause of diseases classified elsewhere: Secondary | ICD-10-CM | POA: Diagnosis not present

## 2019-02-08 DIAGNOSIS — R7881 Bacteremia: Secondary | ICD-10-CM | POA: Diagnosis not present

## 2019-02-08 DIAGNOSIS — J189 Pneumonia, unspecified organism: Secondary | ICD-10-CM | POA: Diagnosis not present

## 2019-02-08 DIAGNOSIS — I2510 Atherosclerotic heart disease of native coronary artery without angina pectoris: Secondary | ICD-10-CM | POA: Diagnosis not present

## 2019-02-08 DIAGNOSIS — E1122 Type 2 diabetes mellitus with diabetic chronic kidney disease: Secondary | ICD-10-CM | POA: Diagnosis not present

## 2019-02-09 DIAGNOSIS — B961 Klebsiella pneumoniae [K. pneumoniae] as the cause of diseases classified elsewhere: Secondary | ICD-10-CM | POA: Diagnosis not present

## 2019-02-09 DIAGNOSIS — E1151 Type 2 diabetes mellitus with diabetic peripheral angiopathy without gangrene: Secondary | ICD-10-CM | POA: Diagnosis not present

## 2019-02-09 DIAGNOSIS — I2510 Atherosclerotic heart disease of native coronary artery without angina pectoris: Secondary | ICD-10-CM | POA: Diagnosis not present

## 2019-02-09 DIAGNOSIS — J189 Pneumonia, unspecified organism: Secondary | ICD-10-CM | POA: Diagnosis not present

## 2019-02-09 DIAGNOSIS — E1122 Type 2 diabetes mellitus with diabetic chronic kidney disease: Secondary | ICD-10-CM | POA: Diagnosis not present

## 2019-02-09 DIAGNOSIS — R7881 Bacteremia: Secondary | ICD-10-CM | POA: Diagnosis not present

## 2019-02-10 ENCOUNTER — Telehealth: Payer: Self-pay | Admitting: Cardiology

## 2019-02-10 NOTE — Telephone Encounter (Signed)
Amy with [facility] called to report a blood pressure on [name] -- 123/45.  Amy states to call the patient if any changes are to be made

## 2019-02-11 ENCOUNTER — Telehealth: Payer: Self-pay

## 2019-02-11 DIAGNOSIS — B961 Klebsiella pneumoniae [K. pneumoniae] as the cause of diseases classified elsewhere: Secondary | ICD-10-CM | POA: Diagnosis not present

## 2019-02-11 DIAGNOSIS — Z89432 Acquired absence of left foot: Secondary | ICD-10-CM | POA: Diagnosis not present

## 2019-02-11 DIAGNOSIS — R7881 Bacteremia: Secondary | ICD-10-CM | POA: Diagnosis not present

## 2019-02-11 DIAGNOSIS — I272 Pulmonary hypertension, unspecified: Secondary | ICD-10-CM | POA: Diagnosis not present

## 2019-02-11 DIAGNOSIS — E1151 Type 2 diabetes mellitus with diabetic peripheral angiopathy without gangrene: Secondary | ICD-10-CM | POA: Diagnosis not present

## 2019-02-11 DIAGNOSIS — M1991 Primary osteoarthritis, unspecified site: Secondary | ICD-10-CM | POA: Diagnosis not present

## 2019-02-11 DIAGNOSIS — Z7984 Long term (current) use of oral hypoglycemic drugs: Secondary | ICD-10-CM | POA: Diagnosis not present

## 2019-02-11 DIAGNOSIS — Z951 Presence of aortocoronary bypass graft: Secondary | ICD-10-CM | POA: Diagnosis not present

## 2019-02-11 DIAGNOSIS — N184 Chronic kidney disease, stage 4 (severe): Secondary | ICD-10-CM | POA: Diagnosis not present

## 2019-02-11 DIAGNOSIS — D631 Anemia in chronic kidney disease: Secondary | ICD-10-CM | POA: Diagnosis not present

## 2019-02-11 DIAGNOSIS — I5033 Acute on chronic diastolic (congestive) heart failure: Secondary | ICD-10-CM | POA: Diagnosis not present

## 2019-02-11 DIAGNOSIS — Z9981 Dependence on supplemental oxygen: Secondary | ICD-10-CM | POA: Diagnosis not present

## 2019-02-11 DIAGNOSIS — J189 Pneumonia, unspecified organism: Secondary | ICD-10-CM | POA: Diagnosis not present

## 2019-02-11 DIAGNOSIS — Z9181 History of falling: Secondary | ICD-10-CM | POA: Diagnosis not present

## 2019-02-11 DIAGNOSIS — I4891 Unspecified atrial fibrillation: Secondary | ICD-10-CM | POA: Diagnosis not present

## 2019-02-11 DIAGNOSIS — I110 Hypertensive heart disease with heart failure: Secondary | ICD-10-CM | POA: Diagnosis not present

## 2019-02-11 DIAGNOSIS — Z7982 Long term (current) use of aspirin: Secondary | ICD-10-CM | POA: Diagnosis not present

## 2019-02-11 DIAGNOSIS — I2510 Atherosclerotic heart disease of native coronary artery without angina pectoris: Secondary | ICD-10-CM | POA: Diagnosis not present

## 2019-02-11 DIAGNOSIS — I6529 Occlusion and stenosis of unspecified carotid artery: Secondary | ICD-10-CM | POA: Diagnosis not present

## 2019-02-11 DIAGNOSIS — E1122 Type 2 diabetes mellitus with diabetic chronic kidney disease: Secondary | ICD-10-CM | POA: Diagnosis not present

## 2019-02-11 NOTE — Telephone Encounter (Signed)
Returned call from [name] wife, [name] regarding her concerned with his BP reading of 123/45. Patient's BP is checked daily, today 133/25, yesterday 130/52, 2 days ago 123/45.  She states that patient has been feeling fine, at his baseline. He  is in a home physical therapy program, using his usual home O2 at [address] and taking all meds as ordered and no other concerns. Informed that this does not sound problematic, information will be routed to Dr. Julien Nordmann office for review and she most likely won't receive a return call from [place] on this. Scheduled for follow-up visit 02/22/2019, she verbalizes understanding of this.

## 2019-02-12 NOTE — Telephone Encounter (Signed)
bp is fine

## 2019-02-15 DIAGNOSIS — J449 Chronic obstructive pulmonary disease, unspecified: Secondary | ICD-10-CM | POA: Diagnosis not present

## 2019-02-15 DIAGNOSIS — G4733 Obstructive sleep apnea (adult) (pediatric): Secondary | ICD-10-CM | POA: Diagnosis not present

## 2019-02-15 DIAGNOSIS — I5042 Chronic combined systolic (congestive) and diastolic (congestive) heart failure: Secondary | ICD-10-CM | POA: Diagnosis not present

## 2019-02-15 DIAGNOSIS — I270 Primary pulmonary hypertension: Secondary | ICD-10-CM | POA: Diagnosis not present

## 2019-02-15 DIAGNOSIS — R5383 Other fatigue: Secondary | ICD-10-CM | POA: Diagnosis not present

## 2019-02-18 DIAGNOSIS — B961 Klebsiella pneumoniae [K. pneumoniae] as the cause of diseases classified elsewhere: Secondary | ICD-10-CM | POA: Diagnosis not present

## 2019-02-18 DIAGNOSIS — E1151 Type 2 diabetes mellitus with diabetic peripheral angiopathy without gangrene: Secondary | ICD-10-CM | POA: Diagnosis not present

## 2019-02-18 DIAGNOSIS — I2510 Atherosclerotic heart disease of native coronary artery without angina pectoris: Secondary | ICD-10-CM | POA: Diagnosis not present

## 2019-02-18 DIAGNOSIS — R7881 Bacteremia: Secondary | ICD-10-CM | POA: Diagnosis not present

## 2019-02-18 DIAGNOSIS — E1122 Type 2 diabetes mellitus with diabetic chronic kidney disease: Secondary | ICD-10-CM | POA: Diagnosis not present

## 2019-02-18 DIAGNOSIS — J189 Pneumonia, unspecified organism: Secondary | ICD-10-CM | POA: Diagnosis not present

## 2019-02-22 ENCOUNTER — Ambulatory Visit: Payer: Medicare Other | Admitting: Cardiology

## 2019-02-23 DIAGNOSIS — E1151 Type 2 diabetes mellitus with diabetic peripheral angiopathy without gangrene: Secondary | ICD-10-CM | POA: Diagnosis not present

## 2019-02-23 DIAGNOSIS — E1122 Type 2 diabetes mellitus with diabetic chronic kidney disease: Secondary | ICD-10-CM | POA: Diagnosis not present

## 2019-02-23 DIAGNOSIS — R7881 Bacteremia: Secondary | ICD-10-CM | POA: Diagnosis not present

## 2019-02-23 DIAGNOSIS — I2510 Atherosclerotic heart disease of native coronary artery without angina pectoris: Secondary | ICD-10-CM | POA: Diagnosis not present

## 2019-02-23 DIAGNOSIS — B961 Klebsiella pneumoniae [K. pneumoniae] as the cause of diseases classified elsewhere: Secondary | ICD-10-CM | POA: Diagnosis not present

## 2019-02-23 DIAGNOSIS — J189 Pneumonia, unspecified organism: Secondary | ICD-10-CM | POA: Diagnosis not present

## 2019-03-01 DIAGNOSIS — Z711 Person with feared health complaint in whom no diagnosis is made: Secondary | ICD-10-CM | POA: Diagnosis not present

## 2019-03-01 DIAGNOSIS — G4733 Obstructive sleep apnea (adult) (pediatric): Secondary | ICD-10-CM | POA: Diagnosis not present

## 2019-03-02 ENCOUNTER — Ambulatory Visit (INDEPENDENT_AMBULATORY_CARE_PROVIDER_SITE_OTHER): Payer: Medicare Other | Admitting: Podiatry

## 2019-03-02 ENCOUNTER — Encounter: Payer: Self-pay | Admitting: Podiatry

## 2019-03-02 ENCOUNTER — Other Ambulatory Visit: Payer: Self-pay

## 2019-03-02 VITALS — BP 107/41 | HR 82 | Temp 96.6°F | Resp 16

## 2019-03-02 DIAGNOSIS — Z89432 Acquired absence of left foot: Secondary | ICD-10-CM

## 2019-03-02 DIAGNOSIS — B351 Tinea unguium: Secondary | ICD-10-CM

## 2019-03-02 DIAGNOSIS — E1140 Type 2 diabetes mellitus with diabetic neuropathy, unspecified: Secondary | ICD-10-CM

## 2019-03-02 DIAGNOSIS — L84 Corns and callosities: Secondary | ICD-10-CM | POA: Diagnosis not present

## 2019-03-02 DIAGNOSIS — B353 Tinea pedis: Secondary | ICD-10-CM | POA: Diagnosis not present

## 2019-03-02 MED ORDER — KETOCONAZOLE 2 % EX CREA: TOPICAL_CREAM | CUTANEOUS | 0 refills | Status: AC

## 2019-03-02 NOTE — Progress Notes (Signed)
Patient ID: [name], male   DOB: 1941-10-30, 78 y.o.   MRN: [id number]   Patient picked up his corrected L5000 orthotic with transmet filler.  Patient was confused and believed he was supposed to be getting a new pair of shoes for 2020 as well.  After reviewing patient received a pair of shoes in January explained to the patient that even though the shoes were ordered last year he picked them up this year and that is when the charges were placed making the shoes for 2020.  Patient was frustrated but wife stated that they knew they were for 2020 and she would remind her husband of that conversation on the way home.

## 2019-03-02 NOTE — Progress Notes (Signed)
Subjective:  Patient ID: [name], male    DOB: July 17, 1941,  MRN: [id number]  Chief Complaint  Patient presents with  . debride    BL nails trimming -pt denies any pedal issues  . Diabetes    FBS: 105 A1C: 7 PCP: Delena Bali -pt states he was admitted to ER 4 wks ago for COPD and CHF    78 y.o. male presents  for diabetic foot care. Last AMBS was 105.Marland Kitchen Reports numbness and tingling in their feet. Denies cramping in legs and thighs.  Review of Systems: Negative except as noted in the HPI. Denies N/V/F/Ch.  Past Medical History:  Diagnosis Date  . Arthritis   . CAD (coronary artery disease)   . Carotid artery occlusion   . Diabetes mellitus Age 24  . Hyperlipidemia   . Hypertension   . Joint pain   . Peripheral vascular disease ([place])   . Ulcer     Current Outpatient Medications:  .  aspirin EC 81 MG tablet, Take 2 tablets (162 mg total) by mouth daily., Disp: 90 tablet, Rfl: 1 .  ezetimibe (ZETIA) 10 MG tablet, Take 1 tablet (10 mg total) by mouth daily., Disp: 90 tablet, Rfl: 3 .  furosemide (LASIX) 40 MG tablet, Take 1 tablet (40 mg total) by mouth daily., Disp: 30 tablet, Rfl: 3 .  linagliptin (TRADJENTA) 5 MG TABS tablet, Take 5 mg by mouth., Disp: , Rfl:  .  metoprolol tartrate (LOPRESSOR) 25 MG tablet, Take 0.5 tablets by mouth 2 (two) times daily., Disp: , Rfl:  .  nitroGLYCERIN (NITROSTAT) 0.4 MG SL tablet, Place 1 tablet (0.4 mg total) under the tongue every 5 (five) minutes as needed for chest pain., Disp: 25 tablet, Rfl: 11 .  rosuvastatin (CRESTOR) 40 MG tablet, Take 20 mg by mouth daily. , Disp: , Rfl:  .  spironolactone (ALDACTONE) 25 MG tablet, Take 25 mg by mouth daily., Disp: , Rfl:  .  SYMBICORT 160-4.5 MCG/ACT inhaler, , Disp: , Rfl:  .  Vitamin D, Ergocalciferol, (DRISDOL) 50000 units CAPS capsule, Take 50,000 Units by mouth every 7 (seven) days. Takes on Monday, Disp: , Rfl:  .  vitamin E 400 UNIT capsule, Take 400 Units by mouth daily., Disp: , Rfl:  .   ketoconazole (NIZORAL) 2 % cream, Apply 1 fingertip amount to each foot daily., Disp: 30 g, Rfl: 0  Social History   Tobacco Use  Smoking Status Former Smoker  . Last attempt to quit: 11/04/1978  . Years since quitting: 40.3  Smokeless Tobacco Never Used    No Known Allergies Objective:   Vitals:   03/02/19 0946  BP: (!) 107/41  Pulse: 82  Resp: 16  Temp: (!) 96.6 F (35.9 C)   There is no height or weight on file to calculate BMI. Constitutional Well developed. Well nourished.  Vascular Dorsalis pedis pulses present 1+ bilaterally  Posterior tibial pulses present 1+ bilaterally  Pedal hair growth diminished. Capillary refill normal to all digits.  No cyanosis or clubbing noted.  Neurologic Normal speech. Oriented to person, place, and time. Epicritic sensation to light touch grossly present bilaterally. Protective sensation with 5.07 monofilament  absent bilaterally.  Dermatologic Nails elongated, thickened, dystrophic right foot. No open wounds. HPK R 5th MPJ Xerosis with scaling plantar foot bilat.  Orthopedic: Normal joint ROM without pain or crepitus bilaterally. Left transmetatarsal amputation noted.   Assessment:   1. Type 2 diabetes, controlled, with neuropathy ([place])   2. History of transmetatarsal amputation  of left foot ([place])   3. Callus of foot   4. Tinea pedis of both feet   5. Onychomycosis    Plan:  Patient was evaluated and treated and all questions answered.  Diabetes with Amputation Hx, Onychomycosis -Diabetic risk level 3 -Nails x5 debrided sharply and manually with large nail nipper and rotary burr.   Procedure: Nail Debridement Rationale: Patient meets criteria for routine foot care due to amputation hx Type of Debridement: manual, sharp debridement. Instrumentation: Nail nipper, rotary burr. Number of Nails: 5   Procedure: Paring of Lesion Rationale: painful hyperkeratotic lesion Type of Debridement: manual, sharp debridement.  Instrumentation: 312 blade Number of Lesions: 1   Tinea Pedis -Rx Ketoconazole. Educated on use.  Return in about 9 weeks (around 05/04/2019) for Diabetic Foot Care.

## 2019-03-03 DIAGNOSIS — R7881 Bacteremia: Secondary | ICD-10-CM | POA: Diagnosis not present

## 2019-03-03 DIAGNOSIS — E1122 Type 2 diabetes mellitus with diabetic chronic kidney disease: Secondary | ICD-10-CM | POA: Diagnosis not present

## 2019-03-03 DIAGNOSIS — I1310 Hypertensive heart and chronic kidney disease without heart failure, with stage 1 through stage 4 chronic kidney disease, or unspecified chronic kidney disease: Secondary | ICD-10-CM | POA: Diagnosis not present

## 2019-03-03 DIAGNOSIS — J189 Pneumonia, unspecified organism: Secondary | ICD-10-CM | POA: Diagnosis not present

## 2019-03-03 DIAGNOSIS — G4700 Insomnia, unspecified: Secondary | ICD-10-CM | POA: Diagnosis not present

## 2019-03-03 DIAGNOSIS — Z1331 Encounter for screening for depression: Secondary | ICD-10-CM | POA: Diagnosis not present

## 2019-03-03 DIAGNOSIS — E785 Hyperlipidemia, unspecified: Secondary | ICD-10-CM | POA: Diagnosis not present

## 2019-03-03 DIAGNOSIS — B961 Klebsiella pneumoniae [K. pneumoniae] as the cause of diseases classified elsewhere: Secondary | ICD-10-CM | POA: Diagnosis not present

## 2019-03-03 DIAGNOSIS — E1151 Type 2 diabetes mellitus with diabetic peripheral angiopathy without gangrene: Secondary | ICD-10-CM | POA: Diagnosis not present

## 2019-03-03 DIAGNOSIS — I2510 Atherosclerotic heart disease of native coronary artery without angina pectoris: Secondary | ICD-10-CM | POA: Diagnosis not present

## 2019-03-03 DIAGNOSIS — Z9181 History of falling: Secondary | ICD-10-CM | POA: Diagnosis not present

## 2019-03-08 DIAGNOSIS — Z951 Presence of aortocoronary bypass graft: Secondary | ICD-10-CM | POA: Diagnosis not present

## 2019-03-08 DIAGNOSIS — R0609 Other forms of dyspnea: Secondary | ICD-10-CM | POA: Insufficient documentation

## 2019-03-08 DIAGNOSIS — E1122 Type 2 diabetes mellitus with diabetic chronic kidney disease: Secondary | ICD-10-CM | POA: Diagnosis not present

## 2019-03-08 DIAGNOSIS — N183 Chronic kidney disease, stage 3 unspecified: Secondary | ICD-10-CM | POA: Insufficient documentation

## 2019-03-08 DIAGNOSIS — I129 Hypertensive chronic kidney disease with stage 1 through stage 4 chronic kidney disease, or unspecified chronic kidney disease: Secondary | ICD-10-CM | POA: Diagnosis not present

## 2019-03-08 DIAGNOSIS — R0600 Dyspnea, unspecified: Secondary | ICD-10-CM | POA: Diagnosis not present

## 2019-03-08 DIAGNOSIS — E785 Hyperlipidemia, unspecified: Secondary | ICD-10-CM | POA: Diagnosis not present

## 2019-03-08 DIAGNOSIS — I2510 Atherosclerotic heart disease of native coronary artery without angina pectoris: Secondary | ICD-10-CM | POA: Insufficient documentation

## 2019-03-09 DIAGNOSIS — Z136 Encounter for screening for cardiovascular disorders: Secondary | ICD-10-CM | POA: Diagnosis not present

## 2019-03-09 DIAGNOSIS — E785 Hyperlipidemia, unspecified: Secondary | ICD-10-CM | POA: Diagnosis not present

## 2019-03-09 DIAGNOSIS — Z125 Encounter for screening for malignant neoplasm of prostate: Secondary | ICD-10-CM | POA: Diagnosis not present

## 2019-03-09 DIAGNOSIS — R7881 Bacteremia: Secondary | ICD-10-CM | POA: Diagnosis not present

## 2019-03-09 DIAGNOSIS — I2510 Atherosclerotic heart disease of native coronary artery without angina pectoris: Secondary | ICD-10-CM | POA: Diagnosis not present

## 2019-03-09 DIAGNOSIS — Z0000 Encounter for general adult medical examination without abnormal findings: Secondary | ICD-10-CM | POA: Diagnosis not present

## 2019-03-09 DIAGNOSIS — Z1331 Encounter for screening for depression: Secondary | ICD-10-CM | POA: Diagnosis not present

## 2019-03-09 DIAGNOSIS — Z9181 History of falling: Secondary | ICD-10-CM | POA: Diagnosis not present

## 2019-03-09 DIAGNOSIS — B961 Klebsiella pneumoniae [K. pneumoniae] as the cause of diseases classified elsewhere: Secondary | ICD-10-CM | POA: Diagnosis not present

## 2019-03-09 DIAGNOSIS — E1122 Type 2 diabetes mellitus with diabetic chronic kidney disease: Secondary | ICD-10-CM | POA: Diagnosis not present

## 2019-03-09 DIAGNOSIS — E1151 Type 2 diabetes mellitus with diabetic peripheral angiopathy without gangrene: Secondary | ICD-10-CM | POA: Diagnosis not present

## 2019-03-09 DIAGNOSIS — J189 Pneumonia, unspecified organism: Secondary | ICD-10-CM | POA: Diagnosis not present

## 2019-03-11 DIAGNOSIS — J449 Chronic obstructive pulmonary disease, unspecified: Secondary | ICD-10-CM | POA: Diagnosis not present

## 2019-03-11 DIAGNOSIS — I5042 Chronic combined systolic (congestive) and diastolic (congestive) heart failure: Secondary | ICD-10-CM | POA: Diagnosis not present

## 2019-03-11 DIAGNOSIS — R7881 Bacteremia: Secondary | ICD-10-CM | POA: Diagnosis not present

## 2019-03-11 DIAGNOSIS — I270 Primary pulmonary hypertension: Secondary | ICD-10-CM | POA: Diagnosis not present

## 2019-03-11 DIAGNOSIS — G4733 Obstructive sleep apnea (adult) (pediatric): Secondary | ICD-10-CM | POA: Diagnosis not present

## 2019-03-11 DIAGNOSIS — R5383 Other fatigue: Secondary | ICD-10-CM | POA: Diagnosis not present

## 2019-03-11 DIAGNOSIS — E1151 Type 2 diabetes mellitus with diabetic peripheral angiopathy without gangrene: Secondary | ICD-10-CM | POA: Diagnosis not present

## 2019-03-11 DIAGNOSIS — B961 Klebsiella pneumoniae [K. pneumoniae] as the cause of diseases classified elsewhere: Secondary | ICD-10-CM | POA: Diagnosis not present

## 2019-03-11 DIAGNOSIS — I2510 Atherosclerotic heart disease of native coronary artery without angina pectoris: Secondary | ICD-10-CM | POA: Diagnosis not present

## 2019-03-11 DIAGNOSIS — J189 Pneumonia, unspecified organism: Secondary | ICD-10-CM | POA: Diagnosis not present

## 2019-03-11 DIAGNOSIS — E1122 Type 2 diabetes mellitus with diabetic chronic kidney disease: Secondary | ICD-10-CM | POA: Diagnosis not present

## 2019-03-15 ENCOUNTER — Other Ambulatory Visit: Payer: Self-pay | Admitting: Cardiology

## 2019-03-15 DIAGNOSIS — I2510 Atherosclerotic heart disease of native coronary artery without angina pectoris: Secondary | ICD-10-CM | POA: Diagnosis not present

## 2019-03-15 DIAGNOSIS — I10 Essential (primary) hypertension: Secondary | ICD-10-CM | POA: Diagnosis not present

## 2019-03-15 MED ORDER — FUROSEMIDE 40 MG PO TABS: 40.0000 mg | ORAL_TABLET | Freq: Every day | ORAL | 1 refills | Status: AC

## 2019-03-15 NOTE — Telephone Encounter (Signed)
Rx refill sent to pharmacy. 

## 2019-03-15 NOTE — Telephone Encounter (Signed)
°*  STAT* If patient is at the pharmacy, call can be transferred to refill team.   1. Which medications need to be refilled? (please list name of each medication and dose if known) furosemide (LASIX) 40 MG tablet   2. Which pharmacy/location (including street and city if local pharmacy) is medication to be sent to?  [place], [place] - [place] [phone] (Phone) [phone] (Fax)    3. Do they need a 30 day or 90 day supply? 90 day

## 2019-03-31 DIAGNOSIS — N183 Chronic kidney disease, stage 3 (moderate): Secondary | ICD-10-CM | POA: Diagnosis not present

## 2019-04-07 DIAGNOSIS — I1310 Hypertensive heart and chronic kidney disease without heart failure, with stage 1 through stage 4 chronic kidney disease, or unspecified chronic kidney disease: Secondary | ICD-10-CM | POA: Diagnosis not present

## 2019-04-07 DIAGNOSIS — E1122 Type 2 diabetes mellitus with diabetic chronic kidney disease: Secondary | ICD-10-CM | POA: Diagnosis not present

## 2019-04-07 DIAGNOSIS — E785 Hyperlipidemia, unspecified: Secondary | ICD-10-CM | POA: Diagnosis not present

## 2019-04-07 DIAGNOSIS — G4733 Obstructive sleep apnea (adult) (pediatric): Secondary | ICD-10-CM | POA: Diagnosis not present

## 2019-04-07 DIAGNOSIS — Z125 Encounter for screening for malignant neoplasm of prostate: Secondary | ICD-10-CM | POA: Diagnosis not present

## 2019-04-07 DIAGNOSIS — R600 Localized edema: Secondary | ICD-10-CM | POA: Diagnosis not present

## 2019-04-08 DIAGNOSIS — I129 Hypertensive chronic kidney disease with stage 1 through stage 4 chronic kidney disease, or unspecified chronic kidney disease: Secondary | ICD-10-CM | POA: Diagnosis not present

## 2019-04-08 DIAGNOSIS — N179 Acute kidney failure, unspecified: Secondary | ICD-10-CM | POA: Diagnosis not present

## 2019-04-08 DIAGNOSIS — E1122 Type 2 diabetes mellitus with diabetic chronic kidney disease: Secondary | ICD-10-CM | POA: Diagnosis not present

## 2019-04-08 DIAGNOSIS — N183 Chronic kidney disease, stage 3 (moderate): Secondary | ICD-10-CM | POA: Diagnosis not present

## 2019-04-14 DIAGNOSIS — R5383 Other fatigue: Secondary | ICD-10-CM | POA: Diagnosis not present

## 2019-04-14 DIAGNOSIS — J449 Chronic obstructive pulmonary disease, unspecified: Secondary | ICD-10-CM | POA: Diagnosis not present

## 2019-04-14 DIAGNOSIS — I5042 Chronic combined systolic (congestive) and diastolic (congestive) heart failure: Secondary | ICD-10-CM | POA: Diagnosis not present

## 2019-04-14 DIAGNOSIS — I272 Pulmonary hypertension, unspecified: Secondary | ICD-10-CM | POA: Diagnosis not present

## 2019-04-14 DIAGNOSIS — G4733 Obstructive sleep apnea (adult) (pediatric): Secondary | ICD-10-CM | POA: Diagnosis not present

## 2019-04-21 DIAGNOSIS — G4733 Obstructive sleep apnea (adult) (pediatric): Secondary | ICD-10-CM | POA: Diagnosis not present

## 2019-04-26 ENCOUNTER — Ambulatory Visit: Payer: Medicare Other | Admitting: Cardiology

## 2019-04-26 DIAGNOSIS — R200 Anesthesia of skin: Secondary | ICD-10-CM | POA: Diagnosis not present

## 2019-04-26 DIAGNOSIS — E1122 Type 2 diabetes mellitus with diabetic chronic kidney disease: Secondary | ICD-10-CM | POA: Diagnosis not present

## 2019-04-26 DIAGNOSIS — N189 Chronic kidney disease, unspecified: Secondary | ICD-10-CM | POA: Diagnosis not present

## 2019-04-26 DIAGNOSIS — R252 Cramp and spasm: Secondary | ICD-10-CM | POA: Diagnosis not present

## 2019-04-28 DIAGNOSIS — G5621 Lesion of ulnar nerve, right upper limb: Secondary | ICD-10-CM | POA: Diagnosis not present

## 2019-05-04 ENCOUNTER — Encounter: Payer: Self-pay | Admitting: Podiatry

## 2019-05-04 ENCOUNTER — Other Ambulatory Visit: Payer: Self-pay

## 2019-05-04 ENCOUNTER — Ambulatory Visit (INDEPENDENT_AMBULATORY_CARE_PROVIDER_SITE_OTHER): Payer: Medicare Other | Admitting: Podiatry

## 2019-05-04 VITALS — BP 111/55 | HR 74 | Temp 96.5°F | Resp 16

## 2019-05-04 DIAGNOSIS — E1142 Type 2 diabetes mellitus with diabetic polyneuropathy: Secondary | ICD-10-CM | POA: Diagnosis not present

## 2019-05-04 DIAGNOSIS — E1169 Type 2 diabetes mellitus with other specified complication: Secondary | ICD-10-CM | POA: Diagnosis not present

## 2019-05-04 DIAGNOSIS — Z89432 Acquired absence of left foot: Secondary | ICD-10-CM

## 2019-05-04 DIAGNOSIS — B351 Tinea unguium: Secondary | ICD-10-CM | POA: Diagnosis not present

## 2019-05-04 DIAGNOSIS — L84 Corns and callosities: Secondary | ICD-10-CM

## 2019-05-04 NOTE — Progress Notes (Signed)
Subjective:  Patient ID: [name], male    DOB: [birth year],  MRN: [id number]  Chief Complaint  Patient presents with  . debride    DIabetic nail trimming  . Diabetes    FBS: 136 A1C: 7 PCP: [facility] x 2 wks    78 y.o. male presents  for diabetic foot care. Last AMBS was 136. Reports numbness and tingling in their feet. Denies cramping in legs and thighs.  Review of Systems: Negative except as noted in the HPI. Denies N/V/F/Ch.  Past Medical History:  Diagnosis Date  . Arthritis   . CAD (coronary artery disease)   . Carotid artery occlusion   . Diabetes mellitus Age 11  . Hyperlipidemia   . Hypertension   . Joint pain   . Peripheral vascular disease ([place])   . Ulcer     Current Outpatient Medications:  .  aspirin EC 81 MG tablet, Take 2 tablets (162 mg total) by mouth daily., Disp: 90 tablet, Rfl: 1 .  Blood Glucose Monitoring Suppl (ACCU-CHEK GUIDE) w/Device KIT, daily., Disp: , Rfl:  .  ezetimibe (ZETIA) 10 MG tablet, Take 1 tablet (10 mg total) by mouth daily., Disp: 90 tablet, Rfl: 3 .  furosemide (LASIX) 40 MG tablet, Take 1 tablet (40 mg total) by mouth daily., Disp: 90 tablet, Rfl: 1 .  ketoconazole (NIZORAL) 2 % cream, Apply 1 fingertip amount to each foot daily., Disp: 30 g, Rfl: 0 .  linagliptin (TRADJENTA) 5 MG TABS tablet, Take 5 mg by mouth., Disp: , Rfl:  .  metoprolol tartrate (LOPRESSOR) 25 MG tablet, Take 0.5 tablets by mouth 2 (two) times daily., Disp: , Rfl:  .  pioglitazone (ACTOS) 30 MG tablet, Take 30 mg by mouth daily., Disp: , Rfl:  .  rOPINIRole (REQUIP) 0.5 MG tablet, TAKE 1 TABLET BY MOUTH 1 2 HOURS BEFORE BEDTIME, Disp: , Rfl:  .  rosuvastatin (CRESTOR) 40 MG tablet, Take 20 mg by mouth daily. , Disp: , Rfl:  .  spironolactone (ALDACTONE) 25 MG tablet, Take 25 mg by mouth daily., Disp: , Rfl:  .  SYMBICORT 160-4.5 MCG/ACT inhaler, , Disp: , Rfl:  .  Vitamin D, Ergocalciferol, (DRISDOL) 50000 units CAPS capsule, Take 50,000 Units by mouth every 7  (seven) days. Takes on Monday, Disp: , Rfl:  .  vitamin E 400 UNIT capsule, Take 400 Units by mouth daily., Disp: , Rfl:  .  nitroGLYCERIN (NITROSTAT) 0.4 MG SL tablet, Place 1 tablet (0.4 mg total) under the tongue every 5 (five) minutes as needed for chest pain., Disp: 25 tablet, Rfl: 11  Social History   Tobacco Use  Smoking Status Former Smoker  . Quit date: 11/04/1978  . Years since quitting: 40.5  Smokeless Tobacco Never Used    No Known Allergies Objective:   Vitals:   05/04/19 0908  BP: (!) 111/55  Pulse: 74  Resp: 16  Temp: (!) 96.5 F (35.8 C)   There is no height or weight on file to calculate BMI. Constitutional Well developed. Well nourished.  Vascular Dorsalis pedis pulses present 1+ bilaterally  Posterior tibial pulses present 1+ bilaterally  Pedal hair growth diminished. Capillary refill normal to all digits.  No cyanosis or clubbing noted.  Neurologic Normal speech. Oriented to person, place, and time. Epicritic sensation to light touch grossly present bilaterally. Protective sensation with 5.07 monofilament  absent bilaterally.  Dermatologic Nails elongated, thickened, dystrophic right foot. No open wounds. HPK R 5th MPJ Xerosis with scaling plantar foot  bilat.  Orthopedic: Normal joint ROM without pain or crepitus bilaterally. Left transmetatarsal amputation noted.   Assessment:   1. Onychomycosis of multiple toenails with type 2 diabetes mellitus and peripheral neuropathy ([place])   2. History of transmetatarsal amputation of left foot ([facility])   3. Callus of foot    Plan:  Patient was evaluated and treated and all questions answered.  Diabetes with Amputation Hx, Onychomycosis -Diabetic risk level 3 -Nails x5 debrided as below -Callus debrided  Procedure: Nail Debridement Rationale: Patient meets criteria for routine foot care due to amp hx Type of Debridement: manual, sharp debridement. Instrumentation: Nail nipper, rotary burr. Number of  Nails: 5   Procedure: Paring of Lesion Rationale: painful hyperkeratotic lesion Type of Debridement: manual, sharp debridement. Instrumentation: 312 blade Number of Lesions: 1   Return in about 9 weeks (around 07/06/2019) for Diabetic Foot Care.

## 2019-05-10 DIAGNOSIS — I2510 Atherosclerotic heart disease of native coronary artery without angina pectoris: Secondary | ICD-10-CM | POA: Diagnosis not present

## 2019-05-10 DIAGNOSIS — I4821 Permanent atrial fibrillation: Secondary | ICD-10-CM | POA: Insufficient documentation

## 2019-05-10 DIAGNOSIS — R0600 Dyspnea, unspecified: Secondary | ICD-10-CM | POA: Diagnosis not present

## 2019-05-10 DIAGNOSIS — I129 Hypertensive chronic kidney disease with stage 1 through stage 4 chronic kidney disease, or unspecified chronic kidney disease: Secondary | ICD-10-CM | POA: Diagnosis not present

## 2019-05-10 DIAGNOSIS — E785 Hyperlipidemia, unspecified: Secondary | ICD-10-CM | POA: Diagnosis not present

## 2019-05-10 DIAGNOSIS — N183 Chronic kidney disease, stage 3 (moderate): Secondary | ICD-10-CM | POA: Diagnosis not present

## 2019-05-10 DIAGNOSIS — Z951 Presence of aortocoronary bypass graft: Secondary | ICD-10-CM | POA: Diagnosis not present

## 2019-05-17 DIAGNOSIS — E1122 Type 2 diabetes mellitus with diabetic chronic kidney disease: Secondary | ICD-10-CM | POA: Diagnosis not present

## 2019-05-25 DIAGNOSIS — G5603 Carpal tunnel syndrome, bilateral upper limbs: Secondary | ICD-10-CM | POA: Diagnosis not present

## 2019-06-22 DIAGNOSIS — R5383 Other fatigue: Secondary | ICD-10-CM | POA: Diagnosis not present

## 2019-06-22 DIAGNOSIS — J449 Chronic obstructive pulmonary disease, unspecified: Secondary | ICD-10-CM | POA: Diagnosis not present

## 2019-06-22 DIAGNOSIS — I5042 Chronic combined systolic (congestive) and diastolic (congestive) heart failure: Secondary | ICD-10-CM | POA: Diagnosis not present

## 2019-06-22 DIAGNOSIS — I272 Pulmonary hypertension, unspecified: Secondary | ICD-10-CM | POA: Diagnosis not present

## 2019-06-22 DIAGNOSIS — G4733 Obstructive sleep apnea (adult) (pediatric): Secondary | ICD-10-CM | POA: Diagnosis not present

## 2019-06-23 DIAGNOSIS — N183 Chronic kidney disease, stage 3 (moderate): Secondary | ICD-10-CM | POA: Diagnosis not present

## 2019-06-28 DIAGNOSIS — E669 Obesity, unspecified: Secondary | ICD-10-CM | POA: Diagnosis not present

## 2019-06-28 DIAGNOSIS — I129 Hypertensive chronic kidney disease with stage 1 through stage 4 chronic kidney disease, or unspecified chronic kidney disease: Secondary | ICD-10-CM | POA: Diagnosis not present

## 2019-06-28 DIAGNOSIS — E1122 Type 2 diabetes mellitus with diabetic chronic kidney disease: Secondary | ICD-10-CM | POA: Diagnosis not present

## 2019-06-28 DIAGNOSIS — D649 Anemia, unspecified: Secondary | ICD-10-CM | POA: Diagnosis not present

## 2019-06-28 DIAGNOSIS — N183 Chronic kidney disease, stage 3 (moderate): Secondary | ICD-10-CM | POA: Diagnosis not present

## 2019-07-06 ENCOUNTER — Other Ambulatory Visit: Payer: Self-pay

## 2019-07-06 ENCOUNTER — Encounter: Payer: Self-pay | Admitting: Podiatry

## 2019-07-06 ENCOUNTER — Ambulatory Visit (INDEPENDENT_AMBULATORY_CARE_PROVIDER_SITE_OTHER): Payer: Medicare Other | Admitting: Podiatry

## 2019-07-06 VITALS — Temp 97.2°F | Resp 16

## 2019-07-06 DIAGNOSIS — E1169 Type 2 diabetes mellitus with other specified complication: Secondary | ICD-10-CM | POA: Diagnosis not present

## 2019-07-06 DIAGNOSIS — J449 Chronic obstructive pulmonary disease, unspecified: Secondary | ICD-10-CM | POA: Diagnosis not present

## 2019-07-06 DIAGNOSIS — G4733 Obstructive sleep apnea (adult) (pediatric): Secondary | ICD-10-CM | POA: Diagnosis not present

## 2019-07-06 DIAGNOSIS — B351 Tinea unguium: Secondary | ICD-10-CM

## 2019-07-06 DIAGNOSIS — Z89432 Acquired absence of left foot: Secondary | ICD-10-CM

## 2019-07-06 DIAGNOSIS — I272 Pulmonary hypertension, unspecified: Secondary | ICD-10-CM | POA: Diagnosis not present

## 2019-07-06 DIAGNOSIS — R5383 Other fatigue: Secondary | ICD-10-CM | POA: Diagnosis not present

## 2019-07-06 DIAGNOSIS — I5042 Chronic combined systolic (congestive) and diastolic (congestive) heart failure: Secondary | ICD-10-CM | POA: Diagnosis not present

## 2019-07-06 DIAGNOSIS — E1142 Type 2 diabetes mellitus with diabetic polyneuropathy: Secondary | ICD-10-CM | POA: Diagnosis not present

## 2019-07-06 NOTE — Progress Notes (Signed)
Subjective:  Patient ID: [name], male    DOB: [birth year],  MRN: [id number]  Chief Complaint  Patient presents with  . debride    Diabetic foot care and nail trimming   . Diabetes    FBS: 132 A1C: unkown PCP: [facility] x 4 wks -pt denies any other pedal issues -pt denies N/V/F/Ch     78 y.o. male presents  for diabetic foot care. Last AMBS was 132. Reports numbness and tingling in their feet. Denies cramping in legs and thighs.  Review of Systems: Negative except as noted in the HPI. Denies N/V/F/Ch.  Past Medical History:  Diagnosis Date  . Arthritis   . CAD (coronary artery disease)   . Carotid artery occlusion   . Diabetes mellitus Age 23  . Hyperlipidemia   . Hypertension   . Joint pain   . Peripheral vascular disease ([place])   . Ulcer     Current Outpatient Medications:  .  dabigatran (PRADAXA) 75 MG CAPS capsule, Take by mouth., Disp: , Rfl:  .  aspirin EC 81 MG tablet, Take 2 tablets (162 mg total) by mouth daily., Disp: 90 tablet, Rfl: 1 .  Blood Glucose Monitoring Suppl (ACCU-CHEK GUIDE) w/Device KIT, daily., Disp: , Rfl:  .  ezetimibe (ZETIA) 10 MG tablet, Take 1 tablet (10 mg total) by mouth daily., Disp: 90 tablet, Rfl: 3 .  Fluticasone-Umeclidin-Vilant (TRELEGY ELLIPTA) 100-62.5-25 MCG/INH AEPB, Inhale into the lungs., Disp: , Rfl:  .  furosemide (LASIX) 40 MG tablet, Take 1 tablet (40 mg total) by mouth daily., Disp: 90 tablet, Rfl: 1 .  ketoconazole (NIZORAL) 2 % cream, Apply 1 fingertip amount to each foot daily., Disp: 30 g, Rfl: 0 .  levofloxacin (LEVAQUIN) 750 MG tablet, , Disp: , Rfl:  .  linagliptin (TRADJENTA) 5 MG TABS tablet, Take 5 mg by mouth., Disp: , Rfl:  .  metoprolol tartrate (LOPRESSOR) 25 MG tablet, Take 0.5 tablets by mouth 2 (two) times daily., Disp: , Rfl:  .  nitroGLYCERIN (NITROSTAT) 0.4 MG SL tablet, Place 1 tablet (0.4 mg total) under the tongue every 5 (five) minutes as needed for chest pain., Disp: 25 tablet, Rfl: 11 .  pioglitazone  (ACTOS) 30 MG tablet, Take 30 mg by mouth daily., Disp: , Rfl:  .  rOPINIRole (REQUIP) 0.5 MG tablet, TAKE 1 TABLET BY MOUTH 1 2 HOURS BEFORE BEDTIME, Disp: , Rfl:  .  rosuvastatin (CRESTOR) 40 MG tablet, Take 20 mg by mouth daily. , Disp: , Rfl:  .  spironolactone (ALDACTONE) 25 MG tablet, Take 25 mg by mouth daily., Disp: , Rfl:  .  SYMBICORT 160-4.5 MCG/ACT inhaler, , Disp: , Rfl:  .  Vitamin D, Ergocalciferol, (DRISDOL) 50000 units CAPS capsule, Take 50,000 Units by mouth every 7 (seven) days. Takes on Monday, Disp: , Rfl:  .  vitamin E 400 UNIT capsule, Take 400 Units by mouth daily., Disp: , Rfl:   Social History   Tobacco Use  Smoking Status Former Smoker  . Quit date: 11/04/1978  . Years since quitting: 40.6  Smokeless Tobacco Never Used    No Known Allergies Objective:   Vitals:   07/06/19 0810  Resp: 16  Temp: (!) 97.2 F (36.2 C)   There is no height or weight on file to calculate BMI. Constitutional Well developed. Well nourished.  Vascular Dorsalis pedis pulses present 1+ bilaterally  Posterior tibial pulses present 1+ bilaterally  Pedal hair growth diminished. Capillary refill normal to all digits.  No  cyanosis or clubbing noted.  Neurologic Normal speech. Oriented to person, place, and time. Epicritic sensation to light touch grossly present bilaterally. Protective sensation with 5.07 monofilament  absent bilaterally.  Dermatologic Nails elongated, thickened, dystrophic right foot. No open wounds. HPK R 5th MPJ Xerosis with scaling plantar foot bilat.  Orthopedic: Normal joint ROM without pain or crepitus bilaterally. Left transmetatarsal amputation noted.   Assessment:   1. Onychomycosis of multiple toenails with type 2 diabetes mellitus and peripheral neuropathy ([place])   2. History of transmetatarsal amputation of left foot ([place])    Plan:  Patient was evaluated and treated and all questions answered.  Diabetes with Amputation Hx, Onychomycosis  -Diabetic risk level 3 -Nails debrided as below  Procedure: Nail Debridement Rationale: Patient meets criteria for routine foot care due to amputation hx Type of Debridement: manual, sharp debridement. Instrumentation: Nail nipper, rotary burr. Number of Nails: 5     Return in about 9 weeks (around 09/07/2019).

## 2019-07-13 DIAGNOSIS — I2510 Atherosclerotic heart disease of native coronary artery without angina pectoris: Secondary | ICD-10-CM | POA: Diagnosis not present

## 2019-07-13 DIAGNOSIS — I700 Atherosclerosis of aorta: Secondary | ICD-10-CM | POA: Diagnosis not present

## 2019-07-13 DIAGNOSIS — J181 Lobar pneumonia, unspecified organism: Secondary | ICD-10-CM | POA: Diagnosis not present

## 2019-07-13 DIAGNOSIS — K8020 Calculus of gallbladder without cholecystitis without obstruction: Secondary | ICD-10-CM | POA: Diagnosis not present

## 2019-07-13 DIAGNOSIS — I272 Pulmonary hypertension, unspecified: Secondary | ICD-10-CM | POA: Diagnosis not present

## 2019-07-13 DIAGNOSIS — J432 Centrilobular emphysema: Secondary | ICD-10-CM | POA: Diagnosis not present

## 2019-07-13 DIAGNOSIS — J9811 Atelectasis: Secondary | ICD-10-CM | POA: Diagnosis not present

## 2019-07-13 DIAGNOSIS — Z951 Presence of aortocoronary bypass graft: Secondary | ICD-10-CM | POA: Diagnosis not present

## 2019-07-13 DIAGNOSIS — R918 Other nonspecific abnormal finding of lung field: Secondary | ICD-10-CM | POA: Diagnosis not present

## 2019-07-13 DIAGNOSIS — J90 Pleural effusion, not elsewhere classified: Secondary | ICD-10-CM | POA: Diagnosis not present

## 2019-07-27 DIAGNOSIS — R918 Other nonspecific abnormal finding of lung field: Secondary | ICD-10-CM | POA: Diagnosis not present

## 2019-07-27 DIAGNOSIS — J84112 Idiopathic pulmonary fibrosis: Secondary | ICD-10-CM | POA: Diagnosis not present

## 2019-07-27 DIAGNOSIS — I5042 Chronic combined systolic (congestive) and diastolic (congestive) heart failure: Secondary | ICD-10-CM | POA: Diagnosis not present

## 2019-07-27 DIAGNOSIS — G4733 Obstructive sleep apnea (adult) (pediatric): Secondary | ICD-10-CM | POA: Diagnosis not present

## 2019-07-27 DIAGNOSIS — J449 Chronic obstructive pulmonary disease, unspecified: Secondary | ICD-10-CM | POA: Diagnosis not present

## 2019-07-27 DIAGNOSIS — R5383 Other fatigue: Secondary | ICD-10-CM | POA: Diagnosis not present

## 2019-07-27 DIAGNOSIS — I2729 Other secondary pulmonary hypertension: Secondary | ICD-10-CM | POA: Diagnosis not present

## 2019-08-02 DIAGNOSIS — I10 Essential (primary) hypertension: Secondary | ICD-10-CM | POA: Diagnosis not present

## 2019-08-02 DIAGNOSIS — E785 Hyperlipidemia, unspecified: Secondary | ICD-10-CM | POA: Diagnosis not present

## 2019-08-02 DIAGNOSIS — Z951 Presence of aortocoronary bypass graft: Secondary | ICD-10-CM | POA: Diagnosis not present

## 2019-08-02 DIAGNOSIS — I2510 Atherosclerotic heart disease of native coronary artery without angina pectoris: Secondary | ICD-10-CM | POA: Diagnosis not present

## 2019-08-02 DIAGNOSIS — I4821 Permanent atrial fibrillation: Secondary | ICD-10-CM | POA: Diagnosis not present

## 2019-08-02 DIAGNOSIS — Z7901 Long term (current) use of anticoagulants: Secondary | ICD-10-CM | POA: Diagnosis not present

## 2019-08-16 DIAGNOSIS — N189 Chronic kidney disease, unspecified: Secondary | ICD-10-CM | POA: Diagnosis not present

## 2019-08-16 DIAGNOSIS — E785 Hyperlipidemia, unspecified: Secondary | ICD-10-CM | POA: Diagnosis not present

## 2019-08-16 DIAGNOSIS — Z125 Encounter for screening for malignant neoplasm of prostate: Secondary | ICD-10-CM | POA: Diagnosis not present

## 2019-08-16 DIAGNOSIS — E1122 Type 2 diabetes mellitus with diabetic chronic kidney disease: Secondary | ICD-10-CM | POA: Diagnosis not present

## 2019-08-16 DIAGNOSIS — E559 Vitamin D deficiency, unspecified: Secondary | ICD-10-CM | POA: Diagnosis not present

## 2019-08-16 DIAGNOSIS — Z23 Encounter for immunization: Secondary | ICD-10-CM | POA: Diagnosis not present

## 2019-08-16 DIAGNOSIS — I1310 Hypertensive heart and chronic kidney disease without heart failure, with stage 1 through stage 4 chronic kidney disease, or unspecified chronic kidney disease: Secondary | ICD-10-CM | POA: Diagnosis not present

## 2019-09-07 ENCOUNTER — Ambulatory Visit: Payer: Medicare Other | Admitting: Podiatry

## 2019-09-21 ENCOUNTER — Other Ambulatory Visit: Payer: Self-pay

## 2019-09-21 ENCOUNTER — Ambulatory Visit (INDEPENDENT_AMBULATORY_CARE_PROVIDER_SITE_OTHER): Payer: Medicare Other | Admitting: Podiatry

## 2019-09-21 DIAGNOSIS — E1169 Type 2 diabetes mellitus with other specified complication: Secondary | ICD-10-CM

## 2019-09-21 DIAGNOSIS — B351 Tinea unguium: Secondary | ICD-10-CM | POA: Diagnosis not present

## 2019-09-21 DIAGNOSIS — E1142 Type 2 diabetes mellitus with diabetic polyneuropathy: Secondary | ICD-10-CM

## 2019-09-27 DIAGNOSIS — R918 Other nonspecific abnormal finding of lung field: Secondary | ICD-10-CM | POA: Diagnosis not present

## 2019-09-27 DIAGNOSIS — I272 Pulmonary hypertension, unspecified: Secondary | ICD-10-CM | POA: Diagnosis not present

## 2019-09-27 DIAGNOSIS — I5042 Chronic combined systolic (congestive) and diastolic (congestive) heart failure: Secondary | ICD-10-CM | POA: Diagnosis not present

## 2019-09-27 DIAGNOSIS — R6889 Other general symptoms and signs: Secondary | ICD-10-CM | POA: Diagnosis not present

## 2019-09-27 DIAGNOSIS — Z20828 Contact with and (suspected) exposure to other viral communicable diseases: Secondary | ICD-10-CM | POA: Diagnosis not present

## 2019-09-27 DIAGNOSIS — J449 Chronic obstructive pulmonary disease, unspecified: Secondary | ICD-10-CM | POA: Diagnosis not present

## 2019-09-27 DIAGNOSIS — G4733 Obstructive sleep apnea (adult) (pediatric): Secondary | ICD-10-CM | POA: Diagnosis not present

## 2019-10-01 DIAGNOSIS — I110 Hypertensive heart disease with heart failure: Secondary | ICD-10-CM | POA: Diagnosis not present

## 2019-10-01 DIAGNOSIS — E119 Type 2 diabetes mellitus without complications: Secondary | ICD-10-CM | POA: Diagnosis not present

## 2019-10-01 DIAGNOSIS — Z87891 Personal history of nicotine dependence: Secondary | ICD-10-CM | POA: Diagnosis not present

## 2019-10-01 DIAGNOSIS — R918 Other nonspecific abnormal finding of lung field: Secondary | ICD-10-CM | POA: Diagnosis not present

## 2019-10-01 DIAGNOSIS — I4821 Permanent atrial fibrillation: Secondary | ICD-10-CM | POA: Diagnosis not present

## 2019-10-01 DIAGNOSIS — E1151 Type 2 diabetes mellitus with diabetic peripheral angiopathy without gangrene: Secondary | ICD-10-CM | POA: Diagnosis present

## 2019-10-01 DIAGNOSIS — J151 Pneumonia due to Pseudomonas: Secondary | ICD-10-CM | POA: Diagnosis present

## 2019-10-01 DIAGNOSIS — I5031 Acute diastolic (congestive) heart failure: Secondary | ICD-10-CM | POA: Diagnosis not present

## 2019-10-01 DIAGNOSIS — Z4682 Encounter for fitting and adjustment of non-vascular catheter: Secondary | ICD-10-CM | POA: Diagnosis not present

## 2019-10-01 DIAGNOSIS — D631 Anemia in chronic kidney disease: Secondary | ICD-10-CM | POA: Diagnosis present

## 2019-10-01 DIAGNOSIS — E1165 Type 2 diabetes mellitus with hyperglycemia: Secondary | ICD-10-CM | POA: Diagnosis present

## 2019-10-01 DIAGNOSIS — R0602 Shortness of breath: Secondary | ICD-10-CM | POA: Diagnosis not present

## 2019-10-01 DIAGNOSIS — I130 Hypertensive heart and chronic kidney disease with heart failure and stage 1 through stage 4 chronic kidney disease, or unspecified chronic kidney disease: Secondary | ICD-10-CM | POA: Diagnosis present

## 2019-10-01 DIAGNOSIS — J9621 Acute and chronic respiratory failure with hypoxia: Secondary | ICD-10-CM | POA: Diagnosis not present

## 2019-10-01 DIAGNOSIS — J90 Pleural effusion, not elsewhere classified: Secondary | ICD-10-CM | POA: Diagnosis not present

## 2019-10-01 DIAGNOSIS — R05 Cough: Secondary | ICD-10-CM | POA: Diagnosis not present

## 2019-10-01 DIAGNOSIS — I469 Cardiac arrest, cause unspecified: Secondary | ICD-10-CM | POA: Diagnosis not present

## 2019-10-01 DIAGNOSIS — R12 Heartburn: Secondary | ICD-10-CM | POA: Diagnosis not present

## 2019-10-01 DIAGNOSIS — J811 Chronic pulmonary edema: Secondary | ICD-10-CM | POA: Diagnosis not present

## 2019-10-01 DIAGNOSIS — E440 Moderate protein-calorie malnutrition: Secondary | ICD-10-CM | POA: Diagnosis not present

## 2019-10-01 DIAGNOSIS — E875 Hyperkalemia: Secondary | ICD-10-CM | POA: Diagnosis present

## 2019-10-01 DIAGNOSIS — E1122 Type 2 diabetes mellitus with diabetic chronic kidney disease: Secondary | ICD-10-CM | POA: Diagnosis present

## 2019-10-01 DIAGNOSIS — J1289 Other viral pneumonia: Secondary | ICD-10-CM | POA: Diagnosis not present

## 2019-10-01 DIAGNOSIS — I4901 Ventricular fibrillation: Secondary | ICD-10-CM | POA: Diagnosis not present

## 2019-10-01 DIAGNOSIS — I129 Hypertensive chronic kidney disease with stage 1 through stage 4 chronic kidney disease, or unspecified chronic kidney disease: Secondary | ICD-10-CM | POA: Diagnosis not present

## 2019-10-01 DIAGNOSIS — J440 Chronic obstructive pulmonary disease with acute lower respiratory infection: Secondary | ICD-10-CM | POA: Diagnosis present

## 2019-10-01 DIAGNOSIS — N183 Chronic kidney disease, stage 3 unspecified: Secondary | ICD-10-CM | POA: Diagnosis not present

## 2019-10-01 DIAGNOSIS — Z9981 Dependence on supplemental oxygen: Secondary | ICD-10-CM | POA: Diagnosis not present

## 2019-10-01 DIAGNOSIS — I517 Cardiomegaly: Secondary | ICD-10-CM | POA: Diagnosis not present

## 2019-10-01 DIAGNOSIS — I509 Heart failure, unspecified: Secondary | ICD-10-CM | POA: Diagnosis not present

## 2019-10-01 DIAGNOSIS — Z7901 Long term (current) use of anticoagulants: Secondary | ICD-10-CM | POA: Diagnosis not present

## 2019-10-01 DIAGNOSIS — R0682 Tachypnea, not elsewhere classified: Secondary | ICD-10-CM | POA: Diagnosis not present

## 2019-10-01 DIAGNOSIS — E785 Hyperlipidemia, unspecified: Secondary | ICD-10-CM | POA: Diagnosis not present

## 2019-10-01 DIAGNOSIS — J9690 Respiratory failure, unspecified, unspecified whether with hypoxia or hypercapnia: Secondary | ICD-10-CM | POA: Diagnosis not present

## 2019-10-01 DIAGNOSIS — R0902 Hypoxemia: Secondary | ICD-10-CM | POA: Diagnosis not present

## 2019-10-01 DIAGNOSIS — U071 COVID-19: Secondary | ICD-10-CM | POA: Diagnosis not present

## 2019-10-01 DIAGNOSIS — J449 Chronic obstructive pulmonary disease, unspecified: Secondary | ICD-10-CM | POA: Diagnosis not present

## 2019-10-01 DIAGNOSIS — E1159 Type 2 diabetes mellitus with other circulatory complications: Secondary | ICD-10-CM | POA: Diagnosis not present

## 2019-10-01 DIAGNOSIS — R7881 Bacteremia: Secondary | ICD-10-CM | POA: Diagnosis not present

## 2019-10-01 DIAGNOSIS — E088 Diabetes mellitus due to underlying condition with unspecified complications: Secondary | ICD-10-CM | POA: Diagnosis not present

## 2019-10-01 DIAGNOSIS — I2510 Atherosclerotic heart disease of native coronary artery without angina pectoris: Secondary | ICD-10-CM | POA: Diagnosis not present

## 2019-10-01 DIAGNOSIS — I4891 Unspecified atrial fibrillation: Secondary | ICD-10-CM | POA: Diagnosis not present

## 2019-10-01 DIAGNOSIS — I5033 Acute on chronic diastolic (congestive) heart failure: Secondary | ICD-10-CM | POA: Diagnosis present

## 2019-10-01 DIAGNOSIS — J9601 Acute respiratory failure with hypoxia: Secondary | ICD-10-CM | POA: Diagnosis not present

## 2019-10-17 NOTE — Progress Notes (Signed)
Subjective:  Patient ID: [name], male    DOB: 04-03-1941,  MRN: [id number]  Chief Complaint  Patient presents with  . debride    diabetic nail trimming  . Diabetes    FBS: 136 A1C: na PCP: [facility] x 3 wks    78 y.o. male presents  for diabetic foot care. Last AMBS was 132. Reports numbness and tingling in their feet. Denies cramping in legs and thighs.  Review of Systems: Negative except as noted in the HPI. Denies N/V/F/Ch.  Past Medical History:  Diagnosis Date  . Arthritis   . CAD (coronary artery disease)   . Carotid artery occlusion   . Diabetes mellitus Age 59  . Hyperlipidemia   . Hypertension   . Joint pain   . Peripheral vascular disease ([place])   . Ulcer     Current Outpatient Medications:  .  aspirin EC 81 MG tablet, Take 2 tablets (162 mg total) by mouth daily., Disp: 90 tablet, Rfl: 1 .  Blood Glucose Monitoring Suppl (ACCU-CHEK GUIDE) w/Device KIT, daily., Disp: , Rfl:  .  dabigatran (PRADAXA) 75 MG CAPS capsule, Take by mouth., Disp: , Rfl:  .  ezetimibe (ZETIA) 10 MG tablet, Take 1 tablet (10 mg total) by mouth daily., Disp: 90 tablet, Rfl: 3 .  Fluticasone-Umeclidin-Vilant (TRELEGY ELLIPTA) 100-62.5-25 MCG/INH AEPB, Inhale into the lungs., Disp: , Rfl:  .  furosemide (LASIX) 40 MG tablet, Take 1 tablet (40 mg total) by mouth daily., Disp: 90 tablet, Rfl: 1 .  ketoconazole (NIZORAL) 2 % cream, Apply 1 fingertip amount to each foot daily., Disp: 30 g, Rfl: 0 .  levofloxacin (LEVAQUIN) 750 MG tablet, , Disp: , Rfl:  .  linagliptin (TRADJENTA) 5 MG TABS tablet, Take 5 mg by mouth., Disp: , Rfl:  .  metoprolol tartrate (LOPRESSOR) 25 MG tablet, Take 0.5 tablets by mouth 2 (two) times daily., Disp: , Rfl:  .  nitroGLYCERIN (NITROSTAT) 0.4 MG SL tablet, Place 1 tablet (0.4 mg total) under the tongue every 5 (five) minutes as needed for chest pain., Disp: 25 tablet, Rfl: 11 .  pioglitazone (ACTOS) 30 MG tablet, Take 30 mg by mouth daily., Disp: , Rfl:  .   rOPINIRole (REQUIP) 0.5 MG tablet, TAKE 1 TABLET BY MOUTH 1 2 HOURS BEFORE BEDTIME, Disp: , Rfl:  .  rosuvastatin (CRESTOR) 40 MG tablet, Take 20 mg by mouth daily. , Disp: , Rfl:  .  spironolactone (ALDACTONE) 25 MG tablet, Take 25 mg by mouth daily., Disp: , Rfl:  .  SYMBICORT 160-4.5 MCG/ACT inhaler, , Disp: , Rfl:  .  Vitamin D, Ergocalciferol, (DRISDOL) 50000 units CAPS capsule, Take 50,000 Units by mouth every 7 (seven) days. Takes on Monday, Disp: , Rfl:  .  vitamin E 400 UNIT capsule, Take 400 Units by mouth daily., Disp: , Rfl:   Social History   Tobacco Use  Smoking Status Former Smoker  . Quit date: 11/04/1978  . Years since quitting: 40.9  Smokeless Tobacco Never Used    No Known Allergies Objective:   There were no vitals filed for this visit. There is no height or weight on file to calculate BMI. Constitutional Well developed. Well nourished.  Vascular Dorsalis pedis pulses present 1+ bilaterally  Posterior tibial pulses present 1+ bilaterally  Pedal hair growth diminished. Capillary refill normal to all digits.  No cyanosis or clubbing noted.  Neurologic Normal speech. Oriented to person, place, and time. Epicritic sensation to light touch grossly present bilaterally. Protective  sensation with 5.07 monofilament  absent bilaterally.  Dermatologic Nails elongated, thickened, dystrophic right foot. No open wounds. HPK R 5th MPJ Xerosis with scaling plantar foot bilat.  Orthopedic: Normal joint ROM without pain or crepitus bilaterally. Left transmetatarsal amputation noted.   Assessment:   1. Onychomycosis of multiple toenails with type 2 diabetes mellitus and peripheral neuropathy ([place])    Plan:  Patient was evaluated and treated and all questions answered.  Diabetes with Amputation Hx, Onychomycosis -Diabetic risk level 3 -Nails debrided as below  Procedure: Nail Debridement Rationale: Patient meets criteria for routine foot care due to Amputation hx  Type of Debridement: manual, sharp debridement. Instrumentation: Nail nipper, rotary burr. Number of Nails: 5       Return in about 9 weeks (around 11/23/2019) for Diabetic Foot Care.

## 2019-11-05 DEATH — deceased

## 2019-11-23 ENCOUNTER — Ambulatory Visit: Payer: Medicare Other | Admitting: Podiatry

## 2019-11-25 ENCOUNTER — Telehealth: Payer: Self-pay | Admitting: Vascular Surgery

## 2019-11-25 NOTE — Telephone Encounter (Signed)
Patient wife called and states patient is deceased, died 10-25-2019
